# Patient Record
Sex: Female | Born: 1935 | Race: White | Hispanic: No | Smoking: Never smoker
Health system: Southern US, Community
[De-identification: ages and names within clinical notes are randomized; demographics above are authoritative.]

## PROBLEM LIST (undated history)

## (undated) DIAGNOSIS — C439 Malignant melanoma of skin, unspecified: Secondary | ICD-10-CM

## (undated) DIAGNOSIS — Z950 Presence of cardiac pacemaker: Secondary | ICD-10-CM

## (undated) DIAGNOSIS — K130 Diseases of lips: Secondary | ICD-10-CM

## (undated) DIAGNOSIS — M810 Age-related osteoporosis without current pathological fracture: Secondary | ICD-10-CM

## (undated) DIAGNOSIS — I499 Cardiac arrhythmia, unspecified: Secondary | ICD-10-CM

## (undated) DIAGNOSIS — L439 Lichen planus, unspecified: Secondary | ICD-10-CM

## (undated) DIAGNOSIS — F419 Anxiety disorder, unspecified: Secondary | ICD-10-CM

## (undated) DIAGNOSIS — I4892 Unspecified atrial flutter: Secondary | ICD-10-CM

## (undated) DIAGNOSIS — K219 Gastro-esophageal reflux disease without esophagitis: Secondary | ICD-10-CM

## (undated) DIAGNOSIS — R001 Bradycardia, unspecified: Secondary | ICD-10-CM

## (undated) DIAGNOSIS — I10 Essential (primary) hypertension: Secondary | ICD-10-CM

## (undated) HISTORY — DX: Malignant melanoma of skin, unspecified: C43.9

## (undated) HISTORY — DX: Lichen planus, unspecified: L43.9

## (undated) HISTORY — PX: TUBAL LIGATION: SHX77

## (undated) HISTORY — PX: OTHER SURGICAL HISTORY: SHX169

## (undated) HISTORY — DX: Bradycardia, unspecified: R00.1

## (undated) HISTORY — DX: Age-related osteoporosis without current pathological fracture: M81.0

## (undated) HISTORY — PX: HEMORROIDECTOMY: SUR656

## (undated) HISTORY — DX: Diseases of lips: K13.0

## (undated) HISTORY — PX: BACK SURGERY: SHX140

## (undated) HISTORY — PX: INSERT / REPLACE / REMOVE PACEMAKER: SUR710

## (undated) HISTORY — PX: TONSILLECTOMY: SUR1361

## (undated) HISTORY — DX: Essential (primary) hypertension: I10

## (undated) HISTORY — PX: EYE SURGERY: SHX253

## (undated) HISTORY — PX: FRACTURE SURGERY: SHX138

---
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
## 1978-04-07 HISTORY — PX: HERNIA REPAIR: SHX51

## 1996-04-07 HISTORY — PX: OTHER SURGICAL HISTORY: SHX169

## 1999-04-08 DIAGNOSIS — C439 Malignant melanoma of skin, unspecified: Secondary | ICD-10-CM

## 1999-04-08 HISTORY — DX: Malignant melanoma of skin, unspecified: C43.9

## 2004-01-17 ENCOUNTER — Ambulatory Visit: Payer: Self-pay | Admitting: Internal Medicine

## 2004-02-06 ENCOUNTER — Ambulatory Visit: Payer: Self-pay | Admitting: Gastroenterology

## 2004-02-07 ENCOUNTER — Ambulatory Visit: Payer: Self-pay | Admitting: Gastroenterology

## 2004-02-21 ENCOUNTER — Ambulatory Visit: Payer: Self-pay | Admitting: Gastroenterology

## 2004-04-05 ENCOUNTER — Ambulatory Visit: Payer: Self-pay | Admitting: Internal Medicine

## 2004-04-07 ENCOUNTER — Ambulatory Visit: Payer: Self-pay | Admitting: Internal Medicine

## 2004-05-08 ENCOUNTER — Ambulatory Visit: Payer: Self-pay | Admitting: Internal Medicine

## 2005-01-01 ENCOUNTER — Ambulatory Visit: Payer: Self-pay | Admitting: Internal Medicine

## 2005-04-07 DIAGNOSIS — R001 Bradycardia, unspecified: Secondary | ICD-10-CM

## 2005-04-07 HISTORY — PX: OTHER SURGICAL HISTORY: SHX169

## 2005-04-07 HISTORY — DX: Bradycardia, unspecified: R00.1

## 2005-04-07 HISTORY — PX: KYPHOPLASTY: SHX5884

## 2005-04-07 HISTORY — PX: PACEMAKER PLACEMENT: SHX43

## 2005-06-09 ENCOUNTER — Ambulatory Visit: Payer: Self-pay | Admitting: Unknown Physician Specialty

## 2005-06-10 ENCOUNTER — Other Ambulatory Visit: Payer: Self-pay

## 2005-06-11 ENCOUNTER — Inpatient Hospital Stay: Payer: Self-pay | Admitting: Unknown Physician Specialty

## 2005-10-03 ENCOUNTER — Ambulatory Visit: Payer: Self-pay | Admitting: Internal Medicine

## 2006-01-20 ENCOUNTER — Ambulatory Visit: Payer: Self-pay | Admitting: Internal Medicine

## 2006-01-21 ENCOUNTER — Ambulatory Visit: Payer: Self-pay | Admitting: Internal Medicine

## 2006-02-11 ENCOUNTER — Other Ambulatory Visit: Payer: Self-pay

## 2006-02-11 ENCOUNTER — Inpatient Hospital Stay: Payer: Self-pay | Admitting: Cardiology

## 2006-02-12 ENCOUNTER — Other Ambulatory Visit: Payer: Self-pay

## 2006-03-19 ENCOUNTER — Ambulatory Visit: Payer: Self-pay | Admitting: Internal Medicine

## 2006-05-05 ENCOUNTER — Ambulatory Visit: Payer: Self-pay | Admitting: Internal Medicine

## 2006-08-06 ENCOUNTER — Ambulatory Visit: Payer: Self-pay | Admitting: Oncology

## 2006-08-28 ENCOUNTER — Ambulatory Visit: Payer: Self-pay | Admitting: Oncology

## 2006-09-04 ENCOUNTER — Ambulatory Visit: Payer: Self-pay | Admitting: Oncology

## 2006-09-06 ENCOUNTER — Ambulatory Visit: Payer: Self-pay | Admitting: Oncology

## 2006-09-07 ENCOUNTER — Ambulatory Visit: Payer: Self-pay | Admitting: Oncology

## 2006-10-06 ENCOUNTER — Ambulatory Visit: Payer: Self-pay | Admitting: Oncology

## 2006-11-06 ENCOUNTER — Ambulatory Visit: Payer: Self-pay | Admitting: Oncology

## 2006-12-07 ENCOUNTER — Ambulatory Visit: Payer: Self-pay | Admitting: Oncology

## 2007-01-06 ENCOUNTER — Ambulatory Visit: Payer: Self-pay | Admitting: Oncology

## 2007-01-25 ENCOUNTER — Ambulatory Visit: Payer: Self-pay | Admitting: Internal Medicine

## 2007-02-01 ENCOUNTER — Ambulatory Visit: Payer: Self-pay | Admitting: Oncology

## 2007-02-06 ENCOUNTER — Ambulatory Visit: Payer: Self-pay | Admitting: Oncology

## 2007-02-18 ENCOUNTER — Ambulatory Visit: Payer: Self-pay | Admitting: Oncology

## 2007-03-08 ENCOUNTER — Ambulatory Visit: Payer: Self-pay | Admitting: Oncology

## 2007-03-18 ENCOUNTER — Ambulatory Visit: Payer: Self-pay | Admitting: Radiation Oncology

## 2007-04-08 ENCOUNTER — Ambulatory Visit: Payer: Self-pay | Admitting: Oncology

## 2007-04-08 HISTORY — PX: CHOLECYSTECTOMY: SHX55

## 2007-04-12 ENCOUNTER — Ambulatory Visit: Payer: Self-pay | Admitting: Oncology

## 2007-05-09 ENCOUNTER — Ambulatory Visit: Payer: Self-pay | Admitting: Oncology

## 2007-05-19 ENCOUNTER — Ambulatory Visit: Payer: Self-pay | Admitting: Oncology

## 2007-06-06 ENCOUNTER — Ambulatory Visit: Payer: Self-pay | Admitting: Oncology

## 2007-07-30 ENCOUNTER — Other Ambulatory Visit: Payer: Self-pay

## 2007-07-30 ENCOUNTER — Emergency Department: Payer: Self-pay | Admitting: Emergency Medicine

## 2007-08-06 ENCOUNTER — Ambulatory Visit: Payer: Self-pay | Admitting: Oncology

## 2007-09-01 ENCOUNTER — Ambulatory Visit: Payer: Self-pay | Admitting: Oncology

## 2007-09-06 ENCOUNTER — Ambulatory Visit: Payer: Self-pay | Admitting: Oncology

## 2007-10-06 ENCOUNTER — Ambulatory Visit: Payer: Self-pay | Admitting: Oncology

## 2007-10-14 ENCOUNTER — Inpatient Hospital Stay: Payer: Self-pay | Admitting: Specialist

## 2007-10-14 ENCOUNTER — Other Ambulatory Visit: Payer: Self-pay

## 2007-11-06 ENCOUNTER — Ambulatory Visit: Payer: Self-pay | Admitting: Oncology

## 2007-12-02 ENCOUNTER — Ambulatory Visit: Payer: Self-pay | Admitting: Oncology

## 2007-12-07 ENCOUNTER — Ambulatory Visit: Payer: Self-pay | Admitting: Oncology

## 2008-01-26 ENCOUNTER — Ambulatory Visit: Payer: Self-pay | Admitting: Internal Medicine

## 2008-06-01 ENCOUNTER — Ambulatory Visit: Payer: Self-pay | Admitting: Oncology

## 2008-06-05 ENCOUNTER — Ambulatory Visit: Payer: Self-pay | Admitting: Oncology

## 2008-07-06 ENCOUNTER — Ambulatory Visit: Payer: Self-pay | Admitting: Oncology

## 2008-07-12 ENCOUNTER — Ambulatory Visit: Payer: Self-pay | Admitting: Oncology

## 2008-08-05 ENCOUNTER — Ambulatory Visit: Payer: Self-pay | Admitting: Oncology

## 2008-09-05 ENCOUNTER — Ambulatory Visit: Payer: Self-pay | Admitting: Radiation Oncology

## 2008-10-02 ENCOUNTER — Ambulatory Visit: Payer: Self-pay | Admitting: Oncology

## 2008-10-05 ENCOUNTER — Ambulatory Visit: Payer: Self-pay | Admitting: Radiation Oncology

## 2008-11-05 ENCOUNTER — Ambulatory Visit: Payer: Self-pay | Admitting: Oncology

## 2008-12-01 ENCOUNTER — Ambulatory Visit: Payer: Self-pay | Admitting: Oncology

## 2008-12-06 ENCOUNTER — Ambulatory Visit: Payer: Self-pay | Admitting: Oncology

## 2009-01-29 ENCOUNTER — Ambulatory Visit: Payer: Self-pay | Admitting: Internal Medicine

## 2009-03-08 ENCOUNTER — Encounter: Payer: Self-pay | Admitting: Internal Medicine

## 2009-04-07 ENCOUNTER — Ambulatory Visit: Payer: Self-pay | Admitting: Oncology

## 2009-04-11 ENCOUNTER — Encounter: Payer: Self-pay | Admitting: Internal Medicine

## 2009-04-26 ENCOUNTER — Ambulatory Visit: Payer: Self-pay | Admitting: Oncology

## 2009-05-08 ENCOUNTER — Ambulatory Visit: Payer: Self-pay | Admitting: Oncology

## 2009-05-08 ENCOUNTER — Encounter: Payer: Self-pay | Admitting: Internal Medicine

## 2009-10-05 ENCOUNTER — Ambulatory Visit: Payer: Self-pay | Admitting: Oncology

## 2009-10-18 ENCOUNTER — Ambulatory Visit: Payer: Self-pay | Admitting: Oncology

## 2009-10-24 ENCOUNTER — Ambulatory Visit: Payer: Self-pay | Admitting: Oncology

## 2009-11-05 ENCOUNTER — Ambulatory Visit: Payer: Self-pay | Admitting: Oncology

## 2009-12-06 ENCOUNTER — Ambulatory Visit: Payer: Self-pay | Admitting: Oncology

## 2010-02-06 ENCOUNTER — Ambulatory Visit: Payer: Self-pay | Admitting: Internal Medicine

## 2010-04-30 ENCOUNTER — Ambulatory Visit: Payer: Self-pay | Admitting: Oncology

## 2010-05-08 ENCOUNTER — Ambulatory Visit: Payer: Self-pay | Admitting: Oncology

## 2010-05-29 ENCOUNTER — Emergency Department: Payer: Self-pay | Admitting: Emergency Medicine

## 2010-06-07 ENCOUNTER — Ambulatory Visit: Payer: Self-pay | Admitting: Unknown Physician Specialty

## 2010-10-28 ENCOUNTER — Ambulatory Visit: Payer: Self-pay | Admitting: Oncology

## 2010-10-31 ENCOUNTER — Ambulatory Visit: Payer: Self-pay | Admitting: Oncology

## 2010-11-06 ENCOUNTER — Ambulatory Visit: Payer: Self-pay | Admitting: Oncology

## 2011-05-14 ENCOUNTER — Ambulatory Visit: Payer: Self-pay | Admitting: Oncology

## 2011-05-14 LAB — COMPREHENSIVE METABOLIC PANEL
Alkaline Phosphatase: 105 U/L (ref 50–136)
Anion Gap: 4 — ABNORMAL LOW (ref 7–16)
BUN: 14 mg/dL (ref 7–18)
Bilirubin,Total: 0.3 mg/dL (ref 0.2–1.0)
Calcium, Total: 8.8 mg/dL (ref 8.5–10.1)
Chloride: 99 mmol/L (ref 98–107)
Co2: 34 mmol/L — ABNORMAL HIGH (ref 21–32)
Creatinine: 0.93 mg/dL (ref 0.60–1.30)
EGFR (African American): >60
EGFR (Non-African Amer.): >60
Glucose: 98 mg/dL (ref 65–99)
Osmolality: 274 (ref 275–301)
SGOT(AST): 25 U/L (ref 15–37)
SGPT (ALT): 25 U/L
Sodium: 137 mmol/L (ref 136–145)

## 2011-05-14 LAB — CBC CANCER CENTER
Basophil #: 0 x10 3/mm (ref 0.0–0.1)
Eosinophil %: 1.4 %
HGB: 13.6 g/dL (ref 12.0–16.0)
Lymphocyte %: 29.2 %
MCHC: 33.7 g/dL (ref 32.0–36.0)
Neutrophil #: 3.8 x10 3/mm (ref 1.4–6.5)
Neutrophil %: 60.6 %
Platelet: 236 x10 3/mm (ref 150–440)
RBC: 4.39 10*6/uL (ref 3.80–5.20)

## 2011-05-15 ENCOUNTER — Ambulatory Visit: Payer: Self-pay | Admitting: Internal Medicine

## 2011-06-06 ENCOUNTER — Ambulatory Visit: Payer: Self-pay | Admitting: Oncology

## 2011-11-18 ENCOUNTER — Ambulatory Visit: Payer: Self-pay | Admitting: Oncology

## 2011-11-18 LAB — CBC CANCER CENTER
Basophil #: 0 x10 3/mm (ref 0.0–0.1)
Basophil %: 0.7 %
HCT: 39.7 % (ref 35.0–47.0)
HGB: 12.9 g/dL (ref 12.0–16.0)
Lymphocyte %: 22.3 %
MCH: 29.8 pg (ref 26.0–34.0)
MCV: 92 fL (ref 80–100)
Monocyte %: 8.3 %
Neutrophil #: 4.6 x10 3/mm (ref 1.4–6.5)
Neutrophil %: 66.1 %
RBC: 4.33 10*6/uL (ref 3.80–5.20)
WBC: 7 x10 3/mm (ref 3.6–11.0)

## 2011-11-18 LAB — COMPREHENSIVE METABOLIC PANEL
Albumin: 3.3 g/dL — ABNORMAL LOW (ref 3.4–5.0)
Alkaline Phosphatase: 105 U/L (ref 50–136)
Anion Gap: 3 — ABNORMAL LOW (ref 7–16)
BUN: 15 mg/dL (ref 7–18)
Calcium, Total: 8.9 mg/dL (ref 8.5–10.1)
Co2: 32 mmol/L (ref 21–32)
Creatinine: 0.9 mg/dL (ref 0.60–1.30)
Glucose: 92 mg/dL (ref 65–99)
Sodium: 134 mmol/L — ABNORMAL LOW (ref 136–145)

## 2011-12-07 ENCOUNTER — Ambulatory Visit: Payer: Self-pay | Admitting: Oncology

## 2011-12-09 ENCOUNTER — Emergency Department: Payer: Self-pay | Admitting: Emergency Medicine

## 2011-12-09 LAB — CBC
MCH: 31 pg (ref 26.0–34.0)
MCHC: 34.4 g/dL (ref 32.0–36.0)
RDW: 14.2 % (ref 11.5–14.5)
WBC: 14.5 10*3/uL — ABNORMAL HIGH (ref 3.6–11.0)

## 2011-12-09 LAB — COMPREHENSIVE METABOLIC PANEL
Alkaline Phosphatase: 87 U/L (ref 50–136)
Anion Gap: 8 (ref 7–16)
BUN: 19 mg/dL — ABNORMAL HIGH (ref 7–18)
Bilirubin,Total: 0.5 mg/dL (ref 0.2–1.0)
Calcium, Total: 8.8 mg/dL (ref 8.5–10.1)
Chloride: 100 mmol/L (ref 98–107)
Co2: 27 mmol/L (ref 21–32)
Creatinine: 0.88 mg/dL (ref 0.60–1.30)
EGFR (African American): >60
EGFR (Non-African Amer.): 58 — ABNORMAL LOW
Osmolality: 276 (ref 275–301)
Potassium: 3.7 mmol/L (ref 3.5–5.1)
Sodium: 135 mmol/L — ABNORMAL LOW (ref 136–145)
Total Protein: 7.1 g/dL (ref 6.4–8.2)

## 2011-12-09 LAB — URINALYSIS, COMPLETE
Nitrite: NEGATIVE
Ph: 6 (ref 4.5–8.0)
Protein: 30

## 2011-12-09 LAB — LIPASE, BLOOD: Lipase: 169 U/L (ref 73–393)

## 2011-12-30 ENCOUNTER — Ambulatory Visit: Payer: Self-pay | Admitting: Oncology

## 2012-01-01 ENCOUNTER — Ambulatory Visit: Payer: Self-pay | Admitting: Oncology

## 2012-01-06 ENCOUNTER — Ambulatory Visit: Payer: Self-pay | Admitting: Oncology

## 2012-01-22 ENCOUNTER — Emergency Department: Payer: Self-pay | Admitting: Emergency Medicine

## 2012-01-22 LAB — COMPREHENSIVE METABOLIC PANEL
BUN: 6 mg/dL — ABNORMAL LOW (ref 7–18)
Chloride: 98 mmol/L (ref 98–107)
Co2: 25 mmol/L (ref 21–32)
Creatinine: 0.62 mg/dL (ref 0.60–1.30)
EGFR (African American): >60
EGFR (Non-African Amer.): >60
Osmolality: 269 (ref 275–301)
SGOT(AST): 33 U/L (ref 15–37)
SGPT (ALT): 28 U/L (ref 12–78)
Total Protein: 7.9 g/dL (ref 6.4–8.2)

## 2012-01-22 LAB — CBC
HGB: 13.5 g/dL (ref 12.0–16.0)
MCH: 30.4 pg (ref 26.0–34.0)
MCHC: 33.7 g/dL (ref 32.0–36.0)
MCV: 90 fL (ref 80–100)
RDW: 13.5 % (ref 11.5–14.5)

## 2012-01-23 LAB — URINALYSIS, COMPLETE
Bacteria: NONE SEEN
Bilirubin,UR: NEGATIVE
Blood: NEGATIVE
Glucose,UR: NEGATIVE mg/dL (ref 0–75)
Specific Gravity: 1.014 (ref 1.003–1.030)
Squamous Epithelial: <1

## 2012-01-23 LAB — LIPASE, BLOOD: Lipase: 163 U/L

## 2012-01-26 ENCOUNTER — Telehealth: Payer: Self-pay | Admitting: Internal Medicine

## 2012-01-26 NOTE — Telephone Encounter (Signed)
Pt spouse came in to see if we could see Lori Henson before 11/19 She has been er Thursday for stomach pain The had ct scan/labs/ Pt has had pancreitatis in the past

## 2012-01-27 NOTE — Telephone Encounter (Signed)
If increased pain and loose bowel movement now, I would rec eval today to make sure not starting to get an obstruction.  rec to acc now for eval then i can follow up with her.  Thanks.

## 2012-01-27 NOTE — Telephone Encounter (Signed)
Pt states that she is know longer having pain and that she is now having bowel movements. Pt states that now she no longer has an appetite and wants to see you as soon as you can fit her in.

## 2012-01-27 NOTE — Telephone Encounter (Signed)
Need to know what is going on with her now to see how soon visit needs to be.  Is she having problems, pain, etc now.

## 2012-01-27 NOTE — Telephone Encounter (Signed)
Patient seen in ED on 01/23/2012 had a fecal impaction, was given and enemia.  Enemia given this Saturday, no appetite, no bowel movement today, a lot of gas, loose watery bowel movement on 01/26/2012, pain is in upper middle abdominal area.

## 2012-01-28 NOTE — Telephone Encounter (Signed)
I can see her 02/06/12 at 11:30 - let me know if she does not feel comfortable waiting until then.  Thanks.

## 2012-02-02 NOTE — Telephone Encounter (Signed)
Pt called back she will be here 11/1

## 2012-02-02 NOTE — Telephone Encounter (Signed)
Pt has conflict that day and cannot come

## 2012-02-06 ENCOUNTER — Ambulatory Visit (INDEPENDENT_AMBULATORY_CARE_PROVIDER_SITE_OTHER): Payer: Medicare Other | Admitting: Internal Medicine

## 2012-02-06 ENCOUNTER — Encounter: Payer: Self-pay | Admitting: Internal Medicine

## 2012-02-06 VITALS — BP 122/74 | Temp 97.8°F | Ht 67.0 in | Wt 120.0 lb

## 2012-02-06 DIAGNOSIS — K5900 Constipation, unspecified: Secondary | ICD-10-CM | POA: Insufficient documentation

## 2012-02-06 DIAGNOSIS — C439 Malignant melanoma of skin, unspecified: Secondary | ICD-10-CM

## 2012-02-06 DIAGNOSIS — K5909 Other constipation: Secondary | ICD-10-CM

## 2012-02-06 DIAGNOSIS — Z23 Encounter for immunization: Secondary | ICD-10-CM

## 2012-02-06 DIAGNOSIS — I10 Essential (primary) hypertension: Secondary | ICD-10-CM

## 2012-02-06 NOTE — Patient Instructions (Addendum)
It was nice seeing you today.  I am sorry you have been having problems with your bowels.  We will send in the refill for the Lactulose.  Let me know if you have any problems.

## 2012-02-07 ENCOUNTER — Encounter: Payer: Self-pay | Admitting: Internal Medicine

## 2012-02-07 DIAGNOSIS — C439 Malignant melanoma of skin, unspecified: Secondary | ICD-10-CM | POA: Insufficient documentation

## 2012-02-07 DIAGNOSIS — I10 Essential (primary) hypertension: Secondary | ICD-10-CM | POA: Insufficient documentation

## 2012-02-07 NOTE — Assessment & Plan Note (Signed)
Currently doing well.  Sees Dr Choksi.  Had PET scan 12/30/11 - negative.     

## 2012-02-07 NOTE — Assessment & Plan Note (Signed)
Persistent issue.  See above.  Seeing GI.  Using Lactulose now.  Continue.  Discussed the need for intermittent enemas.

## 2012-02-07 NOTE — Assessment & Plan Note (Signed)
Blood pressure under good control.  Same meds.  Follow.  

## 2012-02-07 NOTE — Progress Notes (Signed)
  Subjective:    Patient ID: Lori Henson, female    DOB: 01-22-1920, 78 y.o.   MRN: [id number]  HPI 78 year old female with past history of chronic constipation, malignant melanoma and hypertension who comes in today for an ER follow up.  Was originally seen in the ER 12/09/11 for increased abdominal pain. Had abdominal CT scan - normal.  Diagnosed with UTI.  Took Cipro.  Saw [facility] 12/11/11.  Abdominal pain was better, but she was weak and with increased fatigue.  Instructed to eat prunes, applesauce and power pudding.   Also recommended a colonoscopy.  Saw Dr Randa Lynn on 12/16/11.  Advised against the colonoscopy and instructed her to take Lactulose.  The lactulose appears to help some  Has had some weight loss.  Appetite decreased.  Had follow up with [facility].  No changes made.  Increased abdominal pain again and had to go back to the ER 01/23/12.  CT then revealed an impaction.  Instructed to use Mag citrate and enemas.  Now is using intermittent enemas.  Having some bowel movements now.  Appetite has improved.  Eating better.  No abdominal pain currently.  Had PET scan 12/30/11 - clear.    Past Medical History  Diagnosis Date  . Malignant melanoma 2001    right leg, s/p radiation  . Hypertension   . 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  No chest pain, tightness or palpitations.  No increased shortness of breath, cough or congestion.  No nausea or vomiting currently.   No abdominal pain or cramping currently.  No BRBPR or melana. Bowels are moving some better.  Has had problems with chronic constipation.  No urine change.        Objective:   Physical Exam Filed Vitals:   02/06/12 1152  BP: 122/74  Temp: 97.8 F (50.72 C)   79 year old female in no acute distress.   HEENT:  Nares - clear.  OP- without lesions or erythema.  NECK:  Supple, nontender.  No audible bruit.   HEART:  Appears to be regular. LUNGS:   Without crackles or wheezing audible.  Respirations even and unlabored.   RADIAL PULSE:  Equal bilaterally.  ABDOMEN:  Soft, nontender.  No audible abdominal bruit.  Bowel sounds present and normal.   EXTREMITIES:  No increased edema to be present.                     Assessment & Plan:  CARDIOVASCULAR.  Is s/p pacemaker placement.  Doing well.  Follow.   HEALTH MAINTENANCE.  Will obtain records for review.  Flu shot given today.

## 2012-02-18 ENCOUNTER — Telehealth: Payer: Self-pay | Admitting: Internal Medicine

## 2012-02-18 NOTE — Telephone Encounter (Signed)
Pt had refills on Lactulose already at the pharmacy.  I called in amlodipine 2.5mg  q day to pharmacy.  ([facility]).  (called in #[phone] with 3 refills).

## 2012-02-18 NOTE — Telephone Encounter (Signed)
Pt is needing Amlodipine Besylate 2.5 mg tablets. And Lactulose 10 mg. She uses [facility] on [address]

## 2012-02-19 ENCOUNTER — Telehealth: Payer: Self-pay | Admitting: *Deleted

## 2012-02-19 NOTE — Telephone Encounter (Signed)
Prescriptions previously called in

## 2012-02-24 ENCOUNTER — Encounter: Payer: Self-pay | Admitting: Internal Medicine

## 2012-02-24 ENCOUNTER — Ambulatory Visit (INDEPENDENT_AMBULATORY_CARE_PROVIDER_SITE_OTHER): Payer: Medicare Other | Admitting: Internal Medicine

## 2012-02-24 VITALS — BP 144/83 | HR 89 | Temp 97.9°F | Ht 66.0 in | Wt 121.5 lb

## 2012-02-24 DIAGNOSIS — C439 Malignant melanoma of skin, unspecified: Secondary | ICD-10-CM

## 2012-02-24 DIAGNOSIS — K5900 Constipation, unspecified: Secondary | ICD-10-CM

## 2012-02-24 DIAGNOSIS — I10 Essential (primary) hypertension: Secondary | ICD-10-CM

## 2012-02-24 DIAGNOSIS — K5909 Other constipation: Secondary | ICD-10-CM

## 2012-02-24 NOTE — Patient Instructions (Addendum)
It was nice seeing you again.  I am glad you are doing better and eating better.  Let me know if you need anything.

## 2012-03-07 ENCOUNTER — Encounter: Payer: Self-pay | Admitting: Internal Medicine

## 2012-03-07 NOTE — Assessment & Plan Note (Signed)
Blood pressure has been under good control.  Same meds.  Check metabolic panel with next labs.    

## 2012-03-07 NOTE — Assessment & Plan Note (Addendum)
See previous note.  Doing better on the Lactulose.  Follow.  Colonoscopy 02/06/04 normal.  I spent more than 25 minutes with the patient and more than 50% of the visit was spent in consultation regarding the above.

## 2012-03-07 NOTE — Progress Notes (Signed)
  Subjective:    Patient ID: Lori Henson, female    DOB: 1920/02/24, 78 y.o.   MRN: [id number]  HPI 78 year old female with past history of lichen planus, hypertension, osteoporosis, malignant melanoma (followed at the cancer center) and bradycardia (s/p pacemaker placement).  She comes in today for a scheduled follow up.  She states she is doing better.  Appetite is better.  Bowels (lately) doing better.  Has chronic constipation.  Using lactulose regularly now and this is helping.  No chest pain or tightness.  No nausea or vomiting.    Past Medical History  Diagnosis Date  . Malignant melanoma 2001    right leg, s/p radiation, recurrence 2008  . Hypertension   . 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 Lichen planus   . Angular cheilitis   . Osteoporosis     fosamax, compression fx s/p kyphoplasty    Outpatient Encounter Prescriptions as of 02/24/2012  Medication Sig Dispense Refill  . amLODipine (NORVASC) 2.5 MG tablet Take 2.5 mg by mouth daily.      . calcium carbonate (OS-CAL) 600 MG TABS Take 600 mg by mouth 2 (two) times daily with a meal.      . Casanthranol-Docusate Sodium 30-100 MG CAPS Take by mouth daily.       . clonazePAM (KLONOPIN) 0.5 MG tablet 0.5 mg. Takes 1 1/2 tablet qhs      . mirtazapine (REMERON) 15 MG tablet Take 15 mg by mouth at bedtime.      . Multiple Vitamins-Minerals (ICAPS) CAPS Take by mouth 2 (two) times daily.         Review of Systems Patient denies any headache, lightheadedness or dizziness.  No significant sinus or allergy symptoms.  No chest pain, tightness or palpitations.  No increased shortness of breath, cough or congestion.  No nausea or vomiting.  No abdominal pain or cramping currently.    Has chronic constipation - better on Lactulose.  No BRBPR or melana.  No urine change.        Objective:   Physical Exam Filed Vitals:   02/24/12 1138  BP: 144/83  Pulse: 89  Temp: 97.9 F (36.6 C)   Blood pressure recheck:  27/48  78 year old  female in no acute distress.   HEENT:  Nares - clear.  OP- without lesions or erythema.  NECK:  Supple, nontender.  No audible bruit.   HEART:  Appears to be regular. LUNGS:  Without crackles or wheezing audible.  Respirations even and unlabored.   RADIAL PULSE:  Equal bilaterally.  ABDOMEN:  Soft, nontender.  No audible abdominal bruit.   EXTREMITIES:  No increased edema to be present.                    Assessment & Plan:  MSK.  Stable.    PULMONARY.  Breathing stable.  Follow.   HEALTH MAINTENANCE.  Will schedule her for a physical next visit.  Obtain outside records for review.  Colonoscopy 02/06/04 normal.

## 2012-03-07 NOTE — Assessment & Plan Note (Signed)
[facility]  Stable.     

## 2012-03-15 ENCOUNTER — Telehealth: Payer: Self-pay | Admitting: Internal Medicine

## 2012-03-15 NOTE — Telephone Encounter (Signed)
Dr. Claudie Fisherman wants the patient to have blood work done at this office . CBC, Vit B-12 level, folic acid level, TSH ,  DX Code 780.79 . Please call the patient with an appointment this week to get the results back this week.

## 2012-03-17 NOTE — Telephone Encounter (Signed)
Called patient to let her know. Made appointment for patient.

## 2012-03-18 ENCOUNTER — Other Ambulatory Visit (INDEPENDENT_AMBULATORY_CARE_PROVIDER_SITE_OTHER): Payer: Medicare Other

## 2012-03-18 ENCOUNTER — Telehealth: Payer: Self-pay | Admitting: *Deleted

## 2012-03-18 DIAGNOSIS — E538 Deficiency of other specified B group vitamins: Secondary | ICD-10-CM

## 2012-03-18 DIAGNOSIS — R5383 Other fatigue: Secondary | ICD-10-CM

## 2012-03-18 DIAGNOSIS — M79606 Pain in leg, unspecified: Secondary | ICD-10-CM

## 2012-03-18 DIAGNOSIS — R5381 Other malaise: Secondary | ICD-10-CM

## 2012-03-18 DIAGNOSIS — G589 Mononeuropathy, unspecified: Secondary | ICD-10-CM

## 2012-03-18 DIAGNOSIS — Z139 Encounter for screening, unspecified: Secondary | ICD-10-CM

## 2012-03-18 DIAGNOSIS — M79609 Pain in unspecified limb: Secondary | ICD-10-CM

## 2012-03-18 DIAGNOSIS — E889 Metabolic disorder, unspecified: Secondary | ICD-10-CM

## 2012-03-18 DIAGNOSIS — C439 Malignant melanoma of skin, unspecified: Secondary | ICD-10-CM

## 2012-03-18 LAB — CBC WITH DIFFERENTIAL/PLATELET
Basophils Absolute: 0.1 10*3/uL (ref 0.0–0.1)
Eosinophils Relative: 1.2 % (ref 0.0–5.0)
HCT: 37.8 % (ref 36.0–46.0)
Lymphs Abs: 1.2 10*3/uL (ref 0.7–4.0)
Monocytes Relative: 9.7 % (ref 3.0–12.0)
Neutrophils Relative %: 62 % (ref 43.0–77.0)
Platelets: 197 10*3/uL (ref 150.0–400.0)
RDW: 13.8 % (ref 11.5–14.6)
WBC: 4.8 10*3/uL (ref 4.5–10.5)

## 2012-03-18 LAB — FOLATE: Folate: >24.8 ng/mL (ref >5.9)

## 2012-03-18 LAB — TSH: TSH: 1.55 u[IU]/mL (ref 0.35–5.50)

## 2012-03-18 LAB — VITAMIN B12: Vitamin B-12: 695 pg/mL (ref 211–911)

## 2012-03-18 NOTE — Telephone Encounter (Signed)
What labs and diagnostic code would you like for this pt? The pt also wants to know if we can get her lab results faxed to [phone] ?

## 2012-03-18 NOTE — Telephone Encounter (Signed)
Per 03/15/12 phone message - Dr Imogene Burn wanted the labs.  He wanted cbc, B12, folic acid and tsh - dx 780.79.

## 2012-03-19 ENCOUNTER — Telehealth: Payer: Self-pay | Admitting: Internal Medicine

## 2012-03-19 NOTE — Telephone Encounter (Signed)
Labs faxed over

## 2012-03-19 NOTE — Telephone Encounter (Signed)
I notified pt of her lab results via My Chart messaging.  Her labs need to be faxed to Dr Imogene Burn this week/today.  The fax number she left for [place] is [phone].

## 2012-05-07 ENCOUNTER — Encounter: Payer: Self-pay | Admitting: Internal Medicine

## 2012-05-10 ENCOUNTER — Telehealth: Payer: Self-pay | Admitting: Internal Medicine

## 2012-05-10 NOTE — Telephone Encounter (Signed)
Patient Information:  Caller Name: Greysen  Phone: [phone]  Patient: Lori Henson, Lori Henson  Gender: Female  DOB: 06/14/1919  Age: 79 Years  PCP: Dale Woodlawn  Office Follow Up:  Does the office need to follow up with this patient?: No  Instructions For The Office: N/A   Symptoms  Reason For Call & Symptoms: Reports increase in blood pressure and swollen ankles. Blood pressure: 136/78 Pulse: 64. Reports ankles are becoming uncomfortable due to swelling. Patient feels like her medication needs to be adjusted.  Drinda Butts is new medication for depression.  Reviewed Health History In EMR: Yes  Reviewed Medications In EMR: Yes  Reviewed Allergies In EMR: Yes  Reviewed Surgeries / Procedures: Yes  Date of Onset of Symptoms: 05/07/2012  Guideline(s) Used:  High Blood Pressure  Disposition Per Guideline:   See Today in Office  Reason For Disposition Reached:   Patient wants to be seen  Advice Given:  N/A  Appointment Scheduled:  05/11/2012 09:45:00 Appointment Scheduled Provider:  Dale [place]

## 2012-05-11 ENCOUNTER — Ambulatory Visit: Payer: Self-pay | Admitting: Internal Medicine

## 2012-05-11 ENCOUNTER — Ambulatory Visit (INDEPENDENT_AMBULATORY_CARE_PROVIDER_SITE_OTHER): Payer: Medicare Other | Admitting: Adult Health

## 2012-05-11 ENCOUNTER — Encounter: Payer: Self-pay | Admitting: Adult Health

## 2012-05-11 VITALS — BP 140/78 | HR 87 | Temp 97.8°F | Resp 14 | Ht 66.0 in | Wt 126.5 lb

## 2012-05-11 DIAGNOSIS — R609 Edema, unspecified: Secondary | ICD-10-CM | POA: Insufficient documentation

## 2012-05-11 DIAGNOSIS — Z79899 Other long term (current) drug therapy: Secondary | ICD-10-CM

## 2012-05-11 DIAGNOSIS — Z5181 Encounter for therapeutic drug level monitoring: Secondary | ICD-10-CM

## 2012-05-11 DIAGNOSIS — I10 Essential (primary) hypertension: Secondary | ICD-10-CM

## 2012-05-11 DIAGNOSIS — K5900 Constipation, unspecified: Secondary | ICD-10-CM

## 2012-05-11 DIAGNOSIS — R600 Localized edema: Secondary | ICD-10-CM | POA: Insufficient documentation

## 2012-05-11 DIAGNOSIS — K5909 Other constipation: Secondary | ICD-10-CM

## 2012-05-11 LAB — BASIC METABOLIC PANEL
BUN: 9 mg/dL (ref 6–23)
Creatinine, Ser: 0.7 mg/dL (ref 0.4–1.2)
GFR: 83.49 mL/min (ref >60.00)
Potassium: 5.2 mEq/L — ABNORMAL HIGH (ref 3.5–5.1)

## 2012-05-11 MED ORDER — LACTULOSE 20 GM/30ML PO SOLN: 30.0000 mL | Freq: Every day | ORAL | Status: DC

## 2012-05-11 MED ORDER — CHLORTHALIDONE 25 MG PO TABS: ORAL_TABLET | ORAL | Status: DC

## 2012-05-11 NOTE — Assessment & Plan Note (Signed)
New onset of LE edema. Patient takes Amlodipine for B/P. Suspect this medication to be cause. Will change therapy. D/C amlodipine.

## 2012-05-11 NOTE — Assessment & Plan Note (Signed)
D/C amlodipine 2/2 LE edema. Start chlorthalidone 12.5 mg daily and adjust as needed. Patient will continue to monitor B/P and report back in one week. We will make adjustments to the medication slowly if needed.

## 2012-05-11 NOTE — Patient Instructions (Addendum)
  Please stop the amlodipine. This medication can cause swelling in the lower extremities.   Please have your labs drawn prior to leaving the office. I am checking your electrolytes and kidney function prior to starting this new medication.  You will start chlorthalidone 12.5 mg daily. I have prescribed 25 mg tablets. Please cut them in half.  Monitor your B/P as you have been doing. Take the first measurement approximately 1 hour after you take your medication then take it again at midday and in the evening.  This medication has a diuretic effect. Please monitor for symptoms such as lightheadedness, dizziness. Change positions slowly.  We may need to adjust your medication to obtain optimum B/P.  Please call the office in one week to let us know what your B/P readings are.

## 2012-05-11 NOTE — Progress Notes (Signed)
  Subjective:    Patient ID: Lori Henson, female    DOB: 1919/10/26, 79 y.o.   MRN: [id number]  HPI  Patient is a pleasant 79 y/o female who presents to clinic today with c/o elevated B/P readings and swelling of ankles. Patient denies any shortness of breath or chest pain. She is currently on amlodipine for her B/P.  Patient is also requesting a refill on lactulose. She is taking this daily for constipation.   Current Outpatient Prescriptions on File Prior to Visit  Medication Sig Dispense Refill  . calcium carbonate (OS-CAL) 600 MG TABS Take 600 mg by mouth 2 (two) times daily with a meal.      . Casanthranol-Docusate Sodium 30-100 MG CAPS Take by mouth daily.       . clonazePAM (KLONOPIN) 0.5 MG tablet 0.5 mg. Takes 1 1/2 tablet qhs      . mirtazapine (REMERON) 15 MG tablet Take 15 mg by mouth at bedtime.      . Multiple Vitamins-Minerals (ICAPS) CAPS Take by mouth 2 (two) times daily.       . Vilazodone HCl (VIIBRYD) 20 MG TABS Take by mouth daily.      . chlorthalidone (HYGROTON) 25 MG tablet Please take 1/2 tablet in the morning.  30 tablet  3     Review of Systems  Constitutional: Negative.   Respiratory: Negative.   Cardiovascular: Positive for leg swelling.       B/P readings with 4 showing above goal. Systolic above 150 and diastolic above 90. All other readings are within goal. She has started to notice LE swelling mainly around ankles.  Neurological: Negative.   Psychiatric/Behavioral: Negative.     BP 140/78  Pulse 87  Temp 97.8 F (36.6 C) (Oral)  Resp 14  Ht 5\' 6"  (1.676 m)  Wt 126 lb 8 oz (57.38 kg)  BMI 20.42 kg/m2  SpO2 95%     Objective:   Physical Exam  Constitutional: She is oriented to person, place, and time.  Cardiovascular: Normal rate, regular rhythm and normal heart sounds.  Exam reveals no gallop.   No murmur heard.      Bilateral ankle edema. L > R.  B/P stable.  Pulmonary/Chest: Effort normal and breath sounds normal.  Abdominal: Soft.  Bowel sounds are normal.  Neurological: She is alert and oriented to person, place, and time.  Skin: Skin is warm and dry. No rash noted.  Psychiatric: She has a normal mood and affect. Her behavior is normal. Thought content normal.    BP 140/78  Pulse 87  Temp 97.8 F (36.6 C) (Oral)  Resp 14  Ht 5\' 6"  (1.676 m)  Wt 126 lb 8 oz (57.38 kg)  BMI 20.42 kg/m2  SpO2 95%     Assessment & Plan:

## 2012-05-11 NOTE — Assessment & Plan Note (Signed)
Stable with lactulose. Reordered medication.

## 2012-05-12 ENCOUNTER — Other Ambulatory Visit: Payer: Self-pay | Admitting: Adult Health

## 2012-05-12 DIAGNOSIS — E878 Other disorders of electrolyte and fluid balance, not elsewhere classified: Secondary | ICD-10-CM

## 2012-05-12 NOTE — Progress Notes (Signed)
  bmet showing slightly low sodium level and slightly elevated potassium. She was started on chlorthalidone for B/P which has tendency to decrease potassium. Recheck levels in ~ 2 weeks.

## 2012-05-19 ENCOUNTER — Telehealth: Payer: Self-pay | Admitting: General Practice

## 2012-05-19 NOTE — Telephone Encounter (Signed)
Pt called stating BP is coming down 124/75 at 10:30am. After sitting BP was 113/65.

## 2012-05-19 NOTE — Telephone Encounter (Signed)
B/P looking good. Any changes in position will produce a change in blood pressure. Make sure you change positions slowly. As long as she is not getting dizzy or lightheaded then we can leave the medication as is. Medication seems to be working well.

## 2012-05-19 NOTE — Telephone Encounter (Signed)
Pt.notified

## 2012-05-23 ENCOUNTER — Other Ambulatory Visit: Payer: Self-pay

## 2012-06-03 ENCOUNTER — Ambulatory Visit (INDEPENDENT_AMBULATORY_CARE_PROVIDER_SITE_OTHER): Payer: Medicare Other | Admitting: Internal Medicine

## 2012-06-03 ENCOUNTER — Encounter: Payer: Self-pay | Admitting: Internal Medicine

## 2012-06-03 VITALS — BP 130/70 | HR 85 | Temp 97.6°F | Ht 66.0 in | Wt 127.8 lb

## 2012-06-03 DIAGNOSIS — C439 Malignant melanoma of skin, unspecified: Secondary | ICD-10-CM

## 2012-06-03 DIAGNOSIS — R609 Edema, unspecified: Secondary | ICD-10-CM

## 2012-06-03 DIAGNOSIS — K5909 Other constipation: Secondary | ICD-10-CM

## 2012-06-03 DIAGNOSIS — K5900 Constipation, unspecified: Secondary | ICD-10-CM

## 2012-06-03 DIAGNOSIS — I10 Essential (primary) hypertension: Secondary | ICD-10-CM

## 2012-06-03 DIAGNOSIS — R600 Localized edema: Secondary | ICD-10-CM

## 2012-06-03 DIAGNOSIS — E871 Hypo-osmolality and hyponatremia: Secondary | ICD-10-CM

## 2012-06-03 MED ORDER — LACTULOSE 20 GM/30ML PO SOLN: 30.0000 mL | Freq: Every day | ORAL | Status: DC

## 2012-06-04 LAB — COMPREHENSIVE METABOLIC PANEL
ALT: 17 U/L (ref 0–35)
Albumin: 3.6 g/dL (ref 3.5–5.2)
Alkaline Phosphatase: 67 U/L (ref 39–117)
CO2: 31 mEq/L (ref 19–32)
Potassium: 3.8 mEq/L (ref 3.5–5.1)
Sodium: 129 mEq/L — ABNORMAL LOW (ref 135–145)
Total Bilirubin: 0.5 mg/dL (ref 0.3–1.2)
Total Protein: 6.8 g/dL (ref 6.0–8.3)

## 2012-06-05 ENCOUNTER — Telehealth: Payer: Self-pay | Admitting: Internal Medicine

## 2012-06-05 NOTE — Telephone Encounter (Signed)
This patient needs a follow up appt with me this week - the week of 06/07/12.  The 4:15 patient on 06/11/12 - I just saw for the problem she has listed.  If this pt does not need to come back in then can put Ms Vanover in that spot.  Thanks.  Let me know if problems.

## 2012-06-07 ENCOUNTER — Encounter: Payer: Self-pay | Admitting: Internal Medicine

## 2012-06-07 DIAGNOSIS — E871 Hypo-osmolality and hyponatremia: Secondary | ICD-10-CM | POA: Insufficient documentation

## 2012-06-07 NOTE — Assessment & Plan Note (Signed)
Has been a chronic issue for her since her melanoma treatment.  Stable.  No increased swelling currently.  Support hose.  Follow.

## 2012-06-07 NOTE — Telephone Encounter (Signed)
Appointment 06/11/12 @ 4:15 pt will arrive @ 4. Pt aware of appointment

## 2012-06-07 NOTE — Progress Notes (Signed)
Subjective:    Patient ID: Lori Henson, female    DOB: May 04, 1919, 79 y.o.   MRN: [id number]  HPI 79 year old female with past history of lichen planus, hypertension, osteoporosis, malignant melanoma (followed at the cancer center) and bradycardia (s/p pacemaker placement).  She comes in today to follow up on these issues as well as for a complete physical exam.  She states she is doing relatively well.   Appetite is good.   Bowels (lately) doing better.  Has chronic constipation.  Using lactulose regularly now and this is helping.  Eating prunes. No chest pain or tightness.  No nausea or vomiting.  Breathing stable.  Taking chlorthalidone.  Was having some issues with ankle swelling.  Saw Raquel. norvasc was stopped.  Was started on chlorthalidone.  Will need to monitor sodium closely.     Past Medical History  Diagnosis Date  . Malignant melanoma 2001    right leg, s/p radiation, recurrence 2008  . Hypertension   . 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 Lichen planus   . Angular cheilitis   . Osteoporosis     fosamax, compression fx s/p kyphoplasty    Outpatient Encounter Prescriptions as of 06/03/2012  Medication Sig Dispense Refill  . calcium carbonate (OS-CAL) 600 MG TABS Take 600 mg by mouth daily.       Jennette Banker Sodium 30-100 MG CAPS Take by mouth daily.       . chlorthalidone (HYGROTON) 25 MG tablet Please take 1/2 tablet in the morning.  30 tablet  3  . Cholecalciferol ([employer] VITAMIN D3) 1000 UNITS capsule Take 1,000 Units by mouth daily.      . clonazePAM (KLONOPIN) 0.5 MG tablet 0.5 mg. Takes 1 1/2 tablet qhs      . Lactulose 20 GM/30ML SOLN Take 30 mLs (20 g total) by mouth daily.  240 mL  5  . mirtazapine (REMERON) 15 MG tablet Take 30 mg by mouth.       . Multiple Vitamins-Minerals (ICAPS) CAPS Take by mouth 2 (two) times daily.       . Vilazodone HCl (VIIBRYD) 20 MG TABS Take by mouth daily.      . [DISCONTINUED] Lactulose 20 GM/30ML SOLN Take 30 mLs (20 g  total) by mouth daily.  240 mL  3   No facility-administered encounter medications on file as of 06/03/2012.    Review of Systems Patient denies any headache, lightheadedness or dizziness.  No significant sinus or allergy symptoms.  No chest pain, tightness or palpitations.  No increased shortness of breath, cough or congestion.  No nausea or vomiting.  No acid reflux.   No abdominal pain or cramping currently.    Has chronic constipation - better on Lactulose and prunes.  No BRBPR or melana.  No urine change.        Objective:   Physical Exam  Filed Vitals:   06/03/12 1414  BP: 130/70  Pulse: 85  Temp: 97.6 F (8.70 C)   79 year old female in no acute distress.   HEENT:  Nares- clear.  Oropharynx - without lesions. NECK:  Supple.  Nontender.  No audible bruit.  HEART:  Appears to be regular. LUNGS:  No crackles or wheezing audible.  Respirations even and unlabored.  RADIAL PULSE:  Equal bilaterally.    BREASTS:  No nipple discharge or nipple retraction present.  Could not appreciate any distinct nodules or axillary adenopathy.  ABDOMEN:  Soft, nontender.  Bowel sounds  present and normal.  No audible abdominal bruit.  GU:  Normal external genitalia.  Vaginal vault without lesions.  Cervix identified.  No pap performed.  No lesions noted.  Could not appreciate any adnexal masses or tenderness.   RECTAL:  Heme negative.   EXTREMITIES:  No increased edema present.  Stable.  DP pulses palpable and equal bilaterally.             Assessment & Plan:  MSK.  Stable.    PULMONARY.  Breathing stable.  Follow.   HEALTH MAINTENANCE.  Physical today.  Colonoscopy 02/06/04 normal.  Last mammogram 05/15/11.

## 2012-06-07 NOTE — Assessment & Plan Note (Signed)
Currently doing well.  Sees Dr Doylene Canning.  Had PET scan 12/30/11 - negative.

## 2012-06-07 NOTE — Assessment & Plan Note (Signed)
Has had issues with low sodium in the past.  Will recheck today - especially given she is on chlorthalidone.

## 2012-06-07 NOTE — Assessment & Plan Note (Signed)
Blood pressure under good control.  Same meds for now.  Check metabolic panel.  Has had some issues in the past with low sodium.  Need to recheck on the chlorthalidone.

## 2012-06-07 NOTE — Assessment & Plan Note (Signed)
See previous note.  Doing better on the Lactulose and with eating prunes.  Follow.  Colonoscopy 02/06/04 normal.

## 2012-06-11 ENCOUNTER — Ambulatory Visit: Payer: Medicare Other | Admitting: Internal Medicine

## 2012-06-14 ENCOUNTER — Ambulatory Visit (INDEPENDENT_AMBULATORY_CARE_PROVIDER_SITE_OTHER): Payer: Medicare Other | Admitting: Internal Medicine

## 2012-06-14 ENCOUNTER — Encounter: Payer: Self-pay | Admitting: Internal Medicine

## 2012-06-14 VITALS — BP 130/70 | HR 87 | Temp 98.0°F | Ht 66.0 in | Wt 128.5 lb

## 2012-06-14 DIAGNOSIS — R600 Localized edema: Secondary | ICD-10-CM

## 2012-06-14 DIAGNOSIS — R609 Edema, unspecified: Secondary | ICD-10-CM

## 2012-06-14 DIAGNOSIS — I10 Essential (primary) hypertension: Secondary | ICD-10-CM

## 2012-06-14 DIAGNOSIS — K5909 Other constipation: Secondary | ICD-10-CM

## 2012-06-14 DIAGNOSIS — E878 Other disorders of electrolyte and fluid balance, not elsewhere classified: Secondary | ICD-10-CM

## 2012-06-14 DIAGNOSIS — K5900 Constipation, unspecified: Secondary | ICD-10-CM

## 2012-06-14 DIAGNOSIS — E871 Hypo-osmolality and hyponatremia: Secondary | ICD-10-CM

## 2012-06-14 DIAGNOSIS — C439 Malignant melanoma of skin, unspecified: Secondary | ICD-10-CM

## 2012-06-14 NOTE — Assessment & Plan Note (Signed)
Followed at the [facility].  Stable.

## 2012-06-14 NOTE — Progress Notes (Signed)
Subjective:    Patient ID: Lori Henson, female    DOB: July 10, 1919, 79 y.o.   MRN: [id number]  HPI 79 year old female with past history of lichen planus, hypertension, osteoporosis, malignant melanoma (followed at the cancer center) and bradycardia (s/p pacemaker placement).  She comes in today for a scheduled follow up.  She states she is doing relatively well.   Appetite is good.   Bowels (lately) doing better.  Has chronic constipation.  Using lactulose regularly now and this is helping.  Eating prunes. Feels this is helping.   No chest pain or tightness.  No nausea or vomiting.  Breathing stable.  Sodium was low last check.  Chlorthalidone was stopped.  She has been off blood pressure medicine for the last week.  Blood pressures have mostly been averaging 120-140/70-80.  Occasionally will have a higher reading, but overall controlled.     Past Medical History  Diagnosis Date  . Malignant melanoma 2001    right leg, s/p radiation, recurrence 2008  . Hypertension   . 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 Lichen planus   . Angular cheilitis   . Osteoporosis     fosamax, compression fx s/p kyphoplasty    Outpatient Encounter Prescriptions as of 06/14/2012  Medication Sig Dispense Refill  . Casanthranol-Docusate Sodium 30-100 MG CAPS Take by mouth daily.       . Cholecalciferol ([employer]S VITAMIN D3) 1000 UNITS capsule Take 1,000 Units by mouth daily.      . clonazePAM (KLONOPIN) 0.5 MG tablet 0.5 mg. Takes 1 1/2 tablet qhs      . Lactulose 20 GM/30ML SOLN Take 30 mLs (20 g total) by mouth daily.  240 mL  5  . mirtazapine (REMERON) 15 MG tablet Take 30 mg by mouth.       . Multiple Vitamins-Minerals (ICAPS) CAPS Take by mouth 2 (two) times daily.       . Vilazodone HCl (VIIBRYD) 20 MG TABS Take by mouth daily.      . calcium carbonate (OS-CAL) 600 MG TABS Take 600 mg by mouth daily.       . [DISCONTINUED] chlorthalidone (HYGROTON) 25 MG tablet Please take 1/2 tablet in the morning.  30 tablet  3    No facility-administered encounter medications on file as of 06/14/2012.    Review of Systems Patient denies any headache, lightheadedness or dizziness.  No significant sinus or allergy symptoms.  No chest pain, tightness or palpitations.  No increased shortness of breath, cough or congestion.  No nausea or vomiting.  No acid reflux.   No abdominal pain or cramping currently.    Has chronic constipation - better on Lactulose and prunes.  No BRBPR or melana.  No urine change.        Objective:   Physical Exam  Filed Vitals:   06/14/12 1358  BP: 130/70  Pulse: 87  Temp: 98 F (49.14 C)   79 year old female in no acute distress.   HEENT:  Nares- clear.  Oropharynx - without lesions. NECK:  Supple.  Nontender.  No audible bruit.  HEART:  Appears to be regular. LUNGS:  No crackles or wheezing audible.  Respirations even and unlabored.  RADIAL PULSE:  Equal bilaterally.  ABDOMEN:  Soft, nontender.  Bowel sounds present and normal.  No audible abdominal bruit.    EXTREMITIES:  No increased edema present.  Stable ankle edema.  Assessment & Plan:  MSK.  Stable.    PULMONARY.  Breathing stable.  Follow.   HEALTH MAINTENANCE.  Physical last visit.  Colonoscopy 02/06/04 normal.  Last mammogram 05/15/11.  Scheduled for a follow up mammogram.

## 2012-06-14 NOTE — Assessment & Plan Note (Signed)
Sodium low on last check.  Chlorthalidone stopped.   Will recheck today - now that she is off chlorthalidone.

## 2012-06-14 NOTE — Assessment & Plan Note (Signed)
Stable with lactulose.  Now eating prunes.  Helping.  Follow.

## 2012-06-14 NOTE — Assessment & Plan Note (Signed)
Has been a chronic issue for her since her melanoma treatment.  Stable.  No increased swelling currently.  Support hose.  Follow.      

## 2012-06-14 NOTE — Assessment & Plan Note (Signed)
Blood pressure appears to be ok on no medication.  Will remain off for now.  Have her spot check her pressure.  Check metabolic panel.  Confirm sodium improving.

## 2012-06-15 LAB — BASIC METABOLIC PANEL
BUN: 10 mg/dL (ref 6–23)
CO2: 28 mEq/L (ref 19–32)
Chloride: 96 mEq/L (ref 96–112)
Glucose, Bld: 82 mg/dL (ref 70–99)
Potassium: 4.7 mEq/L (ref 3.5–5.1)
Sodium: 132 mEq/L — ABNORMAL LOW (ref 135–145)

## 2012-06-16 ENCOUNTER — Telehealth: Payer: Self-pay | Admitting: *Deleted

## 2012-06-16 NOTE — Telephone Encounter (Signed)
Called patient to let her know that she is to stay off amlodipine for now.

## 2012-06-17 ENCOUNTER — Telehealth: Payer: Self-pay | Admitting: Internal Medicine

## 2012-06-17 DIAGNOSIS — E871 Hypo-osmolality and hyponatremia: Secondary | ICD-10-CM

## 2012-06-17 NOTE — Telephone Encounter (Signed)
Pt notified of labs via my chart.  She needs a follow up lab appt in 2-3 weeks.  Please schedule her for a non fasting lab appointment and call her with the time.  Thanks.

## 2012-06-17 NOTE — Telephone Encounter (Signed)
Appointment 3/27 pt aware

## 2012-06-21 ENCOUNTER — Ambulatory Visit: Payer: Medicare Other | Admitting: Internal Medicine

## 2012-06-23 ENCOUNTER — Ambulatory Visit: Payer: Medicare Other | Admitting: Internal Medicine

## 2012-06-24 ENCOUNTER — Telehealth: Payer: Self-pay | Admitting: Internal Medicine

## 2012-06-24 NOTE — Telephone Encounter (Signed)
Patient is having blood work done at Dr. Alcide Clever office on 3.27.14. She is wanting to know if she can have her Sodium blood work drawn at that time for Dr. Lorin Picket and have them to fax the results back to this office.

## 2012-06-24 NOTE — Telephone Encounter (Signed)
Noted.  Tell her to have them send me results.

## 2012-06-24 NOTE — Telephone Encounter (Signed)
The patient called back to inform the physician that Dr. Alcide Clever office will draw her lab to check her sodium.

## 2012-06-30 ENCOUNTER — Ambulatory Visit: Payer: Self-pay | Admitting: Oncology

## 2012-07-01 ENCOUNTER — Other Ambulatory Visit: Payer: Medicare Other

## 2012-07-01 LAB — COMPREHENSIVE METABOLIC PANEL
Albumin: 3.3 g/dL — ABNORMAL LOW (ref 3.4–5.0)
Anion Gap: 6 — ABNORMAL LOW (ref 7–16)
BUN: 13 mg/dL (ref 7–18)
Chloride: 99 mmol/L (ref 98–107)
Co2: 30 mmol/L (ref 21–32)
Creatinine: 0.9 mg/dL (ref 0.60–1.30)
EGFR (African American): >60
EGFR (Non-African Amer.): 56 — ABNORMAL LOW
Glucose: 98 mg/dL (ref 65–99)
Osmolality: 270 (ref 275–301)
Potassium: 4.8 mmol/L (ref 3.5–5.1)
SGOT(AST): 21 U/L (ref 15–37)
SGPT (ALT): 23 U/L (ref 12–78)
Sodium: 135 mmol/L — ABNORMAL LOW (ref 136–145)
Total Protein: 7.1 g/dL (ref 6.4–8.2)

## 2012-07-01 LAB — CBC CANCER CENTER
Eosinophil %: 2.1 %
HCT: 38.3 % (ref 35.0–47.0)
HGB: 12.9 g/dL (ref 12.0–16.0)
Lymphocyte #: 1.5 x10 3/mm (ref 1.0–3.6)
Lymphocyte %: 22.7 %
MCH: 30.7 pg (ref 26.0–34.0)
Monocyte %: 8.9 %
Neutrophil #: 4.2 x10 3/mm (ref 1.4–6.5)
Neutrophil %: 66 %
RBC: 4.22 10*6/uL (ref 3.80–5.20)
WBC: 6.4 x10 3/mm (ref 3.6–11.0)

## 2012-07-06 ENCOUNTER — Ambulatory Visit: Payer: Self-pay | Admitting: Oncology

## 2012-07-20 ENCOUNTER — Ambulatory Visit (INDEPENDENT_AMBULATORY_CARE_PROVIDER_SITE_OTHER): Payer: Medicare Other | Admitting: Internal Medicine

## 2012-07-20 ENCOUNTER — Encounter: Payer: Self-pay | Admitting: Internal Medicine

## 2012-07-20 VITALS — BP 110/70 | HR 90 | Temp 98.0°F | Ht 66.0 in | Wt 128.2 lb

## 2012-07-20 DIAGNOSIS — R600 Localized edema: Secondary | ICD-10-CM

## 2012-07-20 DIAGNOSIS — K5909 Other constipation: Secondary | ICD-10-CM

## 2012-07-20 DIAGNOSIS — I10 Essential (primary) hypertension: Secondary | ICD-10-CM

## 2012-07-20 DIAGNOSIS — C439 Malignant melanoma of skin, unspecified: Secondary | ICD-10-CM

## 2012-07-20 DIAGNOSIS — R609 Edema, unspecified: Secondary | ICD-10-CM

## 2012-07-20 DIAGNOSIS — E871 Hypo-osmolality and hyponatremia: Secondary | ICD-10-CM

## 2012-07-20 DIAGNOSIS — K5900 Constipation, unspecified: Secondary | ICD-10-CM

## 2012-07-20 DIAGNOSIS — Z1239 Encounter for other screening for malignant neoplasm of breast: Secondary | ICD-10-CM

## 2012-07-25 ENCOUNTER — Encounter: Payer: Self-pay | Admitting: Internal Medicine

## 2012-07-25 NOTE — Assessment & Plan Note (Signed)
Sodium low on last check.  Chlorthalidone stopped.   Will recheck today - now that she has been off chlorthalidone.

## 2012-07-25 NOTE — Assessment & Plan Note (Signed)
Stable with lactulose.  Now eating prunes.  Helping.  Follow.

## 2012-07-25 NOTE — Assessment & Plan Note (Signed)
Has been a chronic issue for her since her melanoma treatment.  Stable.  No increased swelling currently.  Support hose.  Follow.

## 2012-07-25 NOTE — Assessment & Plan Note (Signed)
Blood pressure appears to be ok on no medication.  Will remain off for now.  Have her spot check her pressure.  Check metabolic panel.  Confirm sodium improving.

## 2012-07-25 NOTE — Progress Notes (Signed)
Subjective:    Patient ID: Lori Henson, female    DOB: July 10, 1919, 79 y.o.   MRN: [id number]  HPI 79 year old female with past history of lichen planus, hypertension, osteoporosis, malignant melanoma (followed at the cancer center) and bradycardia (s/p pacemaker placement).  She comes in today for a scheduled follow up.  She states she is doing relatively well.   Appetite is good.   Bowels (lately) doing better.  Has chronic constipation.  Using lactulose regularly now and this is helping.   Still good and bad days.  Eating prunes. Feels this is helping.   No chest pain or tightness.  No nausea or vomiting.  Breathing stable.  Sodium was low last check.  Chlorthalidone was stopped.  She has been off blood pressure medicine since before her last visit.  Blood pressures have mostly been averaging 120-140s/70-80.  Occasionally will have a higher reading, but overall controlled.     Past Medical History  Diagnosis Date  . Malignant melanoma 2001    right leg, s/p radiation, recurrence 2008  . Hypertension   . 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 Lichen planus   . Angular cheilitis   . Osteoporosis     fosamax, compression fx s/p kyphoplasty    Outpatient Encounter Prescriptions as of 07/20/2012  Medication Sig Dispense Refill  . Casanthranol-Docusate Sodium 30-100 MG CAPS Take by mouth daily.       . Cholecalciferol ([employer]S VITAMIN D3) 1000 UNITS capsule Take 1,000 Units by mouth daily.      . clonazePAM (KLONOPIN) 0.5 MG tablet 0.5 mg. Takes 1 1/2 tablet qhs      . Lactulose 20 GM/30ML SOLN Take 30 mLs (20 g total) by mouth daily.  240 mL  5  . mirtazapine (REMERON) 15 MG tablet Take 30 mg by mouth.       . Multiple Vitamins-Minerals (ICAPS) CAPS Take by mouth 2 (two) times daily.       . [DISCONTINUED] Vilazodone HCl (VIIBRYD) 20 MG TABS Take by mouth daily.      . calcium carbonate (OS-CAL) 600 MG TABS Take 600 mg by mouth daily.        No facility-administered encounter medications on file  as of 07/20/2012.    Review of Systems Patient denies any headache, lightheadedness or dizziness.  No significant sinus or allergy symptoms.  No chest pain, tightness or palpitations.  No increased shortness of breath, cough or congestion.  No nausea or vomiting.  No acid reflux.   No abdominal pain or cramping currently.    Has chronic constipation - better on Lactulose and prunes.  No BRBPR or melana.  No urine change.  Chronic lower extremity/pedal swelling.  Support hose help.        Objective:   Physical Exam  Filed Vitals:   07/20/12 1500  BP: 110/70  Pulse: 90  Temp: 98 F (51.73 C)   79 year old female in no acute distress.   HEENT:  Nares- clear.  Oropharynx - without lesions. NECK:  Supple.  Nontender.  No audible bruit.  HEART:  Appears to be regular. LUNGS:  No crackles or wheezing audible.  Respirations even and unlabored.  RADIAL PULSE:  Equal bilaterally.  ABDOMEN:  Soft, nontender.  Bowel sounds present and normal.  No audible abdominal bruit.    EXTREMITIES:  No increased edema present.  Stable ankle edema.               Assessment &  Plan:  MSK.  Stable.    PULMONARY.  Breathing stable.  Follow.   HEALTH MAINTENANCE.  Physical 06/03/12.  Colonoscopy 02/06/04 normal.  Last mammogram 05/15/11.  Schedule a follow up mammogram.

## 2012-07-25 NOTE — Assessment & Plan Note (Signed)
Currently doing well.  Sees Dr Doylene Canning.  Had PET scan 12/30/11 - negative.

## 2012-08-17 ENCOUNTER — Encounter: Payer: Self-pay | Admitting: Internal Medicine

## 2012-08-23 ENCOUNTER — Ambulatory Visit: Payer: Self-pay | Admitting: Internal Medicine

## 2012-08-28 ENCOUNTER — Encounter: Payer: Self-pay | Admitting: Internal Medicine

## 2012-09-05 ENCOUNTER — Other Ambulatory Visit: Payer: Self-pay | Admitting: Internal Medicine

## 2012-09-05 MED ORDER — LACTULOSE 10 GM/15ML PO SOLN: ORAL | Status: DC

## 2012-09-05 NOTE — Progress Notes (Signed)
Refilled lactulose

## 2012-09-23 ENCOUNTER — Ambulatory Visit: Payer: Medicare Other | Admitting: Internal Medicine

## 2012-10-20 ENCOUNTER — Encounter: Payer: Self-pay | Admitting: Internal Medicine

## 2012-10-25 ENCOUNTER — Ambulatory Visit: Payer: Medicare Other | Admitting: Internal Medicine

## 2012-11-01 ENCOUNTER — Ambulatory Visit (INDEPENDENT_AMBULATORY_CARE_PROVIDER_SITE_OTHER): Payer: Medicare Other | Admitting: Internal Medicine

## 2012-11-01 ENCOUNTER — Encounter: Payer: Self-pay | Admitting: Internal Medicine

## 2012-11-01 VITALS — BP 130/80 | HR 84 | Temp 98.0°F | Ht 66.0 in | Wt 121.5 lb

## 2012-11-01 DIAGNOSIS — R600 Localized edema: Secondary | ICD-10-CM

## 2012-11-01 DIAGNOSIS — R609 Edema, unspecified: Secondary | ICD-10-CM

## 2012-11-01 DIAGNOSIS — K5909 Other constipation: Secondary | ICD-10-CM

## 2012-11-01 DIAGNOSIS — C439 Malignant melanoma of skin, unspecified: Secondary | ICD-10-CM

## 2012-11-01 DIAGNOSIS — I10 Essential (primary) hypertension: Secondary | ICD-10-CM

## 2012-11-01 DIAGNOSIS — E871 Hypo-osmolality and hyponatremia: Secondary | ICD-10-CM

## 2012-11-01 DIAGNOSIS — K5900 Constipation, unspecified: Secondary | ICD-10-CM

## 2012-11-01 NOTE — Assessment & Plan Note (Addendum)
Takes lactulose.  Now eating prunes.  Good and bad days.  Had a "bad" episode this weekend.  Had a good bowel movement yesterday.  Discussed using and enema if she goes a few days without a bowel movement.  disucssed referral back to GI for further treatment recommendations.  She declines.  Will follow.

## 2012-11-01 NOTE — Progress Notes (Signed)
Subjective:    Patient ID: Lori Henson, female    DOB: 09-15-1919, 79 y.o.   MRN: [id number]  HPI 79 year old female with past history of lichen planus, hypertension, osteoporosis, malignant melanoma (followed at the cancer center) and bradycardia (s/p pacemaker placement).  She comes in today for a scheduled follow up.  She states she is doing relatively well.   Appetite decreased recently secondary to some increased constipation.   Has chronic constipation.  Using lactulose regularly and is eating prunes.  Still good and bad days.  Had a bad episode this weekend with increased abdominal discomfort.  Had a good bowel movement yesterday.   No chest pain or tightness.  No nausea or vomiting.  Breathing stable.  Sodium had been low.   Chlorthalidone was stopped.  She has been off blood pressure medicine now for a while.   Blood pressures have mostly been averaging 120-140s/70-80s.  Occasionally will have a higher reading, but overall controlled.     Past Medical History  Diagnosis Date  . Malignant melanoma 2001    right leg, s/p radiation, recurrence 2008  . Hypertension   . 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 Lichen planus   . Angular cheilitis   . Osteoporosis     fosamax, compression fx s/p kyphoplasty    Outpatient Encounter Prescriptions as of 11/01/2012  Medication Sig Dispense Refill  . calcium carbonate (TUMS EX) 750 MG chewable tablet Chew 1 tablet by mouth 2 (two) times daily.      Jennette Banker Sodium 30-100 MG CAPS Take by mouth daily.       . Cholecalciferol (CVS VITAMIN D3) 1000 UNITS capsule Take 1,000 Units by mouth daily.      . clonazePAM (KLONOPIN) 0.5 MG tablet 0.5 mg. Takes 1 1/2 tablet qhs      . lactulose (CHRONULAC) 10 GM/15ML solution Take 30 milliliters by mouth once a day prn  500 mL  5  . mirtazapine (REMERON) 15 MG tablet Take 30 mg by mouth.       . Multiple Vitamins-Minerals (ICAPS) CAPS Take by mouth 2 (two) times daily.       . [DISCONTINUED]  calcium carbonate (OS-CAL) 600 MG TABS Take 600 mg by mouth daily.        No facility-administered encounter medications on file as of 11/01/2012.    Review of Systems Patient denies any headache, lightheadedness or dizziness.  No significant sinus or allergy symptoms.  No chest pain, tightness or palpitations.  No increased shortness of breath, cough or congestion.  No nausea or vomiting.  No acid reflux.  Had chronic constipation - better on Lactulose and prunes.  Good and bad days.  No BRBPR or melana.  No urine change.  Chronic lower extremity/pedal swelling.  Support hose help.        Objective:   Physical Exam  Filed Vitals:   11/01/12 1527  BP: 130/80  Pulse: 84  Temp: 98 F (23.66 C)   79 year old female in no acute distress.   HEENT:  Nares- clear.  Oropharynx - without lesions. NECK:  Supple.  Nontender.  No audible bruit.  HEART:  Appears to be regular. LUNGS:  No crackles or wheezing audible.  Respirations even and unlabored.  RADIAL PULSE:  Equal bilaterally.  ABDOMEN:  Soft, nontender.  Bowel sounds present and normal.  No audible abdominal bruit.    EXTREMITIES:  No increased edema.  Stable ankle edema.  No increased erythema.  Assessment & Plan:  MSK.  Stable.    PULMONARY.  Breathing stable.  Follow.   HEALTH MAINTENANCE.  Physical 06/03/12.  Colonoscopy 02/06/04 normal.  Mammogram 08/23/12 - Birads II.

## 2012-11-01 NOTE — Assessment & Plan Note (Addendum)
Off chlorthalidone.  Will have labs drawn at Dr Aleda Grana office.

## 2012-11-01 NOTE — Assessment & Plan Note (Addendum)
Blood pressure appears to be ok on no medication.  Will remain off for now.  Have her continue to spot check her pressure.  Follow metabolic panel.  Confirm sodium improving.  Due to get labs through Dr Aleda Grana office.

## 2012-11-02 ENCOUNTER — Encounter: Payer: Self-pay | Admitting: Internal Medicine

## 2012-11-02 NOTE — Assessment & Plan Note (Signed)
Support hose.  Stable.     

## 2012-11-02 NOTE — Assessment & Plan Note (Signed)
Currently doing well.  Sees Dr Choksi.  Had PET scan 12/30/11 - negative.     

## 2012-11-05 ENCOUNTER — Telehealth: Payer: Self-pay | Admitting: *Deleted

## 2012-11-05 ENCOUNTER — Emergency Department: Payer: Self-pay | Admitting: Emergency Medicine

## 2012-11-05 LAB — URINALYSIS, COMPLETE
Bacteria: NONE SEEN
Bilirubin,UR: NEGATIVE
Nitrite: NEGATIVE
Ph: 7 (ref 4.5–8.0)
RBC,UR: 16 /HPF (ref 0–5)
Specific Gravity: 1.005 (ref 1.003–1.030)
WBC UR: 21 /HPF (ref 0–5)

## 2012-11-05 LAB — COMPREHENSIVE METABOLIC PANEL
Alkaline Phosphatase: 96 U/L (ref 50–136)
Anion Gap: 7 (ref 7–16)
BUN: 8 mg/dL (ref 7–18)
Chloride: 96 mmol/L — ABNORMAL LOW (ref 98–107)
Co2: 28 mmol/L (ref 21–32)
EGFR (Non-African Amer.): >60
Glucose: 85 mg/dL (ref 65–99)
Osmolality: 260 (ref 275–301)
Potassium: 3.7 mmol/L (ref 3.5–5.1)
SGOT(AST): 25 U/L (ref 15–37)
SGPT (ALT): 17 U/L (ref 12–78)
Sodium: 131 mmol/L — ABNORMAL LOW (ref 136–145)
Total Protein: 7.1 g/dL (ref 6.4–8.2)

## 2012-11-05 LAB — CBC
HGB: 13.6 g/dL (ref 12.0–16.0)
MCV: 88 fL (ref 80–100)
Platelet: 190 10*3/uL (ref 150–440)
RDW: 13.7 % (ref 11.5–14.5)

## 2012-11-05 NOTE — Telephone Encounter (Signed)
Yes Ok

## 2012-11-05 NOTE — Telephone Encounter (Signed)
Pt was seen in ER today by Dr. Wynell Balloon for a ankle sprain. They needed a verbal okay to has pt setup with PT & Aide. I informed the nurse that you were out of the office today, but you would have no problem giving the okay to proceed with Pt & aide.

## 2012-11-07 ENCOUNTER — Other Ambulatory Visit: Payer: Self-pay | Admitting: Internal Medicine

## 2012-11-09 NOTE — Progress Notes (Signed)
Opened in error

## 2012-11-10 ENCOUNTER — Other Ambulatory Visit: Payer: Self-pay

## 2012-11-12 DIAGNOSIS — F411 Generalized anxiety disorder: Secondary | ICD-10-CM

## 2012-11-12 DIAGNOSIS — E871 Hypo-osmolality and hyponatremia: Secondary | ICD-10-CM

## 2012-11-12 DIAGNOSIS — S93409A Sprain of unspecified ligament of unspecified ankle, initial encounter: Secondary | ICD-10-CM

## 2012-11-12 DIAGNOSIS — M159 Polyosteoarthritis, unspecified: Secondary | ICD-10-CM

## 2012-11-15 ENCOUNTER — Other Ambulatory Visit: Payer: Self-pay | Admitting: Internal Medicine

## 2012-11-15 DIAGNOSIS — E871 Hypo-osmolality and hyponatremia: Secondary | ICD-10-CM

## 2012-11-15 NOTE — Progress Notes (Signed)
Order placed for follow up sodium.

## 2012-11-18 ENCOUNTER — Encounter: Payer: Self-pay | Admitting: *Deleted

## 2012-11-19 ENCOUNTER — Telehealth: Payer: Self-pay | Admitting: Internal Medicine

## 2012-11-19 ENCOUNTER — Encounter: Payer: Self-pay | Admitting: Psychiatry

## 2012-11-19 NOTE — Telephone Encounter (Signed)
Spoke with nurse & gave a verbal order to have Sodium rechecked on Monday or Tuesday.

## 2012-11-19 NOTE — Telephone Encounter (Signed)
Needs order for pt to have repeat potassium on Monday.

## 2012-11-19 NOTE — Telephone Encounter (Signed)
Order written and in your basket.

## 2012-11-22 ENCOUNTER — Telehealth: Payer: Self-pay | Admitting: Internal Medicine

## 2012-11-22 NOTE — Telephone Encounter (Signed)
Patient called stating the ER f/u for 8/26 is too early for her to make with her sleeping issues. She states she is too weak to try to get here that early. She is wondering if you can offer her any afternoon apts. There are none available for a 30 minute slot. Please advise.

## 2012-11-23 NOTE — Telephone Encounter (Signed)
Please see if the 1:30 on 11/30/12 could change appts with her.  Have the 1:30 come at 10:30 and Ms Fregeau come at 1:30.  Thanks.

## 2012-11-23 NOTE — Telephone Encounter (Signed)
Appointment switched both pt aware of appointment time changes

## 2012-11-30 ENCOUNTER — Encounter: Payer: Self-pay | Admitting: Internal Medicine

## 2012-11-30 ENCOUNTER — Ambulatory Visit (INDEPENDENT_AMBULATORY_CARE_PROVIDER_SITE_OTHER): Payer: Medicare Other | Admitting: Internal Medicine

## 2012-11-30 VITALS — BP 130/80 | HR 75 | Temp 97.8°F | Ht 66.0 in | Wt 127.5 lb

## 2012-11-30 DIAGNOSIS — E871 Hypo-osmolality and hyponatremia: Secondary | ICD-10-CM

## 2012-11-30 DIAGNOSIS — C439 Malignant melanoma of skin, unspecified: Secondary | ICD-10-CM

## 2012-11-30 DIAGNOSIS — F3289 Other specified depressive episodes: Secondary | ICD-10-CM

## 2012-11-30 DIAGNOSIS — R600 Localized edema: Secondary | ICD-10-CM

## 2012-11-30 DIAGNOSIS — F329 Major depressive disorder, single episode, unspecified: Secondary | ICD-10-CM

## 2012-11-30 DIAGNOSIS — I10 Essential (primary) hypertension: Secondary | ICD-10-CM

## 2012-11-30 DIAGNOSIS — K5900 Constipation, unspecified: Secondary | ICD-10-CM

## 2012-11-30 DIAGNOSIS — K5909 Other constipation: Secondary | ICD-10-CM

## 2012-11-30 DIAGNOSIS — R609 Edema, unspecified: Secondary | ICD-10-CM

## 2012-11-30 MED ORDER — LACTULOSE 10 GM/15ML PO SOLN: ORAL | Status: DC

## 2012-12-02 ENCOUNTER — Telehealth: Payer: Self-pay | Admitting: Internal Medicine

## 2012-12-02 NOTE — Telephone Encounter (Signed)
Lori Henson called to report that during her visit with [name], she c/o weakness & her labia is burning. Lori Henson reports that her temp was normal, she denies urinary frequency & urgency. She also states that her most recent lab results showed that her WBC was decreased and wanted to know if she should have it rechecked.

## 2012-12-02 NOTE — Telephone Encounter (Signed)
If there is concern regarding vaginal burning and yeast infection- she can try monistat -3 one applicator q hs for three nights.  Regarding the white blood cell count - if they can send me a copy of the labs and I will review and see if something more needs to be done.

## 2012-12-02 NOTE — Telephone Encounter (Signed)
The patient is wanting to know if you have received the blood results from Dr. Claudie Fisherman.

## 2012-12-03 NOTE — Telephone Encounter (Signed)
Pt notified to try the Monistat & lab results ordered by Dr. Imogene Burn were requested & received (In your folder)

## 2012-12-03 NOTE — Telephone Encounter (Signed)
Pt.notified

## 2012-12-03 NOTE — Telephone Encounter (Signed)
I reviewed her labs from Dr Imogene Burn.  Her white blood cell count is normal.  Her sodium was low, but we have rechecked that since and better.   She was treated for her urinary tract infection.

## 2012-12-05 ENCOUNTER — Encounter: Payer: Self-pay | Admitting: Internal Medicine

## 2012-12-05 DIAGNOSIS — F329 Major depressive disorder, single episode, unspecified: Secondary | ICD-10-CM | POA: Insufficient documentation

## 2012-12-05 NOTE — Assessment & Plan Note (Signed)
Off chlorthalidone.  Recent sodium slightly decreased.  Recheck improved.  Follow. Home health nurse to draw.

## 2012-12-05 NOTE — Assessment & Plan Note (Signed)
Seeing Dr Imogene Burn.  He is adjusting her medications.  Continue to follow up with Dr Imogene Burn.

## 2012-12-05 NOTE — Assessment & Plan Note (Signed)
Support hose.  Stable.

## 2012-12-05 NOTE — Assessment & Plan Note (Signed)
Currently doing well.  Sees Dr Doylene Canning.  Had PET scan 12/30/11 - negative.

## 2012-12-05 NOTE — Assessment & Plan Note (Addendum)
Takes lactulose.  Now eating prunes.  Good and bad days.  Discussed using and enema if she goes a few days without a bowel movement.  Have disucssed referral back to GI for further treatment recommendations.  She declines.  Will follow.

## 2012-12-05 NOTE — Assessment & Plan Note (Signed)
Blood pressure appears to be ok on no medication.  Will remain off for now.  Have her continue to spot check her pressure.  Follow metabolic panel.  Follow pressures.

## 2012-12-05 NOTE — Progress Notes (Addendum)
Subjective:    Patient ID: Lori Henson, female    DOB: [birth year], 79 y.o.   MRN: [id number]  HPI 79 year old female with past history of lichen planus, hypertension, osteoporosis, malignant melanoma (followed at the cancer center) and bradycardia (s/p pacemaker placement).  She comes in today for a scheduled follow up.  On 11/05/12, she walked to her bathroom and fell.  Sprained her left ankle.  She did hit her head.  Went to ER.  xrays and scans - ok.  No fracture or acute abnormality.  Home health arranged.  Currently receiving in home physical therapy.  Pain has improved. Foot better. She is seeing Dr Lori Henson. He is changing some of her medications.  She is taking trazodone to help her sleep.  Last evaluated 11/23/12.  Due to follow up with Dr Lori Henson 12/28/12 and Dr Lori Henson 01/03/13.  Still limited mobility around the house.      Past Medical History  Diagnosis Date  . Malignant melanoma 2001    right leg, s/p radiation, recurrence 2008  . Hypertension   . 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 Lichen planus   . Angular cheilitis   . Osteoporosis     fosamax, compression fx s/p kyphoplasty    Outpatient Encounter Prescriptions as of 11/30/2012  Medication Sig Dispense Refill  . calcium carbonate (TUMS EX) 750 MG chewable tablet Chew 1 tablet by mouth 2 (two) times daily.      Lori Henson 30-100 MG CAPS Take by mouth daily.       . Cholecalciferol (CVS VITAMIN D3) 1000 UNITS capsule Take 1,000 Units by mouth daily.      . clonazePAM (KLONOPIN) 0.5 MG tablet 0.5 mg. Takes 1 1/2 tablet qhs      . lactulose (CHRONULAC) 10 GM/15ML solution Take 30 milliliters by mouth once a day prn  500 mL  5  . Multiple Vitamins-Minerals (ICAPS) CAPS Take by mouth 2 (two) times daily.       . [DISCONTINUED] lactulose (CHRONULAC) 10 GM/15ML solution Take 30 milliliters by mouth once a day prn  500 mL  5  . mirtazapine (REMERON) 15 MG tablet Take 30 mg by mouth.        No  facility-administered encounter medications on file as of 11/30/2012.    Review of Systems Patient denies any headache, lightheadedness or dizziness.  No significant sinus or allergy symptoms.  No chest pain, tightness or palpitations.  No increased shortness of breath, cough or congestion.  No nausea or vomiting.  No acid reflux.  Has chronic constipation - better on Lactulose and prunes.  Good and bad days.  No BRBPR or melana.  No urine change.  Still with increased ankle pain.  Has improved some.  Still with limited mobility.  Therapy helping.  Unsteady.        Objective:   Physical Exam  Filed Vitals:   11/30/12 1334  BP: 130/80  Pulse: 75  Temp: 97.8 F (31.17 C)   79 year old female in no acute distress.   HEENT:  Nares- clear.  Oropharynx - without lesions. NECK:  Supple.  Nontender.  No audible bruit.  HEART:  Appears to be regular. LUNGS:  No crackles or wheezing audible.  Respirations even and unlabored.  RADIAL PULSE:  Equal bilaterally.  ABDOMEN:  Soft, nontender.  Bowel sounds present and normal.  No audible abdominal bruit.    EXTREMITIES:  No increased edema.  Stable ankle edema.  No  increased erythema.               Assessment & Plan:  MSK.  Left ankle sprain as outlined.  Continue home physical therapy.  Limited mobility.  Needs help with ADLs.    PULMONARY.  Breathing stable.  Follow.   HEALTH MAINTENANCE.  Physical 06/03/12.  Colonoscopy 02/06/04 normal.  Mammogram 08/23/12 - Birads II.    Addendum:  I received and reviewed Ms Lori Henson's My Chart message regarding her follow up visit questionnaire (12/08/12).  Also, received message 12/08/12 - Pt was going to ER for further evaluation of the increased pain.   Dr Lorin Picket

## 2012-12-07 ENCOUNTER — Encounter: Payer: Self-pay | Admitting: Internal Medicine

## 2012-12-08 ENCOUNTER — Telehealth: Payer: Self-pay | Admitting: Internal Medicine

## 2012-12-08 ENCOUNTER — Emergency Department: Payer: Self-pay | Admitting: Emergency Medicine

## 2012-12-08 NOTE — Telephone Encounter (Signed)
Just an FYI, I called & spoke with her husband. They were call EMS & [facility].

## 2012-12-08 NOTE — Telephone Encounter (Signed)
Patient Information:  Caller Name: Renae Fickle  Phone: [phone]  Patient: Lori Henson, Lori Henson  Gender: Female  DOB: 1920-03-12  Age: 79 Years  PCP: [facility]  Office Follow Up:  Does the office need to follow up with this patient?: No  Instructions For The Office: N/A  RN Note:  No thermometer; suspects fever per tactile. Larey Seat in July 2014; sprained ankle. Last BM 12/07/12. Subumbilical abdominal pain present; rated 3/10.  Plans to call EMS because he is unable to get her up to car due to severe pain.    Symptoms  Reason For Call & Symptoms: Severe lower back pain.  Pain rated 10/10.  Can get out of bed with a walker to use bedside commode or to bathroom but pain is excruciating even when lying still.  Reviewed Health History In EMR: Yes  Reviewed Medications In EMR: Yes  Reviewed Allergies In EMR: Yes  Reviewed Surgeries / Procedures: Yes  Date of Onset of Symptoms: 12/07/2012  Any Fever: Yes  Fever Taken: Tactile  Fever Time Of Reading: 12:30:00  Fever Last Reading: N/A  Guideline(s) Used:  Back Pain  Disposition Per Guideline:   Go to ED Now  Reason For Disposition Reached:   Abdominal pain and age > 49  Advice Given:  N/A  Patient Will Follow Care Advice:  YES

## 2012-12-08 NOTE — Telephone Encounter (Signed)
Noted  

## 2012-12-10 ENCOUNTER — Ambulatory Visit: Payer: Medicare Other | Admitting: Internal Medicine

## 2012-12-11 ENCOUNTER — Emergency Department: Payer: Self-pay | Admitting: Emergency Medicine

## 2012-12-11 LAB — URINALYSIS, COMPLETE
Bilirubin,UR: NEGATIVE
Blood: NEGATIVE
Glucose,UR: NEGATIVE mg/dL (ref 0–75)
Ketone: NEGATIVE
Ph: 7 (ref 4.5–8.0)
RBC,UR: NONE SEEN /HPF (ref 0–5)
Specific Gravity: 1.005 (ref 1.003–1.030)
Squamous Epithelial: <1
WBC UR: 2 /HPF (ref 0–5)

## 2012-12-11 LAB — BASIC METABOLIC PANEL
Chloride: 96 mmol/L — ABNORMAL LOW (ref 98–107)
Creatinine: 0.74 mg/dL (ref 0.60–1.30)
Glucose: 101 mg/dL — ABNORMAL HIGH (ref 65–99)
Osmolality: 264 (ref 275–301)

## 2012-12-11 LAB — CBC
HCT: 37.8 % (ref 35.0–47.0)
HGB: 12.8 g/dL (ref 12.0–16.0)
MCH: 30.9 pg (ref 26.0–34.0)
MCV: 91 fL (ref 80–100)
Platelet: 206 10*3/uL (ref 150–440)
RBC: 4.15 10*6/uL (ref 3.80–5.20)
RDW: 14.1 % (ref 11.5–14.5)
WBC: 6.4 10*3/uL (ref 3.6–11.0)

## 2012-12-13 ENCOUNTER — Telehealth: Payer: Self-pay | Admitting: Internal Medicine

## 2012-12-13 NOTE — Telephone Encounter (Signed)
The patients husband is calling in stating they were in the ER with his wife, Lori Henson. She is having issues with severe lower back pain. The ER stated for them to f/u with [place] for possible PT referral. Could we make this referral w/o them coming in or should they make an apt.

## 2012-12-13 NOTE — Telephone Encounter (Signed)
Do you have the fax we sent for her home health to know what agency was involved with her.  Pt could not remember.

## 2012-12-13 NOTE — Telephone Encounter (Signed)
I am fine to make referral without her having to come in - can we do this.  She was recently seen.  Let me know and I will do whatever I need to do.

## 2012-12-13 NOTE — Telephone Encounter (Signed)
Lori Henson, Patient now wanting to stay at home with home health. Do you have any information on who the hospital set home health with?

## 2012-12-13 NOTE — Telephone Encounter (Signed)
[facility] script placed in your folder for a signature. Patient is aware.

## 2012-12-13 NOTE — Telephone Encounter (Signed)
Records requested

## 2012-12-14 NOTE — Telephone Encounter (Signed)
Script for [facility] for PT has been faxed to [phone]

## 2012-12-14 NOTE — Telephone Encounter (Signed)
[facility] (office# [phone], fax# [phone])

## 2012-12-14 NOTE — Telephone Encounter (Signed)
fyi

## 2012-12-15 ENCOUNTER — Telehealth: Payer: Self-pay | Admitting: Internal Medicine

## 2012-12-15 NOTE — Telephone Encounter (Signed)
Pt's husband notified of appointment with Dr. Yves Dill tomorrow, verbalized understanding.

## 2012-12-15 NOTE — Telephone Encounter (Signed)
Spoke to Ms [place].  Needs pain clinic referral.  Called Dr Yves Dill office.  They are going to work her in tomorrow at 3:00 (pt needs to be there at 2:45).  Please call pt or her husband and notify them of appt date and time.  Please forward Quenten Raven message and have her send my last note and ER records (before visit tomorrow).    Thanks.

## 2012-12-15 NOTE — Telephone Encounter (Signed)
Physical therapist Mrs. Lori Henson is calling concerning pt. She thinks that the patient is needing referral to see a back specialist or pain clinic. Pt is having severe back pain.  Please call Mrs. Collins the physical therapist at [phone] or office [phone]

## 2012-12-17 ENCOUNTER — Telehealth: Payer: Self-pay | Admitting: Internal Medicine

## 2012-12-17 NOTE — Telephone Encounter (Signed)
I faxed a order today that you signed (not sure if that was for the same thing)

## 2012-12-17 NOTE — Telephone Encounter (Signed)
ok 

## 2012-12-17 NOTE — Telephone Encounter (Signed)
Spoke with [employer] & verbal order given. She also states that she needs a verbal order for social work-please advise

## 2012-12-17 NOTE — Telephone Encounter (Signed)
Left verbal order on Christina's voicemail

## 2012-12-17 NOTE — Telephone Encounter (Signed)
I don't think so, since they are calling today.  Usually they have to get verbal ok before sending form to sign.  I am not sure.

## 2012-12-17 NOTE — Telephone Encounter (Signed)
Wanting to extend nursing visits to once a week for two weeks.

## 2013-01-03 ENCOUNTER — Ambulatory Visit: Payer: Self-pay | Admitting: Oncology

## 2013-01-03 LAB — COMPREHENSIVE METABOLIC PANEL
Albumin: 3.2 g/dL — ABNORMAL LOW (ref 3.4–5.0)
Alkaline Phosphatase: 148 U/L — ABNORMAL HIGH (ref 50–136)
Anion Gap: 9 (ref 7–16)
Bilirubin,Total: 0.4 mg/dL (ref 0.2–1.0)
Calcium, Total: 8.5 mg/dL (ref 8.5–10.1)
Co2: 29 mmol/L (ref 21–32)
EGFR (Non-African Amer.): >60
Glucose: 150 mg/dL — ABNORMAL HIGH (ref 65–99)
Osmolality: 274 (ref 275–301)
Sodium: 136 mmol/L (ref 136–145)
Total Protein: 7 g/dL (ref 6.4–8.2)

## 2013-01-03 LAB — CBC CANCER CENTER
Basophil #: 0.1 x10 3/mm (ref 0.0–0.1)
Basophil %: 0.7 %
Eosinophil #: 0.1 x10 3/mm (ref 0.0–0.7)
Eosinophil %: 1 %
HGB: 13.5 g/dL (ref 12.0–16.0)
Lymphocyte #: 1 x10 3/mm (ref 1.0–3.6)
MCHC: 33.5 g/dL (ref 32.0–36.0)
Monocyte #: 0.5 x10 3/mm (ref 0.2–0.9)
Monocyte %: 7.4 %
Neutrophil %: 77.7 %

## 2013-01-05 ENCOUNTER — Telehealth: Payer: Self-pay | Admitting: Internal Medicine

## 2013-01-05 ENCOUNTER — Ambulatory Visit: Payer: Self-pay | Admitting: Oncology

## 2013-01-05 ENCOUNTER — Encounter: Payer: Self-pay | Admitting: *Deleted

## 2013-01-05 NOTE — Telephone Encounter (Signed)
Yesterday Dr. Koleen Nimrod order a bone scan for the patient. She will go on 10.3.14

## 2013-01-05 NOTE — Telephone Encounter (Signed)
FYI

## 2013-01-05 NOTE — Telephone Encounter (Signed)
Noted. Await orders

## 2013-01-05 NOTE — Telephone Encounter (Signed)
Calling to inform that they are recertifying pt for physical therapy.  Will send orders over for Dr. Lorin Picket to sign.  Please call Ms. Collins with any questions.

## 2013-01-06 NOTE — Telephone Encounter (Signed)
another [employer]

## 2013-01-21 ENCOUNTER — Telehealth: Payer: Self-pay | Admitting: *Deleted

## 2013-01-21 ENCOUNTER — Telehealth: Payer: Self-pay | Admitting: Internal Medicine

## 2013-01-21 NOTE — Telephone Encounter (Signed)
Pt states that she is very very weak & doesn't feel that she can go that long (feels the worse she has ever been). She thinks she may need to head to the ER.  Pt states she will go to the ER & call on Monday with an update & will schedule an appt. to see Dr. Lorin Picket next week.

## 2013-01-21 NOTE — Telephone Encounter (Signed)
FYI

## 2013-01-21 NOTE — Telephone Encounter (Signed)
error 

## 2013-01-21 NOTE — Telephone Encounter (Signed)
Noted  

## 2013-01-21 NOTE — Telephone Encounter (Signed)
Please call and confirm no acute symptoms this pm.  Sounds like she may need to come in for appt to see me to discuss her weakness.

## 2013-01-21 NOTE — Telephone Encounter (Signed)
Mr Lori Henson called to let you know that Lori Henson saw Dr. Yves Dill for lower back pain. He says that the conitnued weakness is not related to the Fentanyl patches that he prescribed. Was told to contact you for advice &/or assessment. (Notes from Dr. Yves Dill in your folder)

## 2013-01-28 ENCOUNTER — Encounter: Payer: Self-pay | Admitting: Internal Medicine

## 2013-01-28 ENCOUNTER — Ambulatory Visit (INDEPENDENT_AMBULATORY_CARE_PROVIDER_SITE_OTHER): Payer: Medicare Other | Admitting: Internal Medicine

## 2013-01-28 VITALS — BP 130/80 | HR 79 | Temp 97.7°F | Ht 66.0 in | Wt 114.5 lb

## 2013-01-28 DIAGNOSIS — C439 Malignant melanoma of skin, unspecified: Secondary | ICD-10-CM

## 2013-01-28 DIAGNOSIS — IMO0002 Reserved for concepts with insufficient information to code with codable children: Secondary | ICD-10-CM

## 2013-01-28 DIAGNOSIS — K5900 Constipation, unspecified: Secondary | ICD-10-CM

## 2013-01-28 DIAGNOSIS — K5909 Other constipation: Secondary | ICD-10-CM

## 2013-01-28 DIAGNOSIS — F329 Major depressive disorder, single episode, unspecified: Secondary | ICD-10-CM

## 2013-01-28 DIAGNOSIS — R600 Localized edema: Secondary | ICD-10-CM

## 2013-01-28 DIAGNOSIS — E871 Hypo-osmolality and hyponatremia: Secondary | ICD-10-CM

## 2013-01-28 DIAGNOSIS — R609 Edema, unspecified: Secondary | ICD-10-CM

## 2013-01-28 DIAGNOSIS — I10 Essential (primary) hypertension: Secondary | ICD-10-CM

## 2013-01-31 ENCOUNTER — Encounter: Payer: Self-pay | Admitting: Internal Medicine

## 2013-01-31 DIAGNOSIS — IMO0002 Reserved for concepts with insufficient information to code with codable children: Secondary | ICD-10-CM | POA: Insufficient documentation

## 2013-01-31 DIAGNOSIS — D489 Neoplasm of uncertain behavior, unspecified: Secondary | ICD-10-CM | POA: Insufficient documentation

## 2013-01-31 NOTE — Progress Notes (Signed)
Subjective:    Patient ID: Lori Henson, female    DOB: Jan 19, 1920, 79 y.o.   MRN: [id number]  HPI 78 year old female with past history of lichen planus, hypertension, osteoporosis, malignant melanoma (followed at the cancer center) and bradycardia (s/p pacemaker placement).  She comes in today for a scheduled follow up.  On 11/05/12, she walked to her bathroom and fell.  Sprained her left ankle.  She did hit her head.  Went to ER.  xrays and scans - ok.  No fracture or acute abnormality.  Home health arranged.  She subsequently had increased pain in her back.   has been seeing Dr Yves Dill.  This has really set her back.  She is limited in her mobility around the house.  Currently receiving in home physical therapy.  They are going to release her.  Pain present despite various pain meds.  She is on Fentanyl patch now ( ).  Was titrated up in hopes of controlling her pain.  Does not appear to be making a difference.  Still seeing Dr Yves Dill.  Some nausea and decreased appetite.  See her list of concerns.  Weight loss.  She is seeing Dr Imogene Burn.  He is changing some of her medications.  She has some increased anxiety.  Takes clonazepam prn.  On mirtazipine at night.  Sleeps ok on this regimen.  Still limited mobility around the house. Is weak.  Reports she is tired.  She is accompanied by her husband.  History obtained from both of them.    Past Medical History  Diagnosis Date  . Malignant melanoma 2001    right leg, s/p radiation, recurrence 2008  . Hypertension   . 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 Lichen planus   . Angular cheilitis   . Osteoporosis     fosamax, compression fx s/p kyphoplasty    Outpatient Encounter Prescriptions as of 01/28/2013  Medication Sig Dispense Refill  . calcium carbonate (TUMS EX) 750 MG chewable tablet Chew 1 tablet by mouth 2 (two) times daily.      Jennette Banker Sodium 30-100 MG CAPS Take by mouth daily.       . Cholecalciferol ([employer]S VITAMIN D3) 1000  UNITS capsule Take 1,000 Units by mouth daily.      . clonazePAM (KLONOPIN) 0.5 MG tablet 0.5 mg. Takes 1 1/2 tablet qhs      . fentaNYL (DURAGESIC - DOSED MCG/HR) 50 MCG/HR Place 1 patch onto the skin every 3 (three) days.      Marland Kitchen L-Methylfolate (DEPLIN) 15 MG TABS Take by mouth.      . lactulose (CHRONULAC) 10 GM/15ML solution Take 30 milliliters by mouth once a day prn  500 mL  5  . mirtazapine (REMERON) 15 MG tablet Take 30 mg by mouth.       . Multiple Vitamins-Minerals (ICAPS) CAPS Take by mouth 2 (two) times daily.        No facility-administered encounter medications on file as of 01/28/2013.    Review of Systems Patient denies any headache, lightheadedness or dizziness.  No significant sinus or allergy symptoms.  No chest pain, tightness or palpitations.  No increased shortness of breath, cough or congestion.  Does report nausea and decreased appetite.   No acid reflux.  Has chronic constipation - better on Lactulose and prunes.  Good and bad days.  No BRBPR or melana.  No urine change.  Still with increased ankle pain.  Has improved some.  Still with limited  mobility.  Weak.  Losing weight.  No appetite.        Objective:   Physical Exam  Filed Vitals:   01/28/13 1433  BP: 130/80  Pulse: 79  Temp: 97.7 F (50.75 C)   79 year old female in no acute distress.   HEENT:  Nares- clear.  Oropharynx - without lesions. NECK:  Supple.  Nontender.  No audible bruit.  HEART:  Appears to be regular. LUNGS:  No crackles or wheezing audible.  Respirations even and unlabored.  RADIAL PULSE:  Equal bilaterally.  ABDOMEN:  Soft, nontender.  Bowel sounds present and normal.  No audible abdominal bruit.    EXTREMITIES:  No increased edema.  Stable ankle edema.  No increased erythema.   SKIN:  Skin lesion - right ankle.  No pain.             Assessment & Plan:  MSK.  Increased back pain despite multiple pain meds.  Now on Fentanyl.  Discussed dosing and possible side effects.  If she feels  no improvement on this dose, would recommend decreasing to .  May lessen side effects.   Limited mobility.  Needs help with ADLs.    PULMONARY.  Breathing stable.  Follow.   HEALTH MAINTENANCE.  Physical 06/03/12.  Colonoscopy 02/06/04 normal.  Mammogram 08/23/12 - Birads II.   DISPOSITION.  Increased weakness and limited mobility. Decreased appetite and weight loss. Discussed at length with her and her husband.  Her health has significantly declined over the last few months.  Discussed options, including Hospice.  They will discuss and notify me if they would like for me to pursue getting this arranged.     I spent 40 minutes with the patient and her husband and more than 50% of the time was spent in consultation regarding the above.

## 2013-01-31 NOTE — Assessment & Plan Note (Signed)
Seeing Dr Imogene Burn.  He is adjusting her medications.  Continue to follow up with Dr Imogene Burn.

## 2013-01-31 NOTE — Assessment & Plan Note (Signed)
Sees Dr Doylene Canning.  Had PET scan 12/30/11 - negative.  Follow.

## 2013-01-31 NOTE — Assessment & Plan Note (Signed)
Takes lactulose.  Now eating prunes.  Good and bad days.  Discussed using and enema if she goes a few days without a bowel movement.  Have disucssed referral back to GI for further treatment recommendations.  She declines.  Will follow.

## 2013-01-31 NOTE — Assessment & Plan Note (Signed)
Blood pressure appears to be ok on no medication.  Will remain off for now.  Have her continue to spot check her pressure.  Follow metabolic panel.  Follow pressures.

## 2013-01-31 NOTE — Assessment & Plan Note (Signed)
Support hose.  Stable.  Keep legs elevated when sitting.   

## 2013-01-31 NOTE — Assessment & Plan Note (Signed)
Off chlorthalidone.  Recent sodium slightly decreased.  Recheck improved.  Follow.

## 2013-01-31 NOTE — Assessment & Plan Note (Signed)
Decreased appetite.  Nausea.  Weight loss.  Continue to encourage increased po intake.  Ensure.

## 2013-02-01 ENCOUNTER — Telehealth: Payer: Self-pay | Admitting: Internal Medicine

## 2013-02-01 NOTE — Telephone Encounter (Signed)
Lori Henson spouse called  Stating they wanted to do hospice homehealth care Please call pt to discuss They have dr appointment today and will not be home between 3 - 4:30

## 2013-02-02 NOTE — Telephone Encounter (Signed)
Noted  

## 2013-02-02 NOTE — Telephone Encounter (Signed)
Left message. Unable to reach

## 2013-02-02 NOTE — Telephone Encounter (Signed)
Spoke to Ms Mirkin.  She is agreeable with hospice.  Spoke to [place] with Hospice.  Information given.  Please forward my last office note, demographics and rx (I will give you) - to Hospice (fax  [phone]).  Thanks.

## 2013-02-02 NOTE — Telephone Encounter (Signed)
Referral faxed

## 2013-02-05 ENCOUNTER — Ambulatory Visit: Payer: Self-pay | Admitting: Oncology

## 2013-02-07 ENCOUNTER — Ambulatory Visit: Payer: Medicare Other | Admitting: Internal Medicine

## 2013-02-08 NOTE — Telephone Encounter (Signed)
Hospice admitted patient on 02/03/13 under their services.

## 2013-02-10 ENCOUNTER — Other Ambulatory Visit: Payer: Self-pay

## 2013-02-17 ENCOUNTER — Encounter: Payer: Self-pay | Admitting: Internal Medicine

## 2013-02-17 ENCOUNTER — Ambulatory Visit (INDEPENDENT_AMBULATORY_CARE_PROVIDER_SITE_OTHER): Payer: Medicare Other | Admitting: Internal Medicine

## 2013-02-17 VITALS — BP 120/80 | HR 73 | Temp 97.7°F | Ht 66.0 in | Wt 117.0 lb

## 2013-02-17 DIAGNOSIS — C439 Malignant melanoma of skin, unspecified: Secondary | ICD-10-CM

## 2013-02-17 DIAGNOSIS — F3289 Other specified depressive episodes: Secondary | ICD-10-CM

## 2013-02-17 DIAGNOSIS — Z23 Encounter for immunization: Secondary | ICD-10-CM

## 2013-02-17 DIAGNOSIS — I10 Essential (primary) hypertension: Secondary | ICD-10-CM

## 2013-02-17 DIAGNOSIS — IMO0002 Reserved for concepts with insufficient information to code with codable children: Secondary | ICD-10-CM

## 2013-02-17 DIAGNOSIS — G2581 Restless legs syndrome: Secondary | ICD-10-CM

## 2013-02-17 DIAGNOSIS — K5909 Other constipation: Secondary | ICD-10-CM

## 2013-02-17 DIAGNOSIS — F329 Major depressive disorder, single episode, unspecified: Secondary | ICD-10-CM

## 2013-02-17 DIAGNOSIS — K5900 Constipation, unspecified: Secondary | ICD-10-CM

## 2013-02-17 NOTE — Progress Notes (Signed)
Pre-visit discussion using our clinic review tool. No additional management support is needed unless otherwise documented below in the visit note.  

## 2013-02-18 ENCOUNTER — Encounter: Payer: Self-pay | Admitting: Internal Medicine

## 2013-02-18 DIAGNOSIS — G2581 Restless legs syndrome: Secondary | ICD-10-CM | POA: Insufficient documentation

## 2013-02-18 NOTE — Assessment & Plan Note (Signed)
Previous decreased appetite.  Nausea.  Weight loss.  Hospice following.  She is eating better.  Appetite has improved some.  Follow.

## 2013-02-18 NOTE — Assessment & Plan Note (Signed)
Had a couple of nights recently with restless legs.  Took magnesium last night.  No problems last night.  Will continue the magnesium.  If has persistent problems, will check labs and she is to notify me.

## 2013-02-18 NOTE — Assessment & Plan Note (Signed)
Blood pressure appears to be ok on no medication.  Will remain off for now.  Have her continue to spot check her pressure.  Follow metabolic panel.  Follow pressures.   

## 2013-02-18 NOTE — Assessment & Plan Note (Signed)
Sees Dr Choksi.  Had PET scan 12/30/11 - negative.  Follow.    

## 2013-02-18 NOTE — Assessment & Plan Note (Signed)
Has been seeing Dr Imogene Burn.  He has been adjusting her medications.  She desires to change psychiatrist.  Request to see Dr Lucianne Muss.  Having some issues with increased stress and anxiety.

## 2013-02-18 NOTE — Assessment & Plan Note (Signed)
Takes lactulose.  Now eating prunes.  Good and bad days.  Discussed using and enema if she goes a few days without a bowel movement.  Have disucssed referral back to GI for further treatment recommendations.  She declines.  Will follow.  Magnesium may help this as well.

## 2013-02-18 NOTE — Progress Notes (Signed)
Subjective:    Patient ID: Lori Henson, female    DOB: [birth year], 79 y.o.   MRN: [id number]  HPI 79 year old female with past history of lichen planus, hypertension, osteoporosis, malignant melanoma (followed at the cancer center) and bradycardia (s/p pacemaker placement).  She comes in today as a work in with concerns regarding restless legs.  She also wanted to discuss seeing a new psychiatrist.  On 11/05/12, she walked to her bathroom and fell.  Sprained her left ankle.  She did hit her head.  Went to ER.  xrays and scans - ok.  No fracture or acute abnormality.  Home health arranged.  She subsequently had increased pain in her back.   Has been seeing Dr Yves Dill.  This has really set her back.  She is limited in her mobility around the house.  Currently receiving hospice at home.  She had been having some nausea and decreased appetite.  Weight loss.  Was set up with hospice secondary to malnutrition.  see my last note for details.  She has been seeing Dr Imogene Burn.  He has been changing some of her medications.  She has some increased anxiety.  Takes clonazepam prn.  On mirtazipine at night.  Sleeps ok on this regimen until the last couple of nights.  She experienced some restless legs.  Took magnesium last night and slept better.  She is eating some better now.  Appetite is better.  She is accompanied by her husband.  History obtained from both of them.  She expresses the desire to see a different psychiatrist.  She provides me with a name of psychiatrist she wishes to see.  Increased stress and anxiety.  Worries.     Past Medical History  Diagnosis Date  . Malignant melanoma 2001    right leg, s/p radiation, recurrence 2008  . Hypertension   . 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 Lichen planus   . Angular cheilitis   . Osteoporosis     fosamax, compression fx s/p kyphoplasty    Outpatient Encounter Prescriptions as of 02/17/2013  Medication Sig  . calcium carbonate (TUMS EX) 750 MG chewable  tablet Chew 1 tablet by mouth 2 (two) times daily.  Jennette Banker Sodium 30-100 MG CAPS Take by mouth daily.   . Cholecalciferol ([employer]S VITAMIN D3) 1000 UNITS capsule Take 1,000 Units by mouth daily.  . clonazePAM (KLONOPIN) 0.5 MG tablet 0.5 mg. Takes 1 1/2 tablet qhs  . fentaNYL (DURAGESIC - DOSED MCG/HR) 50 MCG/HR Place 37.5 mcg onto the skin every 3 (three) days.   Marland Kitchen L-Methylfolate (DEPLIN) 15 MG TABS Take by mouth.  . lactulose (CHRONULAC) 10 GM/15ML solution Take 30 milliliters by mouth once a day prn  . mirtazapine (REMERON) 15 MG tablet Take 30 mg by mouth.   . Multiple Vitamins-Minerals (ICAPS) CAPS Take by mouth 2 (two) times daily.     Review of Systems Patient denies any headache, lightheadedness or dizziness.  No significant sinus or allergy symptoms.  No chest pain, tightness or palpitations.  No increased shortness of breath, cough or congestion.  Appetite has improved some.  No nausea reported.   No acid reflux.  Has chronic constipation - better on Lactulose and prunes.  Good and bad days.  No BRBPR or melana.  No urine change.  Increased stress and anxiety.  Wishes to see another psychiatrist.  Restless legs as outlined.         Objective:   Physical Exam  Filed Vitals:   02/17/13 1542  BP: 120/80  Pulse: 73  Temp: 97.7 F (23.32 C)   79 year old female in no acute distress.   HEENT:  Oropharynx - without lesions. NECK:  Supple.  Nontender.  No audible bruit.  HEART:  Appears to be regular. LUNGS:  No crackles or wheezing audible.  Respirations even and unlabored.  RADIAL PULSE:  Equal bilaterally.  ABDOMEN:  Soft, nontender.  Bowel sounds present and normal.  No audible abdominal bruit.    EXTREMITIES:  No increased edema.  Stable ankle edema.  No increased erythema.             Assessment & Plan:  MSK.  Increased back pain despite multiple pain meds.  Now on Fentanyl.  Seeing Dr Yves Dill.   PULMONARY.  Breathing stable.  Follow.   HEALTH MAINTENANCE.   Physical 06/03/12.  Colonoscopy 02/06/04 normal.  Mammogram 08/23/12 - Birads II.   DISPOSITION.  Hospice in place.  Appetite has improved some.  Eating better.  Follow.      I spent 25 minutes with the patient and her husband and more than 50% of the time was spent in consultation regarding the above.

## 2013-03-17 ENCOUNTER — Telehealth: Payer: Self-pay | Admitting: Internal Medicine

## 2013-03-17 NOTE — Telephone Encounter (Signed)
Asking if Dr. Lorin Picket would consider appetite stimulant for pt.  Not prednisone.  Takes mirtazepine.  Pt is not eating.  Has one Ensure per day.  Has no energy.  Suggested Marinol.

## 2013-03-18 NOTE — Telephone Encounter (Signed)
Called and spoke to [place] about Ms [place].  Expressed my concern regarding placing her on marinol with her history of falls, age and being on remeron.  Will treat her nausea and depression and see if appetite comes back with these measures first.

## 2013-03-18 NOTE — Telephone Encounter (Signed)
Please advise 

## 2013-03-21 ENCOUNTER — Telehealth: Payer: Self-pay | Admitting: *Deleted

## 2013-03-21 MED ORDER — LACTULOSE 10 GM/15ML PO SOLN: ORAL | Status: DC

## 2013-03-21 NOTE — Telephone Encounter (Signed)
Left message for Arline Asp, notifying of Rx and change to directions. Rx sent to pharmacy by escript

## 2013-03-21 NOTE — Telephone Encounter (Signed)
Ok to refill lactulose x 3.  Ok to change directions to 30ml bid (one month with 3 refills).

## 2013-03-21 NOTE — Telephone Encounter (Signed)
Needs a refill of Lactulose. Also wants to increase dose to 30-39ml or 30ml BID-Please advise

## 2013-04-06 ENCOUNTER — Telehealth: Payer: Self-pay | Admitting: *Deleted

## 2013-04-06 NOTE — Telephone Encounter (Signed)
I spoke to [name], Lori and Lori Henson at length.  Pt desires (at this time) no further testing or office visits.  We discussed prednisone.  She wants to try this.  Will try 10mg  q day.  She is aware of risk of prednisone therapy.  Lori Henson also discussed with me regarding placement in the Lori Henson for pain control and then d/c Henson.  Wants to try prednisone first.  Lori and Lori Un comfortable with this plan

## 2013-04-06 NOTE — Telephone Encounter (Signed)
Arline Asp is Ms [facility] hospice nurse.  I would prefer not to use prednisone given her history of fracture.  If she is continuing to have the back pain, stomach issues, etc - I would like for GI to reevaluate her and see if there is something more we can do to help her symptoms (and to be more comfortable).

## 2013-04-06 NOTE — Telephone Encounter (Signed)
Duplicate.  See attached note.   

## 2013-04-06 NOTE — Telephone Encounter (Signed)
Correction-pt unable to get out anywhere to even come to an office appointment

## 2013-04-06 NOTE — Telephone Encounter (Signed)
Pt is unable to go anywhere. Go not come to doctors office-Please advise what to do next

## 2013-04-06 NOTE — Telephone Encounter (Signed)
Started her on Compazine about 3-4 wks ago due to nausea. Also c/o back pain from a fall in [place]. Would like to get a order for Prednisone to help increase appetite & reduce inflammation in back. Pt is not eating much for a few days now. Needs Rx sent to [facility] [address] . Please call Arline Asp also to let her know what was sent in so it can be profiled.

## 2013-04-21 ENCOUNTER — Telehealth: Payer: Self-pay | Admitting: *Deleted

## 2013-04-21 NOTE — Telephone Encounter (Signed)
Left detailed message on Lori Henson's voicemail

## 2013-04-21 NOTE — Telephone Encounter (Signed)
Lori Henson called to inform you that Lori Henson has been on Prednisone 10mg  daily for about 2 weeks now, & she has noticed that she is now experiencing excessive perspiration. Please advise rather they should continue or d/c the medication.

## 2013-04-21 NOTE — Telephone Encounter (Signed)
Can d/c the medication.  Can take 1/2 tablet q day for one week and then 1/2 tablet qod for one week and then stop.

## 2013-04-29 ENCOUNTER — Telehealth: Payer: Self-pay | Admitting: Internal Medicine

## 2013-04-29 NOTE — Telephone Encounter (Signed)
She called to voice her concern since the patient is no longer able to come into the doctors office setting. She would like to discuss getting patient set up with Dr.Letvak at [facility] since he does house calls.

## 2013-04-29 NOTE — Telephone Encounter (Signed)
I am ok with this if pt is ok.

## 2013-04-29 NOTE — Telephone Encounter (Signed)
Left Cindy a voicemail with response

## 2013-05-02 NOTE — Telephone Encounter (Signed)
[facility] calling for refill on fentanyl patch.  Asking if we can fax over script.

## 2013-05-02 NOTE — Telephone Encounter (Signed)
Dr Sharlet Salina has been prescribing this medication.  I am not sure what dose she is on now.  Is he still writing the prescriptions?  Just let me know what I need to do.

## 2013-05-03 NOTE — Telephone Encounter (Signed)
Spoke with [facility] with Hospice & she is not sure who called & requested that refill for the patch but Dr. Sharlet Salina is still prescribing that medication & she will contact his office.

## 2013-05-27 ENCOUNTER — Telehealth: Payer: Self-pay | Admitting: Internal Medicine

## 2013-05-27 DIAGNOSIS — R42 Dizziness and giddiness: Secondary | ICD-10-CM

## 2013-05-27 DIAGNOSIS — H938X9 Other specified disorders of ear, unspecified ear: Secondary | ICD-10-CM

## 2013-05-27 MED ORDER — CIPROFLOXACIN HCL 250 MG PO TABS: 250.0000 mg | ORAL_TABLET | Freq: Two times a day (BID) | ORAL | Status: DC

## 2013-05-27 NOTE — Telephone Encounter (Signed)
Noted  

## 2013-05-27 NOTE — Telephone Encounter (Signed)
Urinalysis results received: Appearance:Turbid, WBC Estrace: 3+ abnormal, Occult Blood: 1+ abnormal, WBC: leucoytes present >30 abnormal, RBC: Epithelial cell 11-30 abnormal  Urine Culture: Will follow  Per Dr. Grandville Silos Cipro 250mg  BID x 5 days to [facility]. [place] (may need to change later depending on culture results) & Cindy with Hospice notified

## 2013-05-27 NOTE — Telephone Encounter (Signed)
Verbal order given  

## 2013-05-27 NOTE — Telephone Encounter (Signed)
Cindy with [facility] left vm.  States pt is suspected to have UTI.  Asking for verbal order to run urinalysis, culture and sensitivity.

## 2013-05-30 ENCOUNTER — Telehealth: Payer: Self-pay | Admitting: *Deleted

## 2013-05-30 ENCOUNTER — Other Ambulatory Visit: Payer: Self-pay | Admitting: *Deleted

## 2013-05-30 MED ORDER — SULFAMETHOXAZOLE-TMP DS 800-160 MG PO TABS: 1.0000 | ORAL_TABLET | Freq: Two times a day (BID) | ORAL | Status: DC

## 2013-05-30 NOTE — Telephone Encounter (Signed)
Confirm no allergy to sulfa.  If no, then stop cipro and start bactrim DS bid x 5 days.  Also, need to confirm with hospice.  They had sent me a message stating she was switching care to Dr Silvio Pate because he makes [employer].  Is this still the case, because if he is following then these results need to go to him.  I do not mind continuing to see her, but I don't think two MDs need to be doing the same thing.

## 2013-05-30 NOTE — Telephone Encounter (Signed)
Spoke with Lori Henson) she is aware of the abx change. She also states that pt is not ambulatory again & wants to keep you as her provider. She has not seen Dr. Silvio Pate. She also reports that pt c/o dizziness when in bed or rolling over & ears feel full. Wants to know if she needs to be seen here, prescribed something, or can you do a ENT referral for her? Please advise

## 2013-05-30 NOTE — Telephone Encounter (Signed)
Urine Culture: Coagulase negative staphylococcus species, not staphylococcus saprophyticus.  Ciprofloxacin-R Gentamicin-S Levofloxacin-I Nitrofurantoin-S Oxavillin-R Penicillin-R Rifampin-S Tetracycline-S Trimethoprim/Sulfa-S Vancomycin-S  Pt currently on Cipro 250mg  BID x 5 days-please advise

## 2013-05-31 ENCOUNTER — Telehealth: Payer: Self-pay | Admitting: Internal Medicine

## 2013-05-31 NOTE — Telephone Encounter (Signed)
This was already addressed yesterday (see phone note)

## 2013-05-31 NOTE — Telephone Encounter (Signed)
Cindy/Hospice left vm.  States pt was rx cipro for UTI on Friday by Dr. Nicki Reaper.  C&S came back resistant to Cipro.  Pt also complaining of increased dizziness.  ?referral to ENT.  Please contact Cindy at [facility]

## 2013-05-31 NOTE — Telephone Encounter (Signed)
I placed order for ENT referral.  Lori Henson should be contacting her with an appt.  This may save two visits.

## 2013-05-31 NOTE — Telephone Encounter (Signed)
duplicate (see other message)

## 2013-07-04 ENCOUNTER — Ambulatory Visit: Payer: Self-pay | Admitting: Oncology

## 2013-07-04 LAB — CBC CANCER CENTER
BASOS ABS: 0.1 x10 3/mm (ref 0.0–0.1)
Basophil %: 1.4 %
EOS PCT: 2.5 %
Eosinophil #: 0.2 x10 3/mm (ref 0.0–0.7)
HCT: 38.3 % (ref 35.0–47.0)
HGB: 12.2 g/dL (ref 12.0–16.0)
Lymphocyte #: 1.5 x10 3/mm (ref 1.0–3.6)
Lymphocyte %: 25.1 %
MCH: 30.4 pg (ref 26.0–34.0)
MCHC: 32 g/dL (ref 32.0–36.0)
MCV: 95 fL (ref 80–100)
MONOS PCT: 9.8 %
Monocyte #: 0.6 x10 3/mm (ref 0.2–0.9)
Neutrophil #: 3.7 x10 3/mm (ref 1.4–6.5)
Neutrophil %: 61.2 %
Platelet: 206 x10 3/mm (ref 150–440)
RBC: 4.03 10*6/uL (ref 3.80–5.20)
RDW: 14.3 % (ref 11.5–14.5)
WBC: 6 x10 3/mm (ref 3.6–11.0)

## 2013-07-04 LAB — COMPREHENSIVE METABOLIC PANEL
ALK PHOS: 91 U/L
Albumin: 3.2 g/dL — ABNORMAL LOW (ref 3.4–5.0)
Anion Gap: 5 — ABNORMAL LOW (ref 7–16)
BUN: 22 mg/dL — AB (ref 7–18)
Bilirubin,Total: 0.3 mg/dL (ref 0.2–1.0)
CHLORIDE: 100 mmol/L (ref 98–107)
CO2: 34 mmol/L — AB (ref 21–32)
CREATININE: 0.97 mg/dL (ref 0.60–1.30)
Calcium, Total: 9.1 mg/dL (ref 8.5–10.1)
EGFR (African American): 59 — ABNORMAL LOW
EGFR (Non-African Amer.): 51 — ABNORMAL LOW
GLUCOSE: 121 mg/dL — AB (ref 65–99)
Osmolality: 282 (ref 275–301)
POTASSIUM: 4.5 mmol/L (ref 3.5–5.1)
SGOT(AST): 23 U/L (ref 15–37)
SGPT (ALT): 17 U/L (ref 12–78)
Sodium: 139 mmol/L (ref 136–145)
Total Protein: 7.2 g/dL (ref 6.4–8.2)

## 2013-07-06 ENCOUNTER — Ambulatory Visit: Payer: Self-pay | Admitting: Oncology

## 2013-07-20 ENCOUNTER — Encounter: Payer: Self-pay | Admitting: Internal Medicine

## 2013-07-27 ENCOUNTER — Telehealth: Payer: Self-pay | Admitting: *Deleted

## 2013-07-27 NOTE — Telephone Encounter (Signed)
Cindy with Hospice called to report that Lori Henson will be discharged on Monday because she is no longer appropriate. Her weight is now stable at 122lbs.

## 2013-07-27 NOTE — Telephone Encounter (Signed)
Also received a fax with same information (placed in your folder)

## 2013-07-27 NOTE — Telephone Encounter (Signed)
Noted  

## 2013-08-23 ENCOUNTER — Ambulatory Visit (INDEPENDENT_AMBULATORY_CARE_PROVIDER_SITE_OTHER): Payer: Medicare Other | Admitting: Adult Health

## 2013-08-23 ENCOUNTER — Encounter: Payer: Self-pay | Admitting: Adult Health

## 2013-08-23 VITALS — BP 124/70 | HR 73 | Temp 98.0°F | Resp 14 | Wt 113.0 lb

## 2013-08-23 DIAGNOSIS — D492 Neoplasm of unspecified behavior of bone, soft tissue, and skin: Secondary | ICD-10-CM

## 2013-08-23 DIAGNOSIS — C4492 Squamous cell carcinoma of skin, unspecified: Secondary | ICD-10-CM | POA: Insufficient documentation

## 2013-08-23 NOTE — Progress Notes (Signed)
   Subjective:    Patient ID: Lori Henson, female    DOB: [birth year], 80 y.o.   MRN: [id number]  HPI Pt is a pleasant 80 y/o female with hx of melanoma on the right leg s/p resection and radiation. Recurrence in 2008. Pt presents with area on her right leg that has recently developed. First noticed it ~ 1 month ago. Her dermatologist is Dr. Kellie Moor. She called to schedule an appointment but could not get in until July. She is concerned given her melanoma hx.  Past Medical History  Diagnosis Date  . Malignant melanoma 2001    right leg, s/p radiation, recurrence 2008  . Hypertension   . 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 Lichen planus   . Angular cheilitis   . Osteoporosis     fosamax, compression fx s/p kyphoplasty    Review of Systems  Skin:       Growth on right lower leg. Previous melanoma site  All other systems reviewed and are negative.  BP 124/70  Pulse 73  Temp(Src) 98 F (36.7 C) (Oral)  Resp 14  Wt 113 lb (51.256 kg)  SpO2 97%     Objective:   Physical Exam  Constitutional: She is oriented to person, place, and time. No distress.  HENT:  Head: Normocephalic and atraumatic.  Eyes: Conjunctivae and EOM are normal.  Neck: Normal range of motion. Neck supple.  Cardiovascular: Normal rate and regular rhythm.   Pulmonary/Chest: Effort normal. No respiratory distress.  Musculoskeletal: Normal range of motion.  Neurological: She is alert and oriented to person, place, and time.  Skin:  Raised, dark area on right inner aspect of ankle. Hx of melanoma w/ recurrence in 2008.  Psychiatric: She has a normal mood and affect. Her behavior is normal. Judgment and thought content normal.      Assessment & Plan:   1. Abnormal skin growth Growth first noticed approximately 1 month ago. At previous site of melanoma. [facility]. Pt able to get appointment for Thursday at 2:15 pm.

## 2013-08-23 NOTE — Patient Instructions (Signed)
  Appointment with Dr. Kellie Moor on Thursday at 2:15 pm.   Please arrive at 2:00 pm

## 2013-08-23 NOTE — Progress Notes (Signed)
Pre visit review using our clinic review tool, if applicable. No additional management support is needed unless otherwise documented below in the visit note. 

## 2013-09-05 DIAGNOSIS — Z950 Presence of cardiac pacemaker: Secondary | ICD-10-CM | POA: Insufficient documentation

## 2013-09-05 DIAGNOSIS — G8929 Other chronic pain: Secondary | ICD-10-CM | POA: Insufficient documentation

## 2013-09-05 DIAGNOSIS — N368 Other specified disorders of urethra: Secondary | ICD-10-CM | POA: Insufficient documentation

## 2013-09-05 DIAGNOSIS — I4892 Unspecified atrial flutter: Secondary | ICD-10-CM | POA: Insufficient documentation

## 2013-09-05 DIAGNOSIS — R600 Localized edema: Secondary | ICD-10-CM | POA: Insufficient documentation

## 2013-11-10 ENCOUNTER — Other Ambulatory Visit: Payer: Self-pay | Admitting: Internal Medicine

## 2013-11-10 ENCOUNTER — Encounter: Payer: Self-pay | Admitting: Internal Medicine

## 2013-11-10 ENCOUNTER — Ambulatory Visit (INDEPENDENT_AMBULATORY_CARE_PROVIDER_SITE_OTHER): Payer: Medicare Other | Admitting: Internal Medicine

## 2013-11-10 VITALS — BP 110/80 | HR 65 | Temp 98.0°F | Wt 121.0 lb

## 2013-11-10 DIAGNOSIS — F329 Major depressive disorder, single episode, unspecified: Secondary | ICD-10-CM

## 2013-11-10 DIAGNOSIS — K5909 Other constipation: Secondary | ICD-10-CM

## 2013-11-10 DIAGNOSIS — C439 Malignant melanoma of skin, unspecified: Secondary | ICD-10-CM

## 2013-11-10 DIAGNOSIS — E871 Hypo-osmolality and hyponatremia: Secondary | ICD-10-CM

## 2013-11-10 DIAGNOSIS — K5900 Constipation, unspecified: Secondary | ICD-10-CM

## 2013-11-10 DIAGNOSIS — R2681 Unsteadiness on feet: Secondary | ICD-10-CM

## 2013-11-10 DIAGNOSIS — R600 Localized edema: Secondary | ICD-10-CM

## 2013-11-10 DIAGNOSIS — F3289 Other specified depressive episodes: Secondary | ICD-10-CM

## 2013-11-10 DIAGNOSIS — L989 Disorder of the skin and subcutaneous tissue, unspecified: Secondary | ICD-10-CM

## 2013-11-10 DIAGNOSIS — R609 Edema, unspecified: Secondary | ICD-10-CM

## 2013-11-10 DIAGNOSIS — C4492 Squamous cell carcinoma of skin, unspecified: Secondary | ICD-10-CM

## 2013-11-10 DIAGNOSIS — I10 Essential (primary) hypertension: Secondary | ICD-10-CM

## 2013-11-10 DIAGNOSIS — L659 Nonscarring hair loss, unspecified: Secondary | ICD-10-CM

## 2013-11-10 DIAGNOSIS — F32A Depression, unspecified: Secondary | ICD-10-CM

## 2013-11-10 DIAGNOSIS — R269 Unspecified abnormalities of gait and mobility: Secondary | ICD-10-CM

## 2013-11-10 DIAGNOSIS — M549 Dorsalgia, unspecified: Secondary | ICD-10-CM

## 2013-11-10 LAB — COMPREHENSIVE METABOLIC PANEL
ALBUMIN: 3.3 g/dL — AB (ref 3.5–5.2)
ALK PHOS: 89 U/L (ref 39–117)
ALT: 15 U/L (ref 0–35)
AST: 24 U/L (ref 0–37)
BILIRUBIN TOTAL: 0.5 mg/dL (ref 0.2–1.2)
BUN: 13 mg/dL (ref 6–23)
CO2: 32 mEq/L (ref 19–32)
CREATININE: 0.7 mg/dL (ref 0.4–1.2)
Calcium: 9.1 mg/dL (ref 8.4–10.5)
Chloride: 89 mEq/L — ABNORMAL LOW (ref 96–112)
GFR: 86.04 mL/min (ref >60.00)
GLUCOSE: 70 mg/dL (ref 70–99)
Potassium: 4.9 mEq/L (ref 3.5–5.1)
Sodium: 129 mEq/L — ABNORMAL LOW (ref 135–145)
Total Protein: 6.4 g/dL (ref 6.0–8.3)

## 2013-11-10 LAB — TSH: TSH: 1.52 u[IU]/mL (ref 0.35–4.50)

## 2013-11-10 LAB — CBC WITH DIFFERENTIAL/PLATELET
BASOS ABS: 0 10*3/uL (ref 0.0–0.1)
Basophils Relative: 0.5 % (ref 0.0–3.0)
EOS ABS: 0.1 10*3/uL (ref 0.0–0.7)
Eosinophils Relative: 2 % (ref 0.0–5.0)
HCT: 35.4 % — ABNORMAL LOW (ref 36.0–46.0)
HEMOGLOBIN: 11.7 g/dL — AB (ref 12.0–15.0)
LYMPHS ABS: 0.9 10*3/uL (ref 0.7–4.0)
LYMPHS PCT: 18.3 % (ref 12.0–46.0)
MCHC: 33.1 g/dL (ref 30.0–36.0)
MCV: 92.3 fl (ref 78.0–100.0)
Monocytes Absolute: 0.6 10*3/uL (ref 0.1–1.0)
Monocytes Relative: 12.5 % — ABNORMAL HIGH (ref 3.0–12.0)
Neutro Abs: 3.3 10*3/uL (ref 1.4–7.7)
Neutrophils Relative %: 66.7 % (ref 43.0–77.0)
Platelets: 209 10*3/uL (ref 150.0–400.0)
RBC: 3.84 Mil/uL — ABNORMAL LOW (ref 3.87–5.11)
RDW: 14.7 % (ref 11.5–15.5)
WBC: 4.9 10*3/uL (ref 4.0–10.5)

## 2013-11-10 NOTE — Progress Notes (Signed)
Pre visit review using our clinic review tool, if applicable. No additional management support is needed unless otherwise documented below in the visit note. 

## 2013-11-10 NOTE — Progress Notes (Signed)
Order placed for f/u sodium.  ?

## 2013-11-11 ENCOUNTER — Encounter: Payer: Self-pay | Admitting: Internal Medicine

## 2013-11-11 ENCOUNTER — Telehealth: Payer: Self-pay | Admitting: Internal Medicine

## 2013-11-11 ENCOUNTER — Encounter: Payer: Self-pay | Admitting: *Deleted

## 2013-11-11 DIAGNOSIS — L659 Nonscarring hair loss, unspecified: Secondary | ICD-10-CM | POA: Insufficient documentation

## 2013-11-11 DIAGNOSIS — M549 Dorsalgia, unspecified: Secondary | ICD-10-CM | POA: Insufficient documentation

## 2013-11-11 DIAGNOSIS — R269 Unspecified abnormalities of gait and mobility: Secondary | ICD-10-CM | POA: Insufficient documentation

## 2013-11-11 DIAGNOSIS — R2681 Unsteadiness on feet: Secondary | ICD-10-CM | POA: Insufficient documentation

## 2013-11-11 NOTE — Assessment & Plan Note (Signed)
States had squamous cell skin cancer removed from right ankle 10 weeks ago.  Persistent non healing area.  Will refer back to Dr Kellie Moor.

## 2013-11-11 NOTE — Telephone Encounter (Signed)
Letter typed, printed and signed.  Placed in your box.

## 2013-11-11 NOTE — Assessment & Plan Note (Signed)
Sees Dr Oliva Bustard.  Had PET scan 12/30/11 - negative.  Follow.  Seeing Dr Kellie Moor.

## 2013-11-11 NOTE — Assessment & Plan Note (Signed)
Check cbc and tsh.

## 2013-11-11 NOTE — Assessment & Plan Note (Signed)
Pain appears to be better.  Does have some issues with unsteady gait.  Residing at [facility] now.  They apparently have physical therapists.  Will refer to physical therapy for gait training, etc.

## 2013-11-11 NOTE — Assessment & Plan Note (Signed)
Blood pressure appears to be under control.  Has been started on metoprolol and hctz by cardiology.  Check metabolic panel.

## 2013-11-11 NOTE — Assessment & Plan Note (Signed)
Improved.  Has been seeing psychiatry.

## 2013-11-11 NOTE — Telephone Encounter (Signed)
Pt is now residing at [facility].  States they have physical therapists there.  Please call them and see what we need to do to have them evaluate pt for gait training for gait instability.  Can I just write a prescription and fax or do they need a specific form, office note, etc.  Just let me know what I need to do.  Thanks.

## 2013-11-11 NOTE — Telephone Encounter (Signed)
I spoke with someone at the [facility] & Lori Henson. They said that if should be okay to send a order via letter.

## 2013-11-11 NOTE — Assessment & Plan Note (Signed)
Appetite better.  Eating better.  Weight has improved.  Discharged from Hospice.

## 2013-11-11 NOTE — Assessment & Plan Note (Signed)
Persistent back pain.  Is better.  Off hydrocodone.  Seeing Dr Sharlet Salina.  Using Fentanyl patch.  Follow.

## 2013-11-11 NOTE — Assessment & Plan Note (Signed)
Need to recheck sodium.  (now back on hctz).

## 2013-11-11 NOTE — Assessment & Plan Note (Signed)
Takes lactulose.  Overall appear to be stable.

## 2013-11-11 NOTE — Assessment & Plan Note (Signed)
Support hose.  Stable.  Keep legs elevated when sitting.

## 2013-11-11 NOTE — Progress Notes (Signed)
Subjective:    Patient ID: Lori Henson, female    DOB: 01/12/1920, 80 y.o.   MRN: [id number]  HPI 80 year old female with past history of lichen planus, hypertension, osteoporosis, malignant melanoma (followed at the cancer center) and bradycardia (s/p pacemaker placement).  She comes in today for a scheduled follow up.  It has been a while since I have seen her.  Previously, we had set her up with hospice for failure to thrive, etc.  She is overall doing better.  Eating better.  Hospice has discharged her.  She is now living at the [facility] [address]  Gets meals there.  Pain is better.  She is off hydrocodone.  Still wearing Fentanyl patch.  If she stands, feels some better.  Pain worsens later in the day.  Still with some back pain.  Saw Dr Oliva Bustard four months ago.  Kyphoplasty was mentioned.  Overall now, appears to be better.  She does report some problems with her balance.  A little more unsteady gait.  Using a walker.  We discussed physicaly therapy.  She was in agreement.  She has been started on HCTZ and metoprolol by cardiology.  Denies any chest pain or tightness.  No sob.  Has noticed her hair thinning.   Overall she states she feels better.  Mood better.  She does report that she had a squamous cell skin lesion removed from her right ankle.  Lesion not healing.  Saw Dr Kellie Moor.  Has been 10 weeks now.     Past Medical History  Diagnosis Date  . Malignant melanoma 2001    right leg, s/p radiation, recurrence 2008  . Hypertension   . 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 Lichen planus   . Angular cheilitis   . Osteoporosis     fosamax, compression fx s/p kyphoplasty    Outpatient Encounter Prescriptions as of 11/10/2013  Medication Sig  . calcium carbonate (TUMS EX) 750 MG chewable tablet Chew 1 tablet by mouth 2 (two) times daily.  Sarajane Marek Sodium 30-100 MG CAPS Take by mouth daily.   . Cholecalciferol ([employer] VITAMIN D3) 1000 UNITS capsule Take 1,000 Units by mouth daily.  .  clonazePAM (KLONOPIN) 0.5 MG tablet Take 0.5 mg by mouth 2 (two) times daily as needed. Takes 1 1/2 tablet qhs  . fentaNYL (DURAGESIC - DOSED MCG/HR) 50 MCG/HR Place 37.5 mcg onto the skin every 3 (three) days.   . hydrochlorothiazide (HYDRODIURIL) 25 MG tablet Take 25 mg by mouth daily.  Marland Kitchen L-Methylfolate (DEPLIN) 15 MG TABS Take by mouth.  . metoprolol (LOPRESSOR) 50 MG tablet Take 50 mg by mouth daily.  . mirtazapine (REMERON) 15 MG tablet Take 30 mg by mouth.   . Multiple Vitamins-Minerals (ICAPS) CAPS Take by mouth 2 (two) times daily.   . Sorbitol SOLN by Does not apply route as needed.  Marland Kitchen HYDROcodone-acetaminophen (NORCO) 7.5-325 MG per tablet Take 1 tablet by mouth every 6 (six) hours as needed for moderate pain.  . [DISCONTINUED] lactulose (CHRONULAC) 10 GM/15ML solution Take 30 milliliters by mouth twice daily    Review of Systems Patient denies any headache, lightheadedness or dizziness.  No significant sinus or allergy symptoms.  No chest pain, tightness or palpitations.  No increased shortness of breath, cough or congestion.  Appetite has improved.  Weight is up. No nausea reported.   No acid reflux.  Has chronic constipation - better on Lactulose and prunes.  Had a bowel movement today.  No  BRBPR or melana.  No urine change.  Hair thinning as outlined.  Persistent ankle lesion.          Objective:   Physical Exam  Filed Vitals:   11/10/13 0938  BP: 110/80  Pulse: 65  Temp: 98 F (20.34 C)   80 year old female in no acute distress.   HEENT:  Oropharynx - without lesions.  Nares clear.  NECK:  Supple.  Nontender.  No audible bruit.  HEART:  Appears to be regular. LUNGS:  No crackles or wheezing audible.  Respirations even and unlabored.  RADIAL PULSE:  Equal bilaterally.  ABDOMEN:  Soft, nontender.  Bowel sounds present and normal.  No audible abdominal bruit.    EXTREMITIES:  No increased edema.  Stable ankle edema.  No increased erythema.    Small circular open wound  (approximately one cm) right medial ankle.           Assessment & Plan:  MSK.  On Fentanyl.  Seeing Dr Sharlet Salina.   PULMONARY.  Breathing stable.  Follow.   HEALTH MAINTENANCE.  Physical 06/03/12.  Colonoscopy 02/06/04 normal.  Mammogram 08/23/12 - Birads II.  Overdue.  Will need to arrange.    DISPOSITION.  Living at the [facility] now.       I spent 25 minutes with the patient and her husband and more than 50% of the time was spent in consultation regarding the above.

## 2013-11-14 NOTE — Telephone Encounter (Signed)
Order faxed to [facility].  

## 2013-11-15 ENCOUNTER — Other Ambulatory Visit (INDEPENDENT_AMBULATORY_CARE_PROVIDER_SITE_OTHER): Payer: Medicare Other

## 2013-11-15 ENCOUNTER — Encounter: Payer: Self-pay | Admitting: Internal Medicine

## 2013-11-15 DIAGNOSIS — E871 Hypo-osmolality and hyponatremia: Secondary | ICD-10-CM

## 2013-11-15 LAB — SODIUM: Sodium: 131 mEq/L — ABNORMAL LOW (ref 135–145)

## 2014-01-04 ENCOUNTER — Ambulatory Visit: Payer: Self-pay | Admitting: Oncology

## 2014-01-04 LAB — COMPREHENSIVE METABOLIC PANEL
ALBUMIN: 3.3 g/dL — AB (ref 3.4–5.0)
ALT: 24 U/L
AST: 22 U/L (ref 15–37)
Alkaline Phosphatase: 100 U/L
Anion Gap: 4 — ABNORMAL LOW (ref 7–16)
BUN: 17 mg/dL (ref 7–18)
Bilirubin,Total: 0.3 mg/dL (ref 0.2–1.0)
CALCIUM: 8.8 mg/dL (ref 8.5–10.1)
CHLORIDE: 92 mmol/L — AB (ref 98–107)
Co2: 35 mmol/L — ABNORMAL HIGH (ref 21–32)
Creatinine: 0.82 mg/dL (ref 0.60–1.30)
EGFR (Non-African Amer.): >60
GLUCOSE: 90 mg/dL (ref 65–99)
Osmolality: 264 (ref 275–301)
Potassium: 4 mmol/L (ref 3.5–5.1)
Sodium: 131 mmol/L — ABNORMAL LOW (ref 136–145)
TOTAL PROTEIN: 6.8 g/dL (ref 6.4–8.2)

## 2014-01-04 LAB — CBC CANCER CENTER
BASOS PCT: 2.5 %
Basophil #: 0.1 x10 3/mm (ref 0.0–0.1)
Eosinophil #: 0.2 x10 3/mm (ref 0.0–0.7)
Eosinophil %: 3.7 %
HCT: 35 % (ref 35.0–47.0)
HGB: 11.7 g/dL — AB (ref 12.0–16.0)
LYMPHS PCT: 24.6 %
Lymphocyte #: 1.3 x10 3/mm (ref 1.0–3.6)
MCH: 30.6 pg (ref 26.0–34.0)
MCHC: 33.4 g/dL (ref 32.0–36.0)
MCV: 92 fL (ref 80–100)
Monocyte #: 0.6 x10 3/mm (ref 0.2–0.9)
Monocyte %: 10.5 %
NEUTROS ABS: 3.1 x10 3/mm (ref 1.4–6.5)
NEUTROS PCT: 58.7 %
Platelet: 211 x10 3/mm (ref 150–440)
RBC: 3.82 10*6/uL (ref 3.80–5.20)
RDW: 13.8 % (ref 11.5–14.5)
WBC: 5.2 x10 3/mm (ref 3.6–11.0)

## 2014-01-05 ENCOUNTER — Ambulatory Visit: Payer: Self-pay | Admitting: Oncology

## 2014-02-02 ENCOUNTER — Inpatient Hospital Stay: Payer: Self-pay | Admitting: Internal Medicine

## 2014-02-02 LAB — URINALYSIS, COMPLETE
BILIRUBIN, UR: NEGATIVE
Bacteria: NONE SEEN
GLUCOSE, UR: NEGATIVE mg/dL (ref 0–75)
Nitrite: NEGATIVE
Ph: 8 (ref 4.5–8.0)
Protein: NEGATIVE
Specific Gravity: 1.012 (ref 1.003–1.030)
Squamous Epithelial: <1

## 2014-02-02 LAB — LIPASE, BLOOD: LIPASE: 122 U/L (ref 73–393)

## 2014-02-02 LAB — COMPREHENSIVE METABOLIC PANEL
ANION GAP: 12 (ref 7–16)
AST: 31 U/L (ref 15–37)
Albumin: 3.3 g/dL — ABNORMAL LOW (ref 3.4–5.0)
Alkaline Phosphatase: 138 U/L — ABNORMAL HIGH
BUN: 18 mg/dL (ref 7–18)
Bilirubin,Total: 0.7 mg/dL (ref 0.2–1.0)
CALCIUM: 8.5 mg/dL (ref 8.5–10.1)
CHLORIDE: 93 mmol/L — AB (ref 98–107)
CREATININE: 1.12 mg/dL (ref 0.60–1.30)
Co2: 30 mmol/L (ref 21–32)
EGFR (Non-African Amer.): 48 — ABNORMAL LOW
GFR CALC AF AMER: 59 — AB
GLUCOSE: 142 mg/dL — AB (ref 65–99)
OSMOLALITY: 274 (ref 275–301)
Potassium: 3.6 mmol/L (ref 3.5–5.1)
SGPT (ALT): 25 U/L
SODIUM: 135 mmol/L — AB (ref 136–145)
TOTAL PROTEIN: 7.7 g/dL (ref 6.4–8.2)

## 2014-02-02 LAB — CBC WITH DIFFERENTIAL/PLATELET
Basophil #: 0.1 10*3/uL (ref 0.0–0.1)
Basophil %: 0.5 %
EOS ABS: 0 10*3/uL (ref 0.0–0.7)
Eosinophil %: 0.4 %
HCT: 39.2 % (ref 35.0–47.0)
HGB: 12.7 g/dL (ref 12.0–16.0)
LYMPHS PCT: 5.6 %
Lymphocyte #: 0.6 10*3/uL — ABNORMAL LOW (ref 1.0–3.6)
MCH: 30 pg (ref 26.0–34.0)
MCHC: 32.5 g/dL (ref 32.0–36.0)
MCV: 92 fL (ref 80–100)
Monocyte #: 0.6 x10 3/mm (ref 0.2–0.9)
Monocyte %: 5.3 %
NEUTROS PCT: 88.2 %
Neutrophil #: 9.8 10*3/uL — ABNORMAL HIGH (ref 1.4–6.5)
Platelet: 207 10*3/uL (ref 150–440)
RBC: 4.25 10*6/uL (ref 3.80–5.20)
RDW: 13.8 % (ref 11.5–14.5)
WBC: 11.1 10*3/uL — ABNORMAL HIGH (ref 3.6–11.0)

## 2014-02-02 LAB — TROPONIN I: TROPONIN-I: 0.02 ng/mL

## 2014-02-03 DIAGNOSIS — R109 Unspecified abdominal pain: Secondary | ICD-10-CM

## 2014-02-03 DIAGNOSIS — R079 Chest pain, unspecified: Secondary | ICD-10-CM

## 2014-02-03 LAB — CBC WITH DIFFERENTIAL/PLATELET
Basophil #: 0 10*3/uL (ref 0.0–0.1)
Basophil %: 0.2 %
EOS ABS: 0 10*3/uL (ref 0.0–0.7)
Eosinophil %: 0 %
HCT: 35 % (ref 35.0–47.0)
HGB: 11.7 g/dL — ABNORMAL LOW (ref 12.0–16.0)
LYMPHS PCT: 4.3 %
Lymphocyte #: 0.4 10*3/uL — ABNORMAL LOW (ref 1.0–3.6)
MCH: 30.9 pg (ref 26.0–34.0)
MCHC: 33.4 g/dL (ref 32.0–36.0)
MCV: 93 fL (ref 80–100)
MONO ABS: 0.6 x10 3/mm (ref 0.2–0.9)
Monocyte %: 5.8 %
NEUTROS ABS: 9.3 10*3/uL — AB (ref 1.4–6.5)
Neutrophil %: 89.7 %
PLATELETS: 163 10*3/uL (ref 150–440)
RBC: 3.78 10*6/uL — ABNORMAL LOW (ref 3.80–5.20)
RDW: 13.7 % (ref 11.5–14.5)
WBC: 10.3 10*3/uL (ref 3.6–11.0)

## 2014-02-03 LAB — COMPREHENSIVE METABOLIC PANEL
ANION GAP: 9 (ref 7–16)
Albumin: 2.2 g/dL — ABNORMAL LOW (ref 3.4–5.0)
Alkaline Phosphatase: 98 U/L
BUN: 23 mg/dL — ABNORMAL HIGH (ref 7–18)
Bilirubin,Total: 0.4 mg/dL (ref 0.2–1.0)
CALCIUM: 7.2 mg/dL — AB (ref 8.5–10.1)
CHLORIDE: 96 mmol/L — AB (ref 98–107)
Co2: 29 mmol/L (ref 21–32)
Creatinine: 1.12 mg/dL (ref 0.60–1.30)
EGFR (Non-African Amer.): 48 — ABNORMAL LOW
GFR CALC AF AMER: 59 — AB
GLUCOSE: 167 mg/dL — AB (ref 65–99)
Osmolality: 276 (ref 275–301)
POTASSIUM: 3.7 mmol/L (ref 3.5–5.1)
SGOT(AST): 37 U/L (ref 15–37)
SGPT (ALT): 30 U/L
Sodium: 134 mmol/L — ABNORMAL LOW (ref 136–145)
Total Protein: 5.8 g/dL — ABNORMAL LOW (ref 6.4–8.2)

## 2014-02-03 LAB — PRO B NATRIURETIC PEPTIDE: B-TYPE NATIURETIC PEPTID: 15058 pg/mL — AB (ref 0–450)

## 2014-02-03 LAB — BASIC METABOLIC PANEL
ANION GAP: 11 (ref 7–16)
BUN: 19 mg/dL — ABNORMAL HIGH (ref 7–18)
CALCIUM: 7.8 mg/dL — AB (ref 8.5–10.1)
CHLORIDE: 94 mmol/L — AB (ref 98–107)
CREATININE: 1.02 mg/dL (ref 0.60–1.30)
Co2: 29 mmol/L (ref 21–32)
EGFR (African American): >60
EGFR (Non-African Amer.): 54 — ABNORMAL LOW
GLUCOSE: 114 mg/dL — AB (ref 65–99)
OSMOLALITY: 271 (ref 275–301)
Potassium: 3 mmol/L — ABNORMAL LOW (ref 3.5–5.1)
Sodium: 134 mmol/L — ABNORMAL LOW (ref 136–145)

## 2014-02-04 LAB — BASIC METABOLIC PANEL
ANION GAP: 7 (ref 7–16)
BUN: 27 mg/dL — ABNORMAL HIGH (ref 7–18)
CALCIUM: 7.5 mg/dL — AB (ref 8.5–10.1)
CO2: 31 mmol/L (ref 21–32)
Chloride: 95 mmol/L — ABNORMAL LOW (ref 98–107)
Creatinine: 1.15 mg/dL (ref 0.60–1.30)
EGFR (African American): 57 — ABNORMAL LOW
GFR CALC NON AF AMER: 47 — AB
GLUCOSE: 89 mg/dL (ref 65–99)
Osmolality: 271 (ref 275–301)
Potassium: 3.5 mmol/L (ref 3.5–5.1)
Sodium: 133 mmol/L — ABNORMAL LOW (ref 136–145)

## 2014-02-05 ENCOUNTER — Ambulatory Visit: Payer: Self-pay | Admitting: Internal Medicine

## 2014-02-05 LAB — URINE CULTURE

## 2014-02-06 LAB — BASIC METABOLIC PANEL
Anion Gap: 9 (ref 7–16)
BUN: 11 mg/dL (ref 7–18)
CHLORIDE: 91 mmol/L — AB (ref 98–107)
CO2: 32 mmol/L (ref 21–32)
Calcium, Total: 7.6 mg/dL — ABNORMAL LOW (ref 8.5–10.1)
Creatinine: 0.75 mg/dL (ref 0.60–1.30)
Glucose: 180 mg/dL — ABNORMAL HIGH (ref 65–99)
OSMOLALITY: 268 (ref 275–301)
POTASSIUM: 2.3 mmol/L — AB (ref 3.5–5.1)
Sodium: 132 mmol/L — ABNORMAL LOW (ref 136–145)

## 2014-02-06 LAB — PHOSPHORUS: Phosphorus: 1.1 mg/dL — ABNORMAL LOW (ref 2.5–4.9)

## 2014-02-06 LAB — MAGNESIUM: Magnesium: 1.2 mg/dL — ABNORMAL LOW

## 2014-02-07 LAB — BASIC METABOLIC PANEL
Anion Gap: 10 (ref 7–16)
BUN: 19 mg/dL — ABNORMAL HIGH (ref 7–18)
CALCIUM: 7.8 mg/dL — AB (ref 8.5–10.1)
Chloride: 90 mmol/L — ABNORMAL LOW (ref 98–107)
Co2: 31 mmol/L (ref 21–32)
Creatinine: 0.78 mg/dL (ref 0.60–1.30)
Glucose: 132 mg/dL — ABNORMAL HIGH (ref 65–99)
OSMOLALITY: 267 (ref 275–301)
Potassium: 3.4 mmol/L — ABNORMAL LOW (ref 3.5–5.1)
Sodium: 131 mmol/L — ABNORMAL LOW (ref 136–145)

## 2014-02-09 LAB — BASIC METABOLIC PANEL
Anion Gap: 9 (ref 7–16)
BUN: 24 mg/dL — AB (ref 7–18)
CHLORIDE: 92 mmol/L — AB (ref 98–107)
CREATININE: 0.89 mg/dL (ref 0.60–1.30)
Calcium, Total: 7.8 mg/dL — ABNORMAL LOW (ref 8.5–10.1)
Co2: 29 mmol/L (ref 21–32)
GLUCOSE: 107 mg/dL — AB (ref 65–99)
OSMOLALITY: 265 (ref 275–301)
Potassium: 3.6 mmol/L (ref 3.5–5.1)
Sodium: 130 mmol/L — ABNORMAL LOW (ref 136–145)

## 2014-02-09 LAB — MAGNESIUM: Magnesium: 1.4 mg/dL — ABNORMAL LOW

## 2014-02-10 ENCOUNTER — Telehealth: Payer: Self-pay | Admitting: Internal Medicine

## 2014-02-10 LAB — HEMOGLOBIN: HGB: 11.5 g/dL — ABNORMAL LOW (ref 12.0–16.0)

## 2014-02-10 LAB — SODIUM: SODIUM: 134 mmol/L — AB (ref 136–145)

## 2014-02-10 NOTE — Telephone Encounter (Signed)
Records placed in green folder

## 2014-02-10 NOTE — Telephone Encounter (Signed)
Joaquim Lai from [facility] called to schedule a hospital follow-up appt for Lori Henson. I didn't see anything available on Dr. Bary Leriche schedule within the next two weeks. Her diagnosis was abdominal pain. She was discharged today. Please call the patient with an appt. Pt ph# [phone] Thank you  P.S. If you need to call Joaquim Lai, you can reach her at [phone]

## 2014-02-10 NOTE — Telephone Encounter (Signed)
Records requested

## 2014-02-11 NOTE — Telephone Encounter (Signed)
I can see her at 12:45 on Wednesday (02/15/14) (33min).

## 2014-02-13 ENCOUNTER — Telehealth: Payer: Self-pay | Admitting: Internal Medicine

## 2014-02-13 NOTE — Telephone Encounter (Signed)
Lori Henson husband came in with her hospital discharge papers in addition to notations and pictures of her Upper GI Endoscopy. I've placed the paperwork in Dr. Bary Leriche box. The pt is currently scheduled on 03/09/14. Please let her know if she needs to come in sooner for her hospital follow-up appt. She was discharged on Nov. 6th. Thank you.

## 2014-02-13 NOTE — Telephone Encounter (Signed)
See other telephone note, pt notified and  verbalized understanding of appt 02/15/14 at 12:45

## 2014-02-13 NOTE — Telephone Encounter (Signed)
Pt notified and verbalized understanding.

## 2014-02-14 ENCOUNTER — Telehealth: Payer: Self-pay | Admitting: *Deleted

## 2014-02-14 NOTE — Telephone Encounter (Signed)
Discharge date: 02/10/14  Transition Care Management Follow-up Telephone Call  How have you been since you were released from the hospital? Pt states "much better"    Do you understand why you were in the hospital? YES   Do you understand the discharge instrcutions? YES  Items Reviewed:  Medications reviewed: YES   Allergies reviewed: N/A  Dietary changes reviewed: YES  Referrals reviewed: N/A   Functional Questionnaire:   Activities of Daily Living (ADLs):   She states they are independent in the following:  States they require assistance with the following:    Any transportation issues/concerns?: NO   Any patient concerns? NO   Confirmed importance and date/time of follow-up visits scheduled: YES, Nov 11 @ 12:45   Confirmed with patient if condition begins to worsen call PCP or go to the ER.  Patient was given the Call-a-Nurse line [phone]: YES

## 2014-02-15 ENCOUNTER — Ambulatory Visit (INDEPENDENT_AMBULATORY_CARE_PROVIDER_SITE_OTHER): Payer: Medicare Other | Admitting: Internal Medicine

## 2014-02-15 ENCOUNTER — Encounter: Payer: Self-pay | Admitting: Internal Medicine

## 2014-02-15 VITALS — BP 163/87 | HR 77 | Temp 97.8°F | Ht 66.0 in | Wt 125.2 lb

## 2014-02-15 DIAGNOSIS — C439 Malignant melanoma of skin, unspecified: Secondary | ICD-10-CM

## 2014-02-15 DIAGNOSIS — I10 Essential (primary) hypertension: Secondary | ICD-10-CM

## 2014-02-15 DIAGNOSIS — E876 Hypokalemia: Secondary | ICD-10-CM

## 2014-02-15 DIAGNOSIS — K5909 Other constipation: Secondary | ICD-10-CM

## 2014-02-15 DIAGNOSIS — R1084 Generalized abdominal pain: Secondary | ICD-10-CM

## 2014-02-15 DIAGNOSIS — E871 Hypo-osmolality and hyponatremia: Secondary | ICD-10-CM

## 2014-02-15 DIAGNOSIS — S91001D Unspecified open wound, right ankle, subsequent encounter: Secondary | ICD-10-CM

## 2014-02-15 LAB — BASIC METABOLIC PANEL
BUN: 20 mg/dL (ref 6–23)
CALCIUM: 8.4 mg/dL (ref 8.4–10.5)
CO2: 30 mEq/L (ref 19–32)
CREATININE: 0.7 mg/dL (ref 0.4–1.2)
Chloride: 98 mEq/L (ref 96–112)
GFR: 78 mL/min (ref >60.00)
Glucose, Bld: 85 mg/dL (ref 70–99)
Potassium: 5 mEq/L (ref 3.5–5.1)
SODIUM: 132 meq/L — AB (ref 135–145)

## 2014-02-15 LAB — MAGNESIUM: MAGNESIUM: 2.2 mg/dL (ref 1.5–2.5)

## 2014-02-15 NOTE — Progress Notes (Signed)
Pre visit review using our clinic review tool, if applicable. No additional management support is needed unless otherwise documented below in the visit note. 

## 2014-02-20 ENCOUNTER — Encounter: Payer: Self-pay | Admitting: Internal Medicine

## 2014-02-20 DIAGNOSIS — R109 Unspecified abdominal pain: Secondary | ICD-10-CM | POA: Insufficient documentation

## 2014-02-20 DIAGNOSIS — S81809A Unspecified open wound, unspecified lower leg, initial encounter: Secondary | ICD-10-CM

## 2014-02-20 DIAGNOSIS — S91009A Unspecified open wound, unspecified ankle, initial encounter: Secondary | ICD-10-CM

## 2014-02-20 DIAGNOSIS — S81009A Unspecified open wound, unspecified knee, initial encounter: Secondary | ICD-10-CM | POA: Insufficient documentation

## 2014-02-20 NOTE — Progress Notes (Signed)
Subjective:    Patient ID: Lori Henson, female    DOB: 1919-04-24, 80 y.o.   MRN: [id number]  HPI 80 year old female with past history of lichen planus, hypertension, osteoporosis, malignant melanoma (followed at the cancer center) and bradycardia (s/p pacemaker placement). She comes in today for hospital follow up.  She was recently admitted with abdominal pain, nausea and vomiting.  CT revealed no evidence of bowel obstruction.  Found to have persistent narrowing of the gastric antrum and patchy opacities in the lung bases.  She was treated for pneumonia.  Had EGD - revealed schatzki ring and gastritis. Is s/p dilatation.  Feels better.  Eating and drinking well.  No nausea or vomiting.  Had bm this am.    She does report that she had a squamous cell skin lesion removed from her right ankle.  Lesion not healing.  Seeing Dr Kellie Moor.  Present now for months.      Past Medical History  Diagnosis Date  . Malignant melanoma 2001    right leg, s/p radiation, recurrence 2008  . Hypertension   . 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 Lichen planus   . Angular cheilitis   . Osteoporosis     fosamax, compression fx s/p kyphoplasty    Outpatient Encounter Prescriptions as of 02/15/2014  Medication Sig  . calcium carbonate (TUMS EX) 750 MG chewable tablet Chew 1 tablet by mouth 2 (two) times daily.  Lori Henson Sodium 30-100 MG CAPS Take by mouth daily.   . Cholecalciferol ([employer]S VITAMIN D3) 1000 UNITS capsule Take 1,000 Units by mouth daily.  . clonazePAM (KLONOPIN) 0.5 MG tablet Take 0.5 mg by mouth 2 (two) times daily as needed. Takes 1 1/2 tablet qhs  . fentaNYL (DURAGESIC - DOSED MCG/HR) 50 MCG/HR Place 37.5 mcg onto the skin every 3 (three) days.   Marland Kitchen HYDROcodone-acetaminophen (NORCO) 7.5-325 MG per tablet Take 1 tablet by mouth every 6 (six) hours as needed for moderate pain.  Marland Kitchen L-Methylfolate (DEPLIN) 15 MG TABS Take by mouth.  Marland Kitchen lisinopril (PRINIVIL,ZESTRIL) 10 MG tablet Take  10 mg by mouth 2 (two) times daily.  . metoprolol (LOPRESSOR) 50 MG tablet Take 50 mg by mouth daily.  . mirtazapine (REMERON) 15 MG tablet Take 30 mg by mouth.   . Multiple Vitamins-Minerals (ICAPS) CAPS Take by mouth 2 (two) times daily.   . pantoprazole (PROTONIX) 40 MG tablet   . PARoxetine (PAXIL) 10 MG tablet   . Sorbitol SOLN by Does not apply route as needed.  . [DISCONTINUED] hydrochlorothiazide (HYDRODIURIL) 25 MG tablet Take 25 mg by mouth daily.    Review of Systems Patient denies any headache, lightheadedness or dizziness.  No significant sinus or allergy symptoms.  No chest pain, tightness or palpitations.  No increased shortness of breath, cough or congestion.  Appetite has improved.  No nausea or vomiting now.   No acid reflux.  Has chronic constipation - better on Lactulose and prunes.  Had a bowel movement today.  No BRBPR or melana.  No urine change.  No abdominal pain now.  Persistent ankle lesion.          Objective:   Physical Exam  Filed Vitals:   02/15/14 1245  BP: 163/87  Pulse: 77  Temp: 97.8 F (90.75 C)   80 year old female in no acute distress.   HEENT:  Oropharynx - without lesions.  Nares clear.  NECK:  Supple.  Nontender.  No audible bruit.  HEART:  Appears to be regular. LUNGS:  No crackles or wheezing audible.  Respirations even and unlabored.  RADIAL PULSE:  Equal bilaterally.  ABDOMEN:  Soft, nontender.  Bowel sounds present and normal.  No audible abdominal bruit.    EXTREMITIES:  No increased edema.  Stable ankle edema.  No increased erythema.    Small circular open wound (approximately one cm) right medial ankle.           Assessment & Plan:  MSK.  On Fentanyl.  Seeing Dr Sharlet Salina.   PULMONARY.  Breathing stable.  Follow.   Hypokalemia Found to have low potassium in the hospital.  - Basic metabolic panel - Magnesium  Essential hypertension Blood pressure has been controlled.  Elevated today.  Spot check pressures.  Follow.  Check  metabolic panel.   Chronic constipation Using lactulose.  Had bm today.  Follow.   Malignant melanoma Seeing dermatology.   Hyponatremia Recheck sodium.   Generalized abdominal pain Abdominal pain resolved.  Had EGD as outlined.  Continue protonix.    Wound of ankle, right, subsequent encounter Persistent.  Seeing dermatology.    HEALTH MAINTENANCE.  Physical 06/03/12.  Colonoscopy 02/06/04 normal.  Mammogram 08/23/12 - Birads II.  Overdue.  Will need to arrange.    DISPOSITION.  Living at the [facility] now.      I spent 25 minutes with the patient and her husband and more than 50% of the time was spent in consultation regarding the above.

## 2014-03-07 ENCOUNTER — Ambulatory Visit: Payer: Self-pay | Admitting: Internal Medicine

## 2014-03-09 ENCOUNTER — Ambulatory Visit (INDEPENDENT_AMBULATORY_CARE_PROVIDER_SITE_OTHER): Payer: Medicare Other | Admitting: Internal Medicine

## 2014-03-09 ENCOUNTER — Encounter: Payer: Self-pay | Admitting: Internal Medicine

## 2014-03-09 VITALS — Ht 66.0 in

## 2014-03-09 DIAGNOSIS — R600 Localized edema: Secondary | ICD-10-CM

## 2014-03-09 DIAGNOSIS — I10 Essential (primary) hypertension: Secondary | ICD-10-CM

## 2014-03-09 DIAGNOSIS — E871 Hypo-osmolality and hyponatremia: Secondary | ICD-10-CM

## 2014-03-09 DIAGNOSIS — C439 Malignant melanoma of skin, unspecified: Secondary | ICD-10-CM

## 2014-03-09 DIAGNOSIS — K5909 Other constipation: Secondary | ICD-10-CM

## 2014-03-09 DIAGNOSIS — F32A Depression, unspecified: Secondary | ICD-10-CM

## 2014-03-09 DIAGNOSIS — K5900 Constipation, unspecified: Secondary | ICD-10-CM

## 2014-03-09 DIAGNOSIS — F329 Major depressive disorder, single episode, unspecified: Secondary | ICD-10-CM

## 2014-03-09 MED ORDER — LISINOPRIL 10 MG PO TABS: 10.0000 mg | ORAL_TABLET | Freq: Two times a day (BID) | ORAL | Status: DC

## 2014-03-09 MED ORDER — PANTOPRAZOLE SODIUM 40 MG PO TBEC: 40.0000 mg | DELAYED_RELEASE_TABLET | Freq: Two times a day (BID) | ORAL | Status: DC

## 2014-03-09 MED ORDER — TRIAMCINOLONE ACETONIDE 0.1 % EX CREA: 1.0000 "application " | TOPICAL_CREAM | Freq: Two times a day (BID) | CUTANEOUS | Status: DC

## 2014-03-09 NOTE — Progress Notes (Signed)
Pre visit review using our clinic review tool, if applicable. No additional management support is needed unless otherwise documented below in the visit note. 

## 2014-03-11 DIAGNOSIS — I10 Essential (primary) hypertension: Secondary | ICD-10-CM

## 2014-03-11 DIAGNOSIS — C439 Malignant melanoma of skin, unspecified: Secondary | ICD-10-CM

## 2014-03-11 DIAGNOSIS — R1084 Generalized abdominal pain: Secondary | ICD-10-CM

## 2014-03-11 DIAGNOSIS — E871 Hypo-osmolality and hyponatremia: Secondary | ICD-10-CM

## 2014-03-11 DIAGNOSIS — K5900 Constipation, unspecified: Secondary | ICD-10-CM

## 2014-03-12 ENCOUNTER — Encounter: Payer: Self-pay | Admitting: Internal Medicine

## 2014-03-12 NOTE — Progress Notes (Signed)
Subjective:    Patient ID: Lori Henson, female    DOB: Sep 23, 1919, 80 y.o.   MRN: [id number]  HPI 80 year old female with past history of lichen planus, hypertension, osteoporosis, malignant melanoma (followed at the cancer center) and bradycardia (s/p pacemaker placement). She comes in today for a scheduled follow up.   She was recently admitted with abdominal pain, nausea and vomiting.  CT revealed no evidence of bowel obstruction.  Found to have persistent narrowing of the gastric antrum and patchy opacities in the lung bases.  She was treated for pneumonia.  Had EGD - revealed schatzki ring and gastritis. Is s/p dilatation.  On protonix.  Feels better.  Eating and drinking well.  No nausea or vomiting.  Had bm this am.   She is now riding a bike.  Overall feels better.  Has a persistent rash - left upper shoulder.  Itches.    Past Medical History  Diagnosis Date  . Malignant melanoma 2001    right leg, s/p radiation, recurrence 2008  . Hypertension   . 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 Lichen planus   . Angular cheilitis   . Osteoporosis     fosamax, compression fx s/p kyphoplasty    Outpatient Encounter Prescriptions as of 03/09/2014  Medication Sig  . calcium carbonate (TUMS EX) 750 MG chewable tablet Chew 1 tablet by mouth 2 (two) times daily.  Sarajane Marek Sodium 30-100 MG CAPS Take by mouth daily.   . Cholecalciferol (CVS VITAMIN D3) 1000 UNITS capsule Take 1,000 Units by mouth daily.  . clonazePAM (KLONOPIN) 0.5 MG tablet Take 0.5 mg by mouth 2 (two) times daily as needed. Takes 1 1/2 tablet qhs  . fentaNYL (DURAGESIC - DOSED MCG/HR) 50 MCG/HR Place 37.5 mcg onto the skin every 3 (three) days.   Marland Kitchen HYDROcodone-acetaminophen (NORCO) 7.5-325 MG per tablet Take 1 tablet by mouth every 6 (six) hours as needed for moderate pain.  Marland Kitchen L-Methylfolate (DEPLIN) 15 MG TABS Take by mouth.  Marland Kitchen lisinopril (PRINIVIL,ZESTRIL) 10 MG tablet Take 1 tablet (10 mg total) by mouth 2 (two)  times daily.  . metoprolol succinate (TOPROL-XL) 50 MG 24 hr tablet Take 50 mg by mouth daily.  . mirtazapine (REMERON) 15 MG tablet Take 30 mg by mouth.   . Multiple Vitamins-Minerals (ICAPS) CAPS Take by mouth 2 (two) times daily.   . pantoprazole (PROTONIX) 40 MG tablet Take 1 tablet (40 mg total) by mouth 2 (two) times daily.  Marland Kitchen PARoxetine (PAXIL) 10 MG tablet   . Sorbitol SOLN by Does not apply route as needed.  . [DISCONTINUED] lisinopril (PRINIVIL,ZESTRIL) 10 MG tablet Take 10 mg by mouth 2 (two) times daily.  . [DISCONTINUED] metoprolol (LOPRESSOR) 50 MG tablet Take 50 mg by mouth daily.  . [DISCONTINUED] pantoprazole (PROTONIX) 40 MG tablet 2 (two) times daily.   Marland Kitchen triamcinolone cream (KENALOG) 0.1 % Apply 1 application topically 2 (two) times daily.    Review of Systems Patient denies any headache, lightheadedness or dizziness.  No significant sinus or allergy symptoms.  No chest pain, tightness or palpitations.  No increased shortness of breath, cough or congestion.  Appetite has improved.  No nausea or vomiting now.   No acid reflux.  Has chronic constipation - better on Lactulose and prunes.  Had a bowel movement today.  No BRBPR or melana.  No urine change.  Feels better.  More energy.  Persistent rash.  Objective:   Physical Exam weight 120.4 pounds HT - 5'6" Temp- 98 Pulse 81 O2 sats - 94 Blood pressure 14/86  80 year old female in no acute distress.   HEENT:  Oropharynx - without lesions.  Nares clear.  NECK:  Supple.  Nontender.  No audible bruit.  HEART:  Appears to be regular. LUNGS:  No crackles or wheezing audible.  Respirations even and unlabored.  RADIAL PULSE:  Equal bilaterally.  BREASTS:  No nipple discharge or nipple retraction present.  Could not appreciate any distinct nodules or axillary adenopathy. ABDOMEN:  Soft, nontender.  Bowel sounds present and normal.  No audible abdominal bruit.    EXTREMITIES:  No increased edema.  Stable ankle  edema SKIN:  Erythematous based patch - rash.  Appears to be c/w contact dermatitis.            Assessment & Plan:  MSK.  On Fentanyl.  Seeing Dr Sharlet Salina. Stable.   PULMONARY.  Breathing stable.  Follow.   Essential hypertension Blood pressure doing well.   Chronic constipation Stable.    Malignant melanoma Is followed by Dr Ma Hillock.  See his last note for details.  Stable.   Hyponatremia Follow sodium.   Edema of lower extremity, unspecified laterality Leg elevation and compression hose.   Depression Doing better.   RASH:  TCC cream as directed.    HEALTH MAINTENANCE.  Physical today.  Colonoscopy 02/06/04 normal.  Mammogram 08/23/12 - Birads II.  Desires no further mammogram.     DISPOSITION.  Living at the [facility] now.      I spent 25 minutes with the patient and her husband and more than 50% of the time was spent in consultation regarding the above.

## 2014-03-21 ENCOUNTER — Encounter: Payer: Self-pay | Admitting: Internal Medicine

## 2014-03-24 DIAGNOSIS — IMO0001 Reserved for inherently not codable concepts without codable children: Secondary | ICD-10-CM | POA: Insufficient documentation

## 2014-04-09 ENCOUNTER — Encounter: Payer: Self-pay | Admitting: Internal Medicine

## 2014-04-09 DIAGNOSIS — R1310 Dysphagia, unspecified: Secondary | ICD-10-CM | POA: Insufficient documentation

## 2014-04-21 ENCOUNTER — Telehealth: Payer: Self-pay | Admitting: Internal Medicine

## 2014-04-21 NOTE — Telephone Encounter (Signed)
Please call and check up on pt and confirm went to urgent care.  Thanks.

## 2014-04-21 NOTE — Telephone Encounter (Signed)
Left message for pt to return my call.

## 2014-04-21 NOTE — Telephone Encounter (Signed)
[facility] Patient Name: Lori Henson DOB: 03-01-1920 Nurse Assessment Nurse: Rock Nephew, RN, Juliann Pulse Date/Time (Eastern Time): 04/21/2014 9:38:02 AM Confirm and document reason for call. If symptomatic, describe symptoms. ---Caller states she didn't sleep last night because she has restless legs and frequent and painful urination. Has the patient traveled out of the country within the last 30 days? ---Not Applicable Does the patient require triage? ---Yes Related visit to physician within the last 2 weeks? ---No Does the PT have any chronic conditions? (i.e. diabetes, asthma, etc.) ---Yes List chronic conditions. ---HTN Guidelines Guideline Title Affirmed Question Affirmed Notes Urination Pain - Female Side (flank) or lower back pain present lower back Final Disposition User See Physician within 4 Hours (or PCP triage) Rock Nephew, RN, Juliann Pulse Comments Since no appointments available here I did offer appointments at [facility] but called declined, she states she would rather just go to a nearby urgent care clinic, she does not feel well enough to go to [place]

## 2014-04-25 NOTE — Telephone Encounter (Signed)
Left message for pt to return my call.

## 2014-04-25 NOTE — Telephone Encounter (Signed)
Noted.  Sounds like improved.  Let me know if she felt needed to be seen.

## 2014-04-25 NOTE — Telephone Encounter (Signed)
Spoke to patient, was seen in [facility] [place] walkin last week for UTI, Rx'd Cipro, Pyridium, and Baclofen. Stopped the Pyridium and Baclofen due to side effects of confusion. Husband states the symptoms have improved since stopping those, pt states she is still having difficulty recalling information. Denies any dysuria, hematuria, frequency. No urinary symptoms currently, denies any other symptoms. Continues with Cipro.

## 2014-05-05 IMAGING — PT NM PET TUM IMG RESTAG (PS) SKULL BASE T - THIGH
1 of 6 series · 1 of 25 positions shown · non-contrast
Comparison: none

REASON FOR EXAM: Weight loss Restaging melanoma
COMMENTS:

PROCEDURE:     PET - PET/CT MELANOMA RESTG WB  - December 30, 2011  [DATE]
RESULT:     Indication: Restaging melanoma
Radiopharmaceutical: 12.93 mCi F18-FDG, intravenously.
TECHNIQUE: Imaging was performed from the skull base to the mid-thigh using
routine PET/CT acquisition protocol.
Injection site: Right antecubital
Time of FDG injection: 0510 hours
Serum glucose: 92 mg/dL
Time of imaging: 8681 hours with delayed images performed at 1055 hours
Comparison studies: NONE

[Series 102: pet legs · axial · 5.0mm · 4.07mm/px · 1 of 290 slices shown]
[im 145/290]
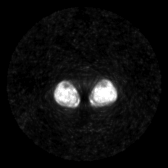

[1 of 25 positions shown; findings below may reference images not displayed]

FINDINGS: HEAD AND NECK:

There is no abnormal hypermetabolic activity in the head and neck. There is
no cervical soft tissue mass or lymphadenopathy.

CHEST:

There is no abnormal hypermetabolic activity in the chest.

The lungs are clear. There is no focal parenchymal opacity, pleural
effusion, or pneumothorax.

The heart size is normal. There is no pericardial effusion. There is a
dual-lead cardiac pacer noted.

There no pathologically enlarged mediastinal, hilar, or axillary lymph
nodes.

The osseous structures demonstrate no focal abnormality.

ABDOMEN/PELVIS:

The liver demonstrates no focal abnormality. The gallbladder is
unremarkable. The spleen demonstrates no focal abnormality. There is a large
right renal cyst. The left kidney, adrenal glands, pancreas are normal. The
bladder is unremarkable.

The unopacified bowel is unremarkable. There is no pneumoperitoneum,
pneumatosis, or portal venous gas. There is no abdominal or pelvic free
fluid. There is no lymphadenopathy.

The abdominal aorta is normal in caliber.

There is lumbar spine spondylosis.

EXTREMITIES:

There is hypermetabolic activity along the medial aspect of the right ankle
with skin thickening with an SUV max of 1.2 and an SUV average of 0.9 .
IMPRESSION: 1. There is hypermetabolic activity along the medial aspect of the right
ankle with skin thickening. This may represent posttreatment changes, but
recurrent malignancy cannot be excluded.

[REDACTED]

## 2014-05-10 DIAGNOSIS — M47817 Spondylosis without myelopathy or radiculopathy, lumbosacral region: Secondary | ICD-10-CM | POA: Insufficient documentation

## 2014-05-10 DIAGNOSIS — M4716 Other spondylosis with myelopathy, lumbar region: Secondary | ICD-10-CM | POA: Insufficient documentation

## 2014-06-08 ENCOUNTER — Encounter: Payer: Self-pay | Admitting: Internal Medicine

## 2014-06-08 ENCOUNTER — Ambulatory Visit (INDEPENDENT_AMBULATORY_CARE_PROVIDER_SITE_OTHER): Payer: Medicare Other | Admitting: Internal Medicine

## 2014-06-08 VITALS — BP 120/86 | HR 86 | Temp 97.5°F | Ht 66.0 in | Wt 119.0 lb

## 2014-06-08 DIAGNOSIS — R600 Localized edema: Secondary | ICD-10-CM

## 2014-06-08 DIAGNOSIS — K5900 Constipation, unspecified: Secondary | ICD-10-CM | POA: Diagnosis not present

## 2014-06-08 DIAGNOSIS — F32A Depression, unspecified: Secondary | ICD-10-CM

## 2014-06-08 DIAGNOSIS — L659 Nonscarring hair loss, unspecified: Secondary | ICD-10-CM | POA: Diagnosis not present

## 2014-06-08 DIAGNOSIS — K2970 Gastritis, unspecified, without bleeding: Secondary | ICD-10-CM

## 2014-06-08 DIAGNOSIS — F329 Major depressive disorder, single episode, unspecified: Secondary | ICD-10-CM

## 2014-06-08 DIAGNOSIS — C439 Malignant melanoma of skin, unspecified: Secondary | ICD-10-CM

## 2014-06-08 DIAGNOSIS — K5909 Other constipation: Secondary | ICD-10-CM

## 2014-06-08 DIAGNOSIS — I10 Essential (primary) hypertension: Secondary | ICD-10-CM | POA: Diagnosis not present

## 2014-06-08 MED ORDER — LISINOPRIL 10 MG PO TABS: 10.0000 mg | ORAL_TABLET | Freq: Two times a day (BID) | ORAL | Status: DC

## 2014-06-08 NOTE — Patient Instructions (Signed)
Stop protonix.  Start zantac (ranitidine) 150mg  - one per day.

## 2014-06-08 NOTE — Progress Notes (Signed)
Pre visit review using our clinic review tool, if applicable. No additional management support is needed unless otherwise documented below in the visit note. 

## 2014-06-09 LAB — COMPREHENSIVE METABOLIC PANEL
ALT: 15 U/L (ref 0–35)
AST: 31 U/L (ref 0–37)
Albumin: 3.8 g/dL (ref 3.5–5.2)
Alkaline Phosphatase: 84 U/L (ref 39–117)
BUN: 22 mg/dL (ref 6–23)
CALCIUM: 9.1 mg/dL (ref 8.4–10.5)
CHLORIDE: 101 meq/L (ref 96–112)
CO2: 29 meq/L (ref 19–32)
CREATININE: 0.99 mg/dL (ref 0.40–1.20)
GFR: 55.71 mL/min — AB (ref >60.00)
Glucose, Bld: 89 mg/dL (ref 70–99)
Potassium: 4.5 mEq/L (ref 3.5–5.1)
Sodium: 136 mEq/L (ref 135–145)
Total Bilirubin: 0.3 mg/dL (ref 0.2–1.2)
Total Protein: 7.2 g/dL (ref 6.0–8.3)

## 2014-06-09 LAB — CBC WITH DIFFERENTIAL/PLATELET
BASOS ABS: 0 10*3/uL (ref 0.0–0.1)
Basophils Relative: 0.3 % (ref 0.0–3.0)
EOS ABS: 0.2 10*3/uL (ref 0.0–0.7)
Eosinophils Relative: 4.3 % (ref 0.0–5.0)
HEMATOCRIT: 37.2 % (ref 36.0–46.0)
Hemoglobin: 12.6 g/dL (ref 12.0–15.0)
Lymphocytes Relative: 17.8 % (ref 12.0–46.0)
Lymphs Abs: 0.9 10*3/uL (ref 0.7–4.0)
MCHC: 33.8 g/dL (ref 30.0–36.0)
MCV: 85.9 fl (ref 78.0–100.0)
Monocytes Absolute: 0.5 10*3/uL (ref 0.1–1.0)
Monocytes Relative: 9.3 % (ref 3.0–12.0)
NEUTROS ABS: 3.3 10*3/uL (ref 1.4–7.7)
Neutrophils Relative %: 68.3 % (ref 43.0–77.0)
PLATELETS: 205 10*3/uL (ref 150.0–400.0)
RBC: 4.33 Mil/uL (ref 3.87–5.11)
RDW: 15.3 % (ref 11.5–15.5)
WBC: 4.9 10*3/uL (ref 4.0–10.5)

## 2014-06-09 LAB — TSH: TSH: 1.67 u[IU]/mL (ref 0.35–4.50)

## 2014-06-11 ENCOUNTER — Encounter: Payer: Self-pay | Admitting: Internal Medicine

## 2014-06-12 ENCOUNTER — Encounter: Payer: Self-pay | Admitting: Internal Medicine

## 2014-06-12 DIAGNOSIS — K2970 Gastritis, unspecified, without bleeding: Secondary | ICD-10-CM | POA: Insufficient documentation

## 2014-06-12 DIAGNOSIS — L659 Nonscarring hair loss, unspecified: Secondary | ICD-10-CM | POA: Insufficient documentation

## 2014-06-12 NOTE — Assessment & Plan Note (Signed)
Taking lactulose.  Discussed diet changes.  Follow.

## 2014-06-12 NOTE — Assessment & Plan Note (Signed)
Check routine labs including cbc and tsh.

## 2014-06-12 NOTE — Assessment & Plan Note (Signed)
Stable.  Follow.  Overall doing better.

## 2014-06-12 NOTE — Assessment & Plan Note (Signed)
Found recently on EGd.  Pt has no symptoms and has concerns regarding continuing on PPI.  Will change to zantac and see if symptoms controlled.  Follow.

## 2014-06-12 NOTE — Assessment & Plan Note (Signed)
Improved.  Continue compression hose.  

## 2014-06-12 NOTE — Progress Notes (Signed)
Patient ID: CASH MEADOW, female   DOB: 09/18/1919, 81 y.o.   MRN: [id number]   Subjective:    Patient ID: Lori Henson, female    DOB: 14-Jul-1919, 81 y.o.   MRN: [id number]  HPI  Patient here for a scheduled follow up.  Overall doing better.  Handling stress better.  Ambulating with her walker.  Able to get around her house.  Eating better.  Still some constipation.  No vomiting or nausea.  Some occasional issues with hemorrhoids. Also concern regarding hair loss.  Discussed w/up.  Will start with labs.  May need dermatology referral.     Past Medical History  Diagnosis Date  . Malignant melanoma 2001    right leg, s/p radiation, recurrence 2008  . Hypertension   . 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 Lichen planus   . Angular cheilitis   . Osteoporosis     fosamax, compression fx s/p kyphoplasty    Current Outpatient Prescriptions on File Prior to Visit  Medication Sig Dispense Refill  . calcium carbonate (TUMS EX) 750 MG chewable tablet Chew 1 tablet by mouth 2 (two) times daily.    Sarajane Marek Sodium 30-100 MG CAPS Take by mouth daily.     . Cholecalciferol ([employer] VITAMIN D3) 1000 UNITS capsule Take 1,000 Units by mouth daily.    . clonazePAM (KLONOPIN) 0.5 MG tablet Take 0.5 mg by mouth 2 (two) times daily as needed. Takes 1 1/2 tablet qhs    . fentaNYL (DURAGESIC - DOSED MCG/HR) 50 MCG/HR Place 25 mcg onto the skin every 3 (three) days.     Marland Kitchen L-Methylfolate (DEPLIN) 15 MG TABS Take by mouth.    . metoprolol succinate (TOPROL-XL) 50 MG 24 hr tablet Take 50 mg by mouth daily.  0  . mirtazapine (REMERON) 15 MG tablet Take 30 mg by mouth.     . Multiple Vitamins-Minerals (ICAPS) CAPS Take by mouth 2 (two) times daily.     . pantoprazole (PROTONIX) 40 MG tablet Take 1 tablet (40 mg total) by mouth 2 (two) times daily. 60 tablet 2  . PARoxetine (PAXIL) 10 MG tablet   0  . Sorbitol SOLN by Does not apply route as needed.     No current facility-administered medications on  file prior to visit.    Review of Systems  Constitutional: Negative for appetite change (eating well. ) and unexpected weight change.  HENT: Negative for congestion and sinus pressure.   Respiratory: Negative for cough, chest tightness and shortness of breath.   Cardiovascular: Positive for leg swelling (inproved with compression hose.  ). Negative for chest pain and palpitations.  Gastrointestinal: Negative for nausea, vomiting, abdominal pain and diarrhea.  Genitourinary: Negative for dysuria and difficulty urinating.  Musculoskeletal: Negative for joint swelling.  Skin: Negative for color change and rash.  Neurological: Negative for dizziness, light-headedness and headaches.  Psychiatric/Behavioral:       Overall doing better regarding the depression.         Objective:    Physical Exam  Constitutional: No distress.  HENT:  Nose: Nose normal.  Mouth/Throat: Oropharynx is clear and moist.  Neck: Neck supple. No thyromegaly present.  Cardiovascular: Normal rate and regular rhythm.   Pulmonary/Chest: Breath sounds normal. No respiratory distress. She has no wheezes.  Abdominal: Soft. Bowel sounds are normal. There is no tenderness.  Musculoskeletal: She exhibits edema (some minimal lower extremity edema.  ). She exhibits no tenderness.  Lymphadenopathy:    She  has no cervical adenopathy.  Skin: No rash noted. No erythema.    BP 120/86 mmHg  Pulse 86  Temp(Src) 97.5 F (36.4 C) (Oral)  Ht 5\' 6"  (1.676 m)  Wt 119 lb (53.978 kg)  BMI 19.22 kg/m2  SpO2 95% Wt Readings from Last 3 Encounters:  06/08/14 119 lb (53.978 kg)  02/15/14 125 lb 4 oz (56.813 kg)  11/10/13 121 lb (54.885 kg)     Lab Results  Component Value Date   WBC 4.9 06/08/2014   HGB 12.6 06/08/2014   HCT 37.2 06/08/2014   PLT 205.0 06/08/2014   GLUCOSE 89 06/08/2014   ALT 15 06/08/2014   AST 31 06/08/2014   NA 136 06/08/2014   K 4.5 06/08/2014   CL 101 06/08/2014   CREATININE 0.99 06/08/2014    BUN 22 06/08/2014   CO2 29 06/08/2014   TSH 1.67 06/08/2014    Patient was never admitted.     Assessment & Plan:   Problem List Items Addressed This Visit    Chronic constipation    Taking lactulose.  Discussed diet changes.  Follow.        Depression    Stable.  Follow.  Overall doing better.        Gastritis    Found recently on EGd.  Pt has no symptoms and has concerns regarding continuing on PPI.  Will change to zantac and see if symptoms controlled.  Follow.        Hair loss - Primary    Check routine labs including cbc and tsh.        Relevant Orders   CBC with Differential/Platelet (Completed)   TSH (Completed)   Hypertension    Blood pressure has been doing relatively well.  Same medication regimen.  Follow pressures.  Follow metabolic panel.        Relevant Medications   lisinopril (PRINIVIL,ZESTRIL) tablet   Other Relevant Orders   Comprehensive metabolic panel (Completed)   Lower extremity edema    Improved.  Continue compression hose.        Malignant melanoma    Sees Dr Oliva Bustard.  Also seeing Dr Kellie Moor.         I spent 25 minutes with the patient and more than 50% of the time was spent in consultation regarding the above.     Einar Pheasant, MD

## 2014-06-12 NOTE — Assessment & Plan Note (Signed)
Sees Dr Oliva Bustard.  Also seeing Dr Kellie Moor.

## 2014-06-12 NOTE — Assessment & Plan Note (Signed)
Blood pressure has been doing relatively well.  Same medication regimen.  Follow pressures.  Follow metabolic panel.

## 2014-06-14 NOTE — Telephone Encounter (Signed)
Unread mychart message mailed to patient 

## 2014-06-16 ENCOUNTER — Other Ambulatory Visit: Payer: Self-pay | Admitting: *Deleted

## 2014-06-16 ENCOUNTER — Telehealth: Payer: Self-pay | Admitting: *Deleted

## 2014-06-16 MED ORDER — SORBITOL SOLN: Status: DC

## 2014-06-16 NOTE — Telephone Encounter (Signed)
Ok to refill x 3 

## 2014-06-16 NOTE — Telephone Encounter (Signed)
Fax from pharmacy requesting Sorbitol 70% solution.  Last OV 3.3.16.  Does not appear you have ever refilled this medication.  Please advise refill.

## 2014-07-07 DIAGNOSIS — R001 Bradycardia, unspecified: Secondary | ICD-10-CM | POA: Insufficient documentation

## 2014-07-11 ENCOUNTER — Ambulatory Visit
Admit: 2014-07-11 | Disposition: A | Discharge status: Home / Self Care | Payer: Self-pay | Attending: Oncology | Admitting: Oncology

## 2014-07-11 LAB — COMPREHENSIVE METABOLIC PANEL WITH GFR
Albumin: 3.6 g/dL
Alkaline Phosphatase: 91 U/L
Anion Gap: 3 — ABNORMAL LOW
BUN: 21 mg/dL — ABNORMAL HIGH
Bilirubin,Total: 0.3 mg/dL
Calcium, Total: 8.7 mg/dL — ABNORMAL LOW
Chloride: 96 mmol/L — ABNORMAL LOW
Co2: 32 mmol/L
Creatinine: 0.65 mg/dL
EGFR (African American): >60
EGFR (Non-African Amer.): >60
Glucose: 114 mg/dL — ABNORMAL HIGH
Potassium: 4.1 mmol/L
SGOT(AST): 25 U/L
SGPT (ALT): 20 U/L
Sodium: 131 mmol/L — ABNORMAL LOW
Total Protein: 7.1 g/dL

## 2014-07-11 LAB — CBC CANCER CENTER
Basophil #: 0.1 "x10 3/mm "
Basophil %: 1.2 %
Eosinophil #: 0.4 "x10 3/mm "
Eosinophil %: 7.5 %
HCT: 38.5 %
HGB: 12.9 g/dL
Lymphocyte %: 20 %
Lymphs Abs: 1.1 "x10 3/mm "
MCH: 29 pg
MCHC: 33.4 g/dL
MCV: 87 fL
Monocyte #: 0.6 "x10 3/mm "
Monocyte %: 11.1 %
Neutrophil #: 3.3 "x10 3/mm "
Neutrophil %: 60.2 %
Platelet: 211 "x10 3/mm "
RBC: 4.44 "x10 6/mm "
RDW: 14.9 % — ABNORMAL HIGH
WBC: 5.5 "x10 3/mm "

## 2014-07-29 NOTE — Consult Note (Signed)
Pt ate 3/4 of supper plate, drinking better.  Complains of no bowel movement since admission.  Will increase sorbitol and give glycerin supp at her request.  Electronic Signatures: Manya Silvas (MD)  (Signed on 03-Nov-15 17:48)  Authored  Last Updated: 03-Nov-15 17:48 by Manya Silvas (MD)

## 2014-07-29 NOTE — Consult Note (Signed)
Pt with some congestion, some degree of CHF, questionable aspiration, anesthesiology suggests postpone test for few days.  Abd soft but mild distended.  Will hold on EGD for now and observe over next 2-3 days.   Electronic Signatures: Manya Silvas (MD)  (Signed on 02-Nov-15 08:42)  Authored  Last Updated: 02-Nov-15 08:42 by Manya Silvas (MD)

## 2014-07-29 NOTE — Consult Note (Signed)
PATIENT NAME:  Lori Henson, Lori Henson MR#:  [id number] DATE OF BIRTH:  1919-12-31  DATE OF CONSULTATION:  02/04/2014  REFERRING PHYSICIAN:     Manya Silvas, MD CONSULTING PHYSICIAN:  Dwayne D. Clayborn Bigness, MD  PRIMARY CARE PHYSICIAN: Einar Pheasant, MD  INDICATION: Permanent pacemaker, hypoxemia, and shortness of breath with bradycardia.   HISTORY OF PRESENT ILLNESS: Lori Henson is a 81 year old white female followed by Dr. Saralyn Pilar, history of melanoma, being worked up for GI reflux symptoms; had significant problems with nausea, vomiting, and emesis. The patient had a CT of the abdomen, which was basically unremarkable except lower lung cut suggested bilateral pneumonia. Chest x-ray was used to confirm that. Apparently, there was some concern whether the patient had aspiration rather than vomiting. She has not had any fevers or chills.  Denies any chest pain or palpitations. does not have abdominal discomfort. She has chronic wounds on her legs and is being followed as an outpatient. She did not have any chest pain or syncope. Denies any palpitations She has a permanent pacemaker for 6 or 7 years and was told that the battery is dying and needs to have it changed. She missed her pacemaker appointment because she came to the emergency room and cardiology is recommended for further evaluation based on that.   PAST MEDICAL HISTORY: Hypertension, UTIs, osteoarthritis, sick sinus syndrome, gallbladder disease.  PAST SURGICAL HISTORY: Permanent pacemaker placement, hernia surgery, lens implant, kyphoplasty, surgery for melanoma, cystocele, inguinal hernia repair, hemorrhoidectomy, bladder surgery.   FAMILY HISTORY: Positive in colon, ovarian, uterine cancer.   SOCIAL HISTORY: Married, retired. No smoking or alcohol consumption.   REVIEW OF SYSTEMS: Denies blackout spells or syncope. She has had nausea and vomiting. Denies fever, chills, or sweats. Denies weight loss or weight gain. Denies hemoptysis or  hematemesis. No bright red blood per rectum. She has had some abdominal discomfort. The patient has had a  history of anxiety. Denies any recent palpitations, tachycardia, or syncope.   PHYSICAL EXAMINATION:  VITAL SIGNS: Blood pressure 180/70, pulse of 60, respiratory rate of 14, afebrile.  HEENT: Normocephalic, atraumatic. Pupils equal and reactive to light.  NECK: Supple. No significant JVD, bruits, or lymphadenopathy.  LUNGS: Clear to auscultation and percussion with some crackles in the lower bases.  HEART: Regular with a systolic ejection murmur at the apex. PMI nondisplaced.  ABDOMEN: Within normal limits. EXTREMITIES: Within normal limits. NEUROLOGIC: Grossly intact. SKIN: Intact, normal.   LABORATORY DATA: Chest x-ray: Bilateral lower lobe pneumonia, right worse than left. CT of the abdomen confirms possible pneumonia. Otherwise CT of the abdomen unremarkable except for mild gastritis and compression fractures. White count 11, hemoglobin 12.7, platelet count of 207,000. Glucose 142, BUN 18, creatinine 1.12, sodium 135, potassium 3.6, chloride 93, CO2 of 30. LFTs negative. Urine: Possible UTI.    ASSESSMENT: Bilateral pneumonia, gastroenteritis, abdominal pain, history of sick sinus syndrome with dying pacemaker battery life, hypertension poorly controlled, possible urinary tract infection, arthritis.   PLAN:  1.  Agree with admit. Place on antibiotic therapy. The patient is being treated for wounds, but this appears to be pneumonia. Would recommend inhalers. Consider pulmonary input. Continue aggressive antibiotic therapy in the interim.  2.  History of sick sinus syndrome, permanent pacemaker in place with gradually dying  pacemaker function. Would recommend further evaluation with pacemaker interrogation. I do not believe it needs to be done emergently. She can wait until she is discharged in re-follow up since she canceled her appointment a few days ago.  3.  Bradycardia appears to  stable. Pacemaker will be adequate backing it up for now.  4.  Chronic leg wounds. Continue current local therapy. She is being followed at the wound center. Antibiotic therapy and local care are helpful.  5. For UTI, continue antibiotic therapy. Continue to follow up urine cultures to make sure it clears.  6.  GERD. Omeprazole, proton pump inhibitors as well as Zantac to help with the symptoms. Continue GI evaluation. May need to be scoped for possible gastroenteritis.  7.  Continue evaluation and treatment for possible DVT with DVT prophylaxis in the interim. Continue to follow the patient.  8.  Would recommend conservative cardiology input at this point. We will discuss the case with Dr. Saralyn Pilar and try to ensure it is arranged that she follows up for permanent pacemaker evaluation and interrogation.    ____________________________ Loran Senters Clayborn Bigness, MD ddc:ts D: 02/04/2014 13:12:00 ET T: 02/05/2014 03:31:26 ET JOB#: [id number]  cc: Dwayne D. Clayborn Bigness, MD, <Dictator> Yolonda Kida MD ELECTRONICALLY SIGNED 03/10/2014 11:19

## 2014-07-29 NOTE — Consult Note (Signed)
Pt did not have any neoplasm or obstruction at distal esophagus, slight Schatzki ring seen and dilated with 45 and 48 F dilators over a guide wire.  Mild antral gastritis biopsied for H. pylori.  nl duodenum.  From GI stand point she can go home when ever.  GI will not see her this weekend unless you call about a problem.  Electronic Signatures: Manya Silvas (MD)  (Signed on 06-Nov-15 08:25)  Authored  Last Updated: 06-Nov-15 08:25 by Manya Silvas (MD)

## 2014-07-29 NOTE — Discharge Summary (Signed)
PATIENT NAME:  Lori Henson, Lori Henson MR#:  [id number] DATE OF BIRTH:  [birth year]  DATE OF ADMISSION:  02/02/2014 DATE OF DISCHARGE:  02/10/2014  ADMITTING DIAGNOSES: Bilateral pneumonia likely due to aspiration, abdominal pain.   DISCHARGE DIAGNOSES:  1.  Bilateral pneumonia, suspected aspiration, resolving.   2.  Epigastric abdominal pain, status post esophagogastroduodenoscopy on 02/10/2014 showing antral gastritis.  3.  Hyponatremia due to hydrochlorothiazide.   4.  Hypertension.  5.  Osteoarthritis.  6.  Constipation.   7.  Right leg melanoma, status post excision and undergoing radiation therapy.  8.  Anxiety.  9.  Constipation likely due to fentanyl.   10.  Urinary tract infection due to Proteus mirabilis.    DISCHARGE CONDITION: Stable.   DISCHARGE MEDICATIONS: The patient is to continue:   1.  Deplin 15 mg capsule, 1 capsule once daily.  2.  Docusate sodium 100 mg p.o. 1 to 2 times daily.  3.  ICAPS antioxidant multivitamins and minerals capsule, 1 capsule twice daily.  4.  Magnesium oxide 250 mg 2 tablets once daily.  5.  Metoprolol succinate 50 mg p.o. daily.  6.  Paroxetine 10 mg p.o. daily.  7.  Clonazepam 0.25 mg twice daily.  8.  Remeron 30 mg p.o. at bedtime.  9.  Fentanyl topical patch at 25 mcg topically every 3 days as previously scheduled.  10.  Lisinopril 10 mg twice daily.  11.  Pantoprazole 40 mg p.o. twice daily.  12.  Levofloxacin 750 mg every 48 hours x 2 days.  13.  The patient is not to take aspirin or hydrochlorothiazide unless recommended by primary care physician.   HOME OXYGEN: None.   DIET: Two grams salt, low-fat, low-cholesterol diet. Consistency: Mechanical soft with thin liquids. General aspiration precautions. Medications in pureed food as necessary for safer, easier swallowing. Assist with meals due to overall weakness. Flavor and moisten foods. Dietary supplements per dietitian.   ACTIVITY LIMITATIONS: As tolerated.   REFERRAL: To home health  physical therapy.     FOLLOWUP APPOINTMENTS: With Dr. Einar Pheasant in 2 days after discharge, Dr. Vira Agar in 1 week after discharge.    CONSULTANTS: Care management, social work, physical therapy, Dr. Vira Agar, Dr. Ermalinda Memos, Dr. Clayborn Bigness, speech therapy.   RADIOLOGIC STUDIES: Chest x-ray, PA and lateral, 02/02/2014 showed bilateral lower lobe pneumonia, right worse than left. CT scan of abdomen and pelvis with contrast 02/02/2014 revealed no evidence of bowel obstruction, persistent  narrowing of the gastric antrum, nonspecific, possibly reflecting underlying gastritis, patchy opacities in bilateral lung bases suspicious for pneumonia, possibly on the basis of aspiration. Chest x-ray, portable single view, 02/06/2014 showed increase in pleural effusion and edema. No change in bilateral lower lobe pneumonia. Upper GI with barium swallowing 02/09/2014 revealed mild changes of presbyesophagus, however, there is high grade narrowing at GE junction, which never relaxed under fluoroscopic observation,  direct visualization was recommended. Evaluation of the stomach is quite limited due to the limited volume of barium present. Gastric emptying however appeared prompt. There is partial compression of L1 through L3 vertebral bodies. Echocardiogram 02/07/2014 revealed left ventricular ejection fraction by visual estimation 60 to 65%, normal global left ventricular systolic function, decreased left ventricular internal cavity size, moderately dilated left atrium, moderately dilated right atrium, moderate mitral valve regurgitation, moderate to severe tricuspid regurgitation, mildly elevated pulmonary arterial systolic pressure, mildly increased left ventricular posterior wall thickness.   HOSPITAL COURSE: The patient is a 81 year old Caucasian female with past medical history significant for history of  right lower extremity melanoma with no evidence of recurrence who presents to the hospital with complaints of abdominal  pain as well as hypoxia. Please refer to Dr. Serita Grit admission note on 02/02/2014.   On arrival to the hospital to Emergency Room the patient's temperature was 97.6, pulse was 62, respiration rate was 24, blood pressure 183/79, saturation was 80% on room air. Physical exam showed bilateral rhonchi without accessory muscle usage. Abdominal exam revealed tenderness in the epigastric region but no other abnormalities were found. The patient's labs done on admission to the hospital 02/02/2014 showed sodium level of 135, glucose of 142, otherwise, BMP was unremarkable. The patient's lipase level was normal at 122. Liver enzymes showed albumin level of 3.3, alkaline phosphatase 138, otherwise, liver enzymes were normal. Troponin was 0.02. White blood cell count was elevated to 11.1, hemoglobin was 12.7, platelet count was 207. Absolute neutrophil count was elevated at 9.8. Urinalysis showed 3+ leukocyte esterase, 249 red blood cells, 67 white blood cells. The patient's urine culture showed Proteus mirabilis more than 100,000 colony forming units sensitive to multiple antibiotics except to nitrofurantoin.   The patient was admitted to the hospital for further evaluation. She was hydrated. She was treated with antibiotics for suspected aspiration pneumonitis. Speech therapist was involved and recommended a soft, mechanical soft diet. As time progressed her hypoxia improved as well as her abdominal pain, however, since she was still intermittently uncomfortable, consultation with Dr. Vira Agar was obtained, gastroenterologist. Gastroenterologist felt that the patient may benefit from EGD, however, because of her respiratory status, EGD was not performed initially on admission. The patient underwent upper GI endoscopy on the day of discharge, 02/10/2014, which showed mild Schatzki ring, which was dilated, gastritis, which was biopsied, and normal examined duodenum. Dr. Vira Agar recommended proton pump inhibitor and follow up  with him in the next 1 week.  The patient was evaluated by physical therapist and recommended home health physical therapy. She is being discharged home with above-mentioned medications and followup.   On the day of discharge temperature is 98, pulse was 61, respiration rate was 20, blood pressure 113/64, saturation 94% on room air at rest.   Of note, the patient's sodium level was low and it was felt that patient's hyponatremia was very likely hydrochlorothiazide related. In regards to gastritis,  it was felt that the patient should hold her aspirin therapy for some period of time and unless indicated to resume upon recommendations by her primary care physician. For other chronic medical problems, the patient is to resume all outpatient medications. The patient is being discharged in stable condition with the above-mentioned medications and followup. The patient is to finish her antibiotics and continue PPIs.   TIME SPENT: 40 minutes.      ____________________________ Theodoro Grist, MD rv:AT D: 02/10/2014 18:04:09 ET T: 02/11/2014 03:19:25 ET JOB#: [id number]  cc: Theodoro Grist, MD, <Dictator> Einar Pheasant, MD Manya Silvas, MD  [place]rake MD ELECTRONICALLY SIGNED 03/05/2014 20:03

## 2014-07-29 NOTE — Consult Note (Signed)
PATIENT NAME:  Lori Henson, ABBETT MR#:  [id number] DATE OF BIRTH:  1919/04/24  DATE OF CONSULTATION:  02/03/2014  REFERRING PHYSICIAN:   CONSULTING PHYSICIAN:  Joelene Millin A. Jerelene Redden, ANP (Adult Nurse Practitioner)  REFERRING PHYSICIAN:  Posey Pronto, MD    CONSULTING PHYSICIAN: Gaylyn Cheers, MD/Petros Ahart Jerelene Redden, ANP (adult nurse practitioner)   REASON FOR CONSULTATION: Abdominal pain.   HISTORY OF PRESENT ILLNESS: This 81 year old patient of Dr. Einar Pheasant, reports she was in her usual state of health until yesterday afternoon at 1:00 p.m. She had eaten a normal lunch at her assisted-living facility and developed acute episode of epigastric pain, nausea, and vomited 2 times, followed by diarrhea.  She reports  vomiting in the toilet without coughiing or sense that she aspirated the contents. No coffee-ground emesis or hematemesis. She shortly developed diarrhea to loose stools about 2 times. She was having bilateral upper abdominal pain across her umbilicus and this was a sharp, intermittent pain that was quite severe for the next few hours.   She presented to the emergency room with these symptoms and a CT of the abdomen showed bilateral pneumonia confirmed by chest x-ray. The abdominal  CT also revealed cardiomegaly with pacemaker leads. There was a hypervascular lesion/altered perfusion along the medial segment left hepatic lobe. The patient has had a cholecystectomy. There was no intrahepatic ductal dilatation. There was mild common bile duct dilatation, but smoothly tapered at the ampulla. The pancreas and spleen were within normals. 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 The stomach showed narrowing of the gastric antrum that persisted on delayed imaging, raising the possibility of underlying abnormality, most likely gastritis. There was contrast in the small bowel indicating this did not reflect anatomic obstruction. The appendix was not well visualized but there were no findings to  suggest acute appendicitis. There was a 3.2 cm left ovarian cyst previously 2.4 cm, thought to be benign, no right adnexal mass. No evidence of bowel obstruction.   The patient has received pain medication and says she is feeling better with the pain medication. She has received IV antibiotics. She denies fevers or chills, cough, or any symptoms of pneumonia or URI prior to yesterday. She denies any stomach pain prior to yesterday. She denies NSAID use, heartburn, reflux, gradual weight loss, change in appetite, diet or bowel habits; she did have a colonoscopy done in 2005; that was normal.   PAST MEDICAL HISTORY: 1.  Hypertension.  2.  History of urinary tract infections.  3.  Osteoarthritis.  4.  Melanoma, involving the right leg status-post resection with recurrence receiving radiation therapy, followed by Dr. Oliva Bustard; his note reviewed and says there is no evidence of active malignancy.   PAST SURGICAL HISTORY:  1.  Pacemaker placement.  2.  Cholecystectomy.  3.  Hernia surgery.  4.  Lens implant.  5.  Kyphoplasty.  6.  Cystocele repair.  7.  Inguinal hernia repair in 1987.  8.  Hemorrhoidectomy.  9.  Bladder surgery done for incontinence.   FAMILY HISTORY: Positive for colon, ovarian and uterine cancer.   SOCIAL HISTORY: Negative tobacco, alcohol, or drug use.   HOME MEDICATIONS:  1.  Paroxetine 10 mg daily.  2.  Metoprolol succinate 50 mg daily.  3.  Magnesium hydroxide 250 tablets at daily.  4.  Hydrochlorothiazide 25 mg once daily.  5.  Colace 100 mg 1 tablet twice daily.  6.  Deplin 50 mg daily.  7.  Clonazepam 0.5 mg twice daily.  8.  Aspirin 81 mg daily.   ALLERGIES: MACROBID, CAUSES RASH.   REVIEW OF SYSTEMS:  Ten systems reviewed, the patient has fatigue and weakness; she had acute abdominal pain, started yesterday with nausea, vomiting and diarrhea, it has eased off. No diarrhea today. She denies antibiotic exposure prior to this; x-ray showed pneumonia changes. The  patient denies cough, sputum production, fevers or chills. She does have fatigue, feeling very sleepy, as she had interrupted sleep last night; remaining other systems otherwise negative.   DIAGNOSTIC DATA:  1.  Admission blood work 02/02/2014, with sodium 135, BUN 18, creatinine 1.12, albumin 3.3, alkaline phosphatase 138, AST is 31, total bilirubin 0.7, troponin 0.02, WBC 11.1, now down to 10.3, hemoglobin 12.7, now 11.7; urine clean catch colony is too small to read, it was positive for RBC.  2.  EKG on admission junctional rhythm, left ventricular hypertrophy with repolarization abnormality.  3.  Radiology, CT of the abdomen and pelvis with contrast performed on admission showed patchy opacities bilateral lung bases, right greater than left, suspicious for pneumonia, possibility of aspiration, there is cardiomegaly.  No evidence of bowel obstruction. Narrowing of the gastric antrum, nonspecific possibly underlying gastritis.  4.  Chest x-ray PA and lateral revealed bilateral lower lobe pneumonia right worse than the left. There is no pleural effusion or pneumothorax. Heart is top normal size. There is dual lead AICD.   PHYSICAL EXAMINATION:  VITAL SIGNS: Temperature 98.2, pulse 66, blood pressure 110/66, pulse oximetry 2 liters 92%.  GENERAL: Elderly Caucasian female lying flat in bed, trying to rest. She arouses easily. She is alert and oriented, excellent historian. The patient says she is feeling some better. Still has mild epigastric discomfort, points to the area across the umbilicus bilateral. She denies any nausea or vomiting, but she has not been eating.  HEENT: Shows head is normocephalic. Conjunctivae pink. Sclerae is anicteric.  NECK: Supple, trachea midline.  HEART: Heart tones S1, S2 without murmur, rub or gallop.  LUNGS: With rhonchi, left base greater than right when she is lying dependent. Respirations are nonlabored. She is using oxygen per nasal cannula.  ABDOMEN: Soft, mild  epigastric tenderness with palpation, cannot palpate for HSM hepatosplenomegaly. Abdomen is soft, nondistended, normal bowel sounds.  RECTAL: Deferred.  SKIN: Warm and dry without rash or edema.  MUSCULOSKELETAL: No joint pain, swelling or inflammation.  PSYCHIATRIC: She is drowsy, arouses easily and affect and mood very pleasant.  NEUROLOGIC: Cranial nerves grossly intact.   IMPRESSION: This 81 year old patient has history of melanoma, hypertension and automatic implantable cardiac defibrillator pacemaker with mild cardiomegaly presents with acute epigastric pain, nausea, vomiting and diarrhea. Imaging studies show bilateral pneumonia for which she is being treated, there is possible urinary tract infection. The patient has a history of melanoma as noted, is followed by Dr. Oliva Bustard. CT suggested gastritis; etiology of her gastrointestinal complaints to include gastroenteritis, rule out adhesive disease, rule out partial bowel obstruction.   PLAN:  1.  EKG was performed. We will check BNP to make sure patient does not have heart failure which can mimic pneumonia; continue with Protonix IV 20 mg twice daily.  2.  Patient is on antibiotic therapy with Levaquin.  3.  With active pneumonia, she is not an ideal luminal candidate at this time.  4.  Recommend monitoring clinical course over the next day.  5.  Further GI recommendations pending laboratory studies.   This case was discussed with Dr. Vira Agar, for clarification of care.   These services provided  by Janalyn Harder Jerelene Redden, MS, APRN, BC, ANP (Adult Nurse Practitioner) under collaborative agreement with Dr. Vira Agar, MD.  _ ___________________________ Janalyn Harder. Jerelene Redden, ANP (Adult Nurse Practitioner) kam:nt D: 02/03/2014 15:17:04 ET T: 02/03/2014 15:48:18 ET JOB#: [id number]  cc: Joelene Millin A. Jerelene Redden, ANP (Adult Nurse Practitioner), <Dictator> Janalyn Harder Sherlyn Hay, MSN, ANP-BC Adult Nurse Practitioner ELECTRONICALLY SIGNED 02/03/2014 17:27

## 2014-07-29 NOTE — Consult Note (Signed)
See note Dawson Bills, NP.  The patient has bilat fluid in lower lungs and also has a BNP of over 15,000 suggesting some degree of CHF in this clinical picture.  PE shows bilat insp crackles lower lung fields.  Abd mild distended and bowel sounds present but reduced, no peritoneal signs.  Given vomiting and diarrhea possible acute gastroenteritis of uncertain origin which is now somewhat improved.  Can't rule out partial SBO.  Will follow with you.  No endoscopic plans at this time.  Electronic Signatures: Manya Silvas (MD)  (Signed on 30-Oct-15 19:23)  Authored  Last Updated: 30-Oct-15 19:23 by Manya Silvas (MD)

## 2014-07-29 NOTE — Consult Note (Signed)
Pt with Right and mid abd pain still, has some heaving with this.  Due to this and abn CT I plan to do an EGD tomorrow, discussed with patient.  Pt with Proteus UTI,  PE mild diffuse tenderness, no distention, chest clear in anterior lateral fields.  Electronic Signatures: Manya Silvas (MD)  (Signed on 01-Nov-15 12:21)  Authored  Last Updated: 01-Nov-15 12:21 by Manya Silvas (MD)

## 2014-07-29 NOTE — Consult Note (Signed)
Pt with hx of pacemaker and the time is now to get it rechecked for battery running low.  Will ask [facility] cardiologist when she needs to be seen for this.  She has minimal discomfort in RUQ, No vomiting, no diarrhea.  She takes Sorbitol at home and she and husband wonder if this could be given to her now.  Chest exam with bilateral crackles in lower lung fields. Abd non tender to coughing and palpation.  Pt on soft diet. No new recommendations.    Electronic Signatures: Manya Silvas (MD)  (Signed on 31-Oct-15 11:14)  Authored  Last Updated: 31-Oct-15 11:14 by Manya Silvas (MD)

## 2014-07-29 NOTE — H&P (Signed)
PATIENT NAME:  Lori Henson, Lori Henson MR#:  [id number] DATE OF BIRTH:  1919-07-18  DATE OF ADMISSION:  02/02/2014  PRIMARY CARE PROVIDER: Einar Pheasant, MD  EMERGENCY DEPARTMENT REFERRING PHYSICIAN: Briant Sites. Joni Fears, MD   CHIEF COMPLAINT: Abdominal pain, hypoxia.   HISTORY OF PRESENT ILLNESS: The patient is a 81 year old white female with history of melanoma with no evidence of recurrence who was doing well up until this afternoon when she started having sharp epigastric pain associated with nausea and vomiting. The patient had a large episode of emesis. Due to these symptoms, she came to the ED, and she was being evaluated for her abdominal pain, had a CT of the abdomen which showed bilateral pneumonia, which was confirmed by the chest x-ray. There was also concern of aspiration with this pneumonia. According to the patient and her husband, they do not feel that when she was having the emesis she aspirated. The patient has not had any fevers or cough or chills. Denies any chest pains or palpitations. She reports that prior to this episode she did not have any abdominal pain. She does have a chronic wound on the left, which is being followed as outpatient.   PAST MEDICAL HISTORY:  1.  Significant for history of hypertension. 2.  History of UTIs in the past.  3.  History of osteoarthritis.  4.  Status post pacemaker placement. 5.  Cholecystectomy. 6.  Hernia surgery.  7.  Lens implant.  8.  Kyphoplasty.  9.  History of melanoma involving the right leg, status post resection with recurrence, receiving radiation therapy.   10.  History of cystocele, status post repair. 11.  History of inguinal hernia repair in 1987. 12.  Status post hemorrhoidectomy. 13.  Status post bladder surgery done for incontinence.   ALLERGIES: MACROBID.   FAMILY HISTORY: Positive for colon, ovarian and uterine cancer.   SOCIAL HISTORY: No smoking. No alcohol use. No drug use.   MEDICATIONS: Paroxetine 10 daily,  metoprolol succinate 50 daily, magnesium hydroxide 250 two tablets daily, hydrochlorothiazide 25 one tablet p.o. daily, Colace 100 one tablet p.o. b.i.d., Deplin 50 mg daily, clonazepam 0.5 one tablet p.o. b.i.d., aspirin 81 one tablet p.o. daily.   REVIEW OF SYSTEMS: CONSTITUTIONAL: Denies any fevers. Complains of fatigue, weakness. No weight loss. No weight gain.  EYES: No blurred or double vision. No redness. No inflammation.  EARS, NOSE AND THROAT: No tinnitus. No ear pain. No seasonal or year-round allergies. No difficulty swallowing.  RESPIRATORY: Denies any cough, wheezing, hemoptysis. No COPD.  CARDIOVASCULAR: Denies any chest pain, orthopnea, edema or arrhythmia.  GASTROINTESTINAL: Complains of nausea, vomiting, epigastric pain. No hematemesis.  GENITOURINARY: Denies any dysuria, hematuria, renal calculus or frequency.  ENDOCRINE: Denies any polyuria, nocturia, or thyroid problems.  HEMATOLOGIC AND LYMPHATIC: Denies anemia, easy bruisability or bleeding.  SKIN: No acne. No rash.  MUSCULOSKELETAL: Denies any pain in the neck, back or shoulder.  NEUROLOGIC: No numbness, CVA, TIA or seizures.  PSYCHIATRIC: Has a history of anxiety disorder.   PHYSICAL EXAMINATION:  VITAL SIGNS: Temperature 97.6, pulse 62, respirations 24, blood pressure 183/79, oxygen saturation dropped into the 80s on room air.  GENERAL: The patient is a well-developed female, slightly anxious.  HEENT: Head atraumatic, normocephalic. Pupils equally round, reactive to light and accommodation. There is no conjunctival pallor, no scleral icterus. Nasal examination shows no drainage or ulceration. Oropharynx was clear without any exudate.  NECK: Supple without any JVD. No thyromegaly.  LUNGS: She has bilateral rhonchi without any  accessory muscle usage. CARDIOVASCULAR: Regular rate and rhythm. No murmurs, rubs, clicks or gallops.  ABDOMEN: She has a protuberant abdomen. Tenderness in the epigastric region. There is no  guarding, no rebound. Positive bowel sounds x 4. No hepatosplenomegaly.  EXTREMITIES: No clubbing, cyanosis, edema.  SKIN: She has chronic right lower extremity wound with dressing in place.  LYMPH NODES: Nonpalpable.  VASCULAR: Good DP, PT pulses.  PSYCHIATRIC: A little anxious. NEUROLOGICAL: Cranial nerves II-XII grossly intact. No focal deficits.   LABORATORY AND RADIOLOGIC EVALUATIONS: PA and lateral chest x-ray shows bilateral lower lobe, right worse than left. CT of the abdomen shows no evidence of bowel obstruction, persistent narrowing of the gastric antrum, possible nonspecific, possible reflecting underlying gastritis, moderate to severe compression fracture deformity of L1, mild compression fracture deformity L2, moderate compression fracture deformity at L3. Admitting WBC count 11.1, hemoglobin 12.7, platelet count 207,000. BMP: Glucose 142, BUN 18, creatinine 1.12, sodium 135, potassium 3.6, chloride 93, CO2 of 30. LFTs: Alkaline phosphatase 138, ALT 25, AST 31, total protein 7.7, lipase 122. Leukocytes 3+, rbc's 249, wbc's 67.   ASSESSMENT AND PLAN: The patient is a 81 year old white female with history of melanoma with no evidence of recurrence, hypertension, started having epigastric pain in the afternoon, has been having nausea, vomiting. CT of the abdomen shows possible pneumonia.  1.  Bilateral pneumonia, possibly due to the aspiration. We will treat with IV Levaquin. Swallow evaluation.  2.  Abdominal pain. CT with possible antral narrowing. PPIs b.i.d., pain control, GI consult. We will give her clear liquids. 3.  Hypertension. The patient on hydrochlorothiazide and metoprolol. We will continue her blood pressure medications.  4.  Possible urinary tract infection. We will treat with IV Levaquin, follow urine cultures.  5.  Miscellaneous. Lovenox for deep venous thrombosis prophylaxis.   CODE STATUS: Discussed with the patient and husband. She wants to be a DNR. We will make her  DNR.  TIME SPENT: Fifty minutes.    ____________________________ Lafonda Mosses Posey Pronto, MD shp:TT D: 02/02/2014 21:10:30 ET T: 02/02/2014 21:38:51 ET JOB#: [id number]  cc: Emaley Applin H. Posey Pronto, MD, <Dictator> Alric Seton MD ELECTRONICALLY SIGNED 02/22/2014 19:25

## 2014-07-29 NOTE — Consult Note (Signed)
Pt eating well but still has pain.  Will get BS and UGI tomorrow since her cardiopul disease makes her somewhat high risk for sedation procedures.  Electronic Signatures: Manya Silvas (MD)  (Signed on 04-Nov-15 18:09)  Authored  Last Updated: 04-Nov-15 18:09 by Manya Silvas (MD)

## 2014-07-31 LAB — SURGICAL PATHOLOGY

## 2014-09-19 ENCOUNTER — Ambulatory Visit (INDEPENDENT_AMBULATORY_CARE_PROVIDER_SITE_OTHER): Payer: Medicare Other | Admitting: Nurse Practitioner

## 2014-09-19 ENCOUNTER — Encounter: Payer: Self-pay | Admitting: Nurse Practitioner

## 2014-09-19 VITALS — BP 144/82 | HR 86 | Temp 97.7°F | Resp 14 | Ht 66.0 in | Wt 121.8 lb

## 2014-09-19 DIAGNOSIS — R300 Dysuria: Secondary | ICD-10-CM | POA: Diagnosis not present

## 2014-09-19 DIAGNOSIS — L539 Erythematous condition, unspecified: Secondary | ICD-10-CM

## 2014-09-19 LAB — POCT URINALYSIS DIPSTICK
Bilirubin, UA: NEGATIVE
Glucose, UA: NEGATIVE
Ketones, UA: NEGATIVE
NITRITE UA: POSITIVE
PH UA: 7
Protein, UA: NEGATIVE
Spec Grav, UA: 1.015
Urobilinogen, UA: 0.2

## 2014-09-19 MED ORDER — CIPROFLOXACIN HCL 250 MG PO TABS: 250.0000 mg | ORAL_TABLET | Freq: Two times a day (BID) | ORAL | Status: DC

## 2014-09-19 NOTE — Patient Instructions (Signed)
Cipro twice daily for 5 days (yogurt is recommended while on antibiotics or if you have oral probiotics).   Azo- helps with stinging sensation   Cool compresses on your face, continue using the face cream as needed. Wear sunscreen if going outside.   Call us if worsening or no improvement in a week.

## 2014-09-19 NOTE — Assessment & Plan Note (Signed)
Facial erythema. Improving on its own. Do not see any reason for infectious process or acute issues. Continue face cream and will start cool compresses on face. Asked her to call if worsening or failure to improve.

## 2014-09-19 NOTE — Assessment & Plan Note (Signed)
POCT urine positive for many things, will culture and start Cipro 250 bid x 5 days. Azo OTC for stinging/burning sensation, drink water and cranberry juice. Will follow.

## 2014-09-19 NOTE — Progress Notes (Signed)
Pre visit review using our clinic review tool, if applicable. No additional management support is needed unless otherwise documented below in the visit note. 

## 2014-09-19 NOTE — Progress Notes (Signed)
   Subjective:    Patient ID: Lori Henson, female    DOB: [birth year], 81 y.o.   MRN: [id number]  HPI  [name] is a 81 yo female with a CC of red spot on face left side   1) Brought urine from home x 1 month, dysuria, drinks cranberry juice   2) Redness on left side of face, denies itching, pain, fever or swollen glands. She noticed it Saturday and people were asking her about it. She has a hx of skin cancer and wanted to have it checked out. She reports it is improving today, does not affect her vision, and no one has pointed it out to her. She has tried neutrogena face cream and vasaline. Denies pain with EOM.   Review of Systems  Constitutional: Negative for fever, chills, diaphoresis and fatigue.  HENT: Negative for tinnitus and trouble swallowing.   Eyes: Negative for photophobia, pain, discharge, redness, itching and visual disturbance.  Respiratory: Negative for chest tightness, shortness of breath and wheezing.   Cardiovascular: Negative for chest pain, palpitations and leg swelling.  Gastrointestinal: Negative for nausea, vomiting and diarrhea.  Genitourinary: Positive for dysuria. Negative for urgency, frequency, hematuria, flank pain and difficulty urinating.  Skin: Positive for color change. Negative for pallor, rash and wound.  Neurological: Negative for dizziness and headaches.      Objective:   Physical Exam  Constitutional: She is oriented to person, place, and time. She appears well-developed and well-nourished. No distress.  BP 144/82 mmHg  Pulse 86  Temp(Src) 97.7 F (36.5 C) (Oral)  Resp 14  Ht 5\' 6"  (1.676 m)  Wt 121 lb 12.8 oz (55.248 kg)  BMI 19.67 kg/m2  SpO2 95%   HENT:  Head: Normocephalic and atraumatic.  Right Ear: External ear normal.  Left Ear: External ear normal.  Eyes: Conjunctivae and EOM are normal. Pupils are equal, round, and reactive to light. Right eye exhibits no discharge. Left eye exhibits no discharge. No scleral icterus.     Underneath eye on left is slightly more swollen and erythematous, right side is less, but still has some swelling and erythema. Dry flaky skin apparent, EOM normal   Cardiovascular: Normal rate, regular rhythm and normal heart sounds.  Exam reveals no gallop and no friction rub.   No murmur heard. Pulmonary/Chest: Effort normal and breath sounds normal. No respiratory distress. She has no wheezes. She has no rales. She exhibits no tenderness.  Neurological: She is alert and oriented to person, place, and time. No cranial nerve deficit. She exhibits normal muscle tone. Coordination normal.  Skin: Skin is warm and dry. No rash noted. She is not diaphoretic. There is erythema.  Psychiatric: She has a normal mood and affect. Her behavior is normal. Judgment and thought content normal.      Assessment & Plan:

## 2014-09-22 LAB — URINE CULTURE: Colony Count: >=100000

## 2014-10-06 ENCOUNTER — Telehealth: Payer: Self-pay

## 2014-10-06 NOTE — Telephone Encounter (Signed)
pt states that she is taking the klonopin.5mg  1/2 table twice a day.  pt and her husband state that they feel like she needs to have increased some more becaue she is still feeling anxiety

## 2014-10-10 ENCOUNTER — Other Ambulatory Visit: Payer: Self-pay | Admitting: *Deleted

## 2014-10-10 MED ORDER — LISINOPRIL 10 MG PO TABS: 10.0000 mg | ORAL_TABLET | Freq: Two times a day (BID) | ORAL | Status: DC

## 2014-10-12 ENCOUNTER — Ambulatory Visit (INDEPENDENT_AMBULATORY_CARE_PROVIDER_SITE_OTHER): Payer: Medicare Other | Admitting: Internal Medicine

## 2014-10-12 ENCOUNTER — Encounter: Payer: Self-pay | Admitting: Internal Medicine

## 2014-10-12 VITALS — BP 130/70 | HR 87 | Temp 98.5°F | Resp 18 | Ht 66.0 in | Wt 124.5 lb

## 2014-10-12 DIAGNOSIS — R059 Cough, unspecified: Secondary | ICD-10-CM

## 2014-10-12 DIAGNOSIS — R05 Cough: Secondary | ICD-10-CM

## 2014-10-12 DIAGNOSIS — I10 Essential (primary) hypertension: Secondary | ICD-10-CM | POA: Diagnosis not present

## 2014-10-12 DIAGNOSIS — F329 Major depressive disorder, single episode, unspecified: Secondary | ICD-10-CM | POA: Diagnosis not present

## 2014-10-12 DIAGNOSIS — K5900 Constipation, unspecified: Secondary | ICD-10-CM | POA: Diagnosis not present

## 2014-10-12 DIAGNOSIS — R600 Localized edema: Secondary | ICD-10-CM

## 2014-10-12 DIAGNOSIS — K5909 Other constipation: Secondary | ICD-10-CM

## 2014-10-12 DIAGNOSIS — E871 Hypo-osmolality and hyponatremia: Secondary | ICD-10-CM

## 2014-10-12 DIAGNOSIS — C439 Malignant melanoma of skin, unspecified: Secondary | ICD-10-CM

## 2014-10-12 DIAGNOSIS — K2970 Gastritis, unspecified, without bleeding: Secondary | ICD-10-CM

## 2014-10-12 DIAGNOSIS — F32A Depression, unspecified: Secondary | ICD-10-CM

## 2014-10-12 NOTE — Progress Notes (Signed)
Patient ID: Lori Henson, female   DOB: 10-03-1919, 81 y.o.   MRN: [id number]   Subjective:    Patient ID: Lori Henson, female    DOB: [birth year], 81 y.o.   MRN: [id number]  HPI  Patient here for a scheduled follow up.   She has noticed some coughing.  Worse when she lies down.  Also has noticed when she eats.  No increased nasal congestion or drainage.  No sore throat.  No dysphagia.  No chest congestion.  No sob.  Eating better.  Feels better. Energy is better.  Still some fatigue, but overall better.  Husband reports some decreased hearing.  She feels she can hear fine.  Declines hearing evaluation.  Had tried zantac x 1.  Made her have diarrhea.  Tried again.  Reports some diarrhea.  Not sure if zantac caused.  Is normally constipated.  Takes lactulose.     Past Medical History  Diagnosis Date  . Malignant melanoma 2001    right leg, s/p radiation, recurrence 2008  . Hypertension   . 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 Lichen planus   . Angular cheilitis   . Osteoporosis     fosamax, compression fx s/p kyphoplasty    Current Outpatient Prescriptions on File Prior to Visit  Medication Sig Dispense Refill  . calcium carbonate (TUMS EX) 750 MG chewable tablet Chew 1 tablet by mouth 2 (two) times daily.    Sarajane Marek Sodium 30-100 MG CAPS Take by mouth daily.     . Cholecalciferol ([employer]S VITAMIN D3) 1000 UNITS capsule Take 1,000 Units by mouth daily.    . clonazePAM (KLONOPIN) 0.5 MG tablet Take 0.5 mg by mouth 2 (two) times daily as needed. Takes 1 1/2 tablet qhs    . lisinopril (PRINIVIL,ZESTRIL) 10 MG tablet Take 1 tablet (10 mg total) by mouth 2 (two) times daily. 60 tablet 5  . metoprolol succinate (TOPROL-XL) 50 MG 24 hr tablet Take 50 mg by mouth daily.  0  . Multiple Vitamins-Minerals (ICAPS) CAPS Take by mouth 2 (two) times daily.     Marland Kitchen PARoxetine (PAXIL) 10 MG tablet   0  . Sorbitol SOLN Does not apply route as needed 1000 mL 3   No current  facility-administered medications on file prior to visit.    Review of Systems  Constitutional: Positive for fatigue. Negative for appetite change and unexpected weight change.  HENT: Negative for congestion and sinus pressure.   Respiratory: Positive for cough. Negative for chest tightness and shortness of breath.   Cardiovascular: Negative for chest pain, palpitations and leg swelling.  Gastrointestinal: Negative for nausea, vomiting, abdominal pain and diarrhea (no significant diarrhea.).  Genitourinary: Negative for dysuria and difficulty urinating.  Skin: Negative for color change and rash.  Neurological: Negative for dizziness, light-headedness and headaches.  Hematological: Negative for adenopathy. Does not bruise/bleed easily.  Psychiatric/Behavioral: Negative for dysphoric mood and agitation.       Objective:     Blood pressure recheck:  132.78  Physical Exam  Constitutional: She appears well-developed and well-nourished. No distress.  HENT:  Nose: Nose normal.  Mouth/Throat: Oropharynx is clear and moist.  Neck: Neck supple. No thyromegaly present.  Cardiovascular: Normal rate and regular rhythm.   Pulmonary/Chest: Breath sounds normal. No respiratory distress. She has no wheezes.  Abdominal: Soft. Bowel sounds are normal. There is no tenderness.  Musculoskeletal: She exhibits no edema or tenderness.  Lymphadenopathy:    She has no cervical adenopathy.  Skin: No rash noted. No erythema.  Psychiatric: She has a normal mood and affect. Her behavior is normal.    BP 130/70 mmHg  Pulse 87  Temp(Src) 98.5 F (36.9 C) (Oral)  Resp 18  Ht 5\' 6"  (1.676 m)  Wt 124 lb 8 oz (56.473 kg)  BMI 20.10 kg/m2  SpO2 94% Wt Readings from Last 3 Encounters:  10/12/14 124 lb 8 oz (56.473 kg)  09/19/14 121 lb 12.8 oz (55.248 kg)  06/08/14 119 lb (53.978 kg)     Lab Results  Component Value Date   WBC 5.5 07/11/2014   HGB 12.9 07/11/2014   HCT 38.5 07/11/2014   PLT 211  07/11/2014   GLUCOSE 114* 07/11/2014   ALT 20 07/11/2014   AST 25 07/11/2014   NA 131* 07/11/2014   K 4.1 07/11/2014   CL 96* 07/11/2014   CREATININE 0.65 07/11/2014   BUN 21* 07/11/2014   CO2 32 07/11/2014   TSH 1.67 06/08/2014       Assessment & Plan:   Problem List Items Addressed This Visit    Chronic constipation    Taking lactulose.  Appears to be improved.       Cough    Occurs mostly when lying down or eating.  See above for treatment of acid reflux.  Lungs clear.  Follow.        Depression    Doing better.  Follow.       Relevant Medications   mirtazapine (REMERON) 30 MG tablet   Gastritis    Found on EGD.  She has concerns about taking PPI.  Felt zantac possibly contributed to diarrhea.  We discussed trying and different H2 blocker.  Also discussed decreased dose - 75mg  ranitidine.  May be contributing to cough.  Also, could have her decrease amount of lactulose some if takes zantac.  Follow.        Hypertension - Primary    Blood pressure is doing well on her current regimen.  Follow pressures.  Follow metabolic panel.       Hyponatremia    Follow sodium.        Lower extremity edema    Better.  Continue compression hose.        Malignant melanoma    Has been released by Dr Oliva Bustard.  Continue f/u with dermatology.         I spent 25 minutes with the patient and more than 50% of the time was spent in consultation regarding the above.     Einar Pheasant, MD

## 2014-10-12 NOTE — Progress Notes (Signed)
Pre visit review using our clinic review tool, if applicable. No additional management support is needed unless otherwise documented below in the visit note. 

## 2014-10-15 ENCOUNTER — Encounter: Payer: Self-pay | Admitting: Internal Medicine

## 2014-10-15 DIAGNOSIS — R059 Cough, unspecified: Secondary | ICD-10-CM | POA: Insufficient documentation

## 2014-10-15 DIAGNOSIS — R05 Cough: Secondary | ICD-10-CM | POA: Insufficient documentation

## 2014-10-15 NOTE — Assessment & Plan Note (Signed)
Taking lactulose.  Appears to be improved.

## 2014-10-15 NOTE — Assessment & Plan Note (Signed)
Blood pressure is doing well on her current regimen.  Follow pressures.  Follow metabolic panel.

## 2014-10-15 NOTE — Assessment & Plan Note (Signed)
Occurs mostly when lying down or eating.  See above for treatment of acid reflux.  Lungs clear.  Follow.

## 2014-10-15 NOTE — Assessment & Plan Note (Signed)
Doing better.  Follow.   

## 2014-10-15 NOTE — Assessment & Plan Note (Signed)
Follow sodium.  

## 2014-10-15 NOTE — Assessment & Plan Note (Signed)
Has been released by Dr Oliva Bustard.  Continue f/u with dermatology.

## 2014-10-15 NOTE — Assessment & Plan Note (Signed)
Found on EGD.  She has concerns about taking PPI.  Felt zantac possibly contributed to diarrhea.  We discussed trying and different H2 blocker.  Also discussed decreased dose - 75mg  ranitidine.  May be contributing to cough.  Also, could have her decrease amount of lactulose some if takes zantac.  Follow.

## 2014-10-15 NOTE — Assessment & Plan Note (Signed)
Better.  Continue compression hose.  

## 2014-10-19 NOTE — Telephone Encounter (Signed)
Will not be able to titrate the dose of Klonopin due to high risk of falls associate with medication. If she continues to feel anxious, will go higher on Paxil.   Please advise pt.

## 2014-10-21 ENCOUNTER — Emergency Department
Admission: EM | Admit: 2014-10-21 | Discharge: 2014-10-21 | Disposition: A | Discharge status: Home / Self Care | Payer: Medicare Other | Attending: Student | Admitting: Student

## 2014-10-21 ENCOUNTER — Encounter: Payer: Self-pay | Admitting: Emergency Medicine

## 2014-10-21 ENCOUNTER — Emergency Department: Payer: Medicare Other

## 2014-10-21 DIAGNOSIS — W010XXA Fall on same level from slipping, tripping and stumbling without subsequent striking against object, initial encounter: Secondary | ICD-10-CM | POA: Diagnosis not present

## 2014-10-21 DIAGNOSIS — Y9289 Other specified places as the place of occurrence of the external cause: Secondary | ICD-10-CM | POA: Diagnosis not present

## 2014-10-21 DIAGNOSIS — Y998 Other external cause status: Secondary | ICD-10-CM | POA: Diagnosis not present

## 2014-10-21 DIAGNOSIS — S5012XA Contusion of left forearm, initial encounter: Secondary | ICD-10-CM | POA: Insufficient documentation

## 2014-10-21 DIAGNOSIS — I10 Essential (primary) hypertension: Secondary | ICD-10-CM | POA: Insufficient documentation

## 2014-10-21 DIAGNOSIS — Y9389 Activity, other specified: Secondary | ICD-10-CM | POA: Diagnosis not present

## 2014-10-21 DIAGNOSIS — S20212A Contusion of left front wall of thorax, initial encounter: Secondary | ICD-10-CM | POA: Insufficient documentation

## 2014-10-21 DIAGNOSIS — W19XXXA Unspecified fall, initial encounter: Secondary | ICD-10-CM

## 2014-10-21 DIAGNOSIS — Z79899 Other long term (current) drug therapy: Secondary | ICD-10-CM | POA: Diagnosis not present

## 2014-10-21 DIAGNOSIS — R079 Chest pain, unspecified: Secondary | ICD-10-CM

## 2014-10-21 DIAGNOSIS — S299XXA Unspecified injury of thorax, initial encounter: Secondary | ICD-10-CM | POA: Diagnosis present

## 2014-10-21 NOTE — ED Notes (Signed)
Pt presents with left side rib pain after falling this past Thursday. Pt c/o left rib and wrist pain. Bruising noted to left wrist. Pt denies any loc at time of fall.

## 2014-10-21 NOTE — ED Provider Notes (Signed)
[facility] Emergency Department Provider Note  ____________________________________________  Time seen: Approximately 10:38 AM  I have reviewed the triage vital signs and the nursing notes.   HISTORY  Chief Complaint Rib Injury    HPI Lori Henson is a 81 y.o. female with hypertension, bradycardia status post pacemaker on 81 mg of aspirin daily but no other anticoagulants who presents for evaluation of sudden onset left chest pain after a mechanical  fall 2 days ago. Pain is described as dull and aching. Pain has been constant since onset. The patient reports that she was standing up and reaching for her walker which was just beyond her reach. She lost her balance and fell forward onto her chest. she did not hit her head or lose consciousness. Current severity of pain is moderate and it is worse with movement. No shortness of breath or pain with inspiration. No recent illness including cough, sneezing, runny nose, congestion, vomiting, diarrhea, fevers or chills.   Past Medical History  Diagnosis Date  . Malignant melanoma 2001    right leg, s/p radiation, recurrence 2008  . Hypertension   . 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 Lichen planus   . Angular cheilitis   . Osteoporosis     fosamax, compression fx s/p kyphoplasty    Patient Active Problem List   Diagnosis Date Noted  . Cough 10/15/2014  . Gastritis 06/12/2014  . Hair loss 06/12/2014  . Dysphagia 04/09/2014  . Abdominal pain 02/20/2014  . Wound of ankle 02/20/2014  . Hair thinning 11/11/2013  . Unsteady gait 11/11/2013  . Back pain 11/11/2013  . Squamous cell skin cancer 08/23/2013  . Restless leg syndrome 02/18/2013  . Failure to thrive 01/31/2013  . Depression 12/05/2012  . Hyponatremia 06/07/2012  . Lower extremity edema 05/11/2012  . Hypertension 02/07/2012  . Malignant melanoma 02/07/2012  . Chronic constipation 02/06/2012    Past Surgical History  Procedure Laterality Date   . Cholecystectomy  2009  . Tonsillectomy and adenoidectomy  1933  . Kyphoplasty  2007    L-5  . Bladder support/spark sling  2007    s/p anterior repair with pubovaginal sling and cystoscopy  . Pacemaker placement  2007  . Surgery for melanoma    . Tubal ligation    . Hemorroidectomy    . Hernia repair  1980  . Cataract surgery  1998    x2    Current Outpatient Rx  Name  Route  Sig  Dispense  Refill  . calcium carbonate (TUMS EX) 750 MG chewable tablet   Oral   Chew 1 tablet by mouth 2 (two) times daily.         Sarajane Marek Sodium 30-100 MG CAPS   Oral   Take by mouth daily.          . Cholecalciferol ([employer]S VITAMIN D3) 1000 UNITS capsule   Oral   Take 1,000 Units by mouth daily.         . clonazePAM (KLONOPIN) 0.5 MG tablet   Oral   Take 0.5 mg by mouth 2 (two) times daily as needed. Takes 1 1/2 tablet qhs         . lisinopril (PRINIVIL,ZESTRIL) 10 MG tablet   Oral   Take 1 tablet (10 mg total) by mouth 2 (two) times daily.   60 tablet   5   . metoprolol succinate (TOPROL-XL) 50 MG 24 hr tablet   Oral   Take 50 mg by  mouth daily.      0   . mirtazapine (REMERON) 30 MG tablet   Oral   Take 30 mg by mouth daily.      0   . Multiple Vitamins-Minerals (ICAPS) CAPS   Oral   Take by mouth 2 (two) times daily.          Marland Kitchen PARoxetine (PAXIL) 10 MG tablet            0   . Sorbitol SOLN      Does not apply route as needed   1000 mL   3     Allergies Macrobid  Family History  Problem Relation Age of Onset  . Heart disease Mother   . Heart disease Father     died age 45  . Stroke Sister   . Kidney disease Brother   . Uterine cancer Maternal Aunt   . Ovarian cancer      cousin    Social History History  Substance Use Topics  . Smoking status: Never Smoker   . Smokeless tobacco: Never Used  . Alcohol Use: No    Review of Systems Constitutional: No fever/chills Eyes: No visual changes. ENT: No sore  throat. Cardiovascular: + chest pain. Respiratory: Denies shortness of breath. Gastrointestinal: No abdominal pain.  No nausea, no vomiting.  No diarrhea.  No constipation. Genitourinary: Negative for dysuria. Musculoskeletal: Negative for back pain. Skin: Negative for rash. Neurological: Negative for headaches, focal weakness or numbness.  10-point ROS otherwise negative.  ____________________________________________   PHYSICAL EXAM:  VITAL SIGNS: ED Triage Vitals  Enc Vitals Group     BP 10/21/14 1017 136/84 mmHg     Pulse Rate 10/21/14 1017 86     Resp 10/21/14 1017 18     Temp 10/21/14 1017 97.7 F (36.5 C)     Temp Source 10/21/14 1017 Oral     SpO2 10/21/14 1017 97 %     Weight 10/21/14 1017 125 lb (56.7 kg)     Height 10/21/14 1017 5\' 6"  (1.676 m)     Head Cir --      Peak Flow --      Pain Score 10/21/14 1016 4     Pain Loc --      Pain Edu? --      Excl. in [place]? --     Constitutional: Alert and oriented. Well appearing and in no acute distress. Eyes: Conjunctivae are normal. PERRL. EOMI. Head: Atraumatic. Nose: No congestion/rhinnorhea. Mouth/Throat: Mucous membranes are moist.  Oropharynx non-erythematous. Neck: No stridor.   Cardiovascular: Normal rate, regular rhythm. Grossly normal heart sounds.  Good peripheral circulation. Respiratory: Normal respiratory effort.  No retractions. Lungs CTAB. Gastrointestinal: Soft and nontender. No distention. No abdominal bruits. No CVA tenderness. Genitourinary: deferred Musculoskeletal: Point tenderness to palpation in the left anterior and lateral chest wall, pain is reproducible with palpation. Large area of ecchymosis in the distal forearm not involving the wrist. No tenderness in the anatomic snuffbox. Neurologic:  Normal speech and language. No gross focal neurologic deficits are appreciated. No gait instability. Skin:  Skin is warm, dry and intact. No rash noted. Psychiatric: Mood and affect are normal. Speech  and behavior are normal.  ____________________________________________   LABS (all labs ordered are listed, but only abnormal results are displayed)  Labs Reviewed - No data to display ____________________________________________  EKG  None ____________________________________________  RADIOLOGY  CXR 2 view with rib views IMPRESSION: 1. No acute cardiopulmonary abnormalities. 2. Aortic atherosclerosis 3.  Osteopenia 4. Thoracic and lumbar compression fractures.   Left forearm xray IMPRESSION: No acute fracture. Postsurgical changes distal radius. Diffuse osteopenia. Soft tissue swelling adjacent to distal radius. There is about 4.5 mm separation between distal radius and ulna. Mild lateral distal ulnar subluxation cannot be excluded. ____________________________________________   PROCEDURES  Procedure(s) performed: None  Critical Care performed: No  ____________________________________________   INITIAL IMPRESSION / ASSESSMENT AND PLAN / ED COURSE  Pertinent labs & imaging results that were available during my care of the patient were reviewed by me and considered in my medical decision making (see chart for details).  Lori Henson is a 81 y.o. female with hypertension, bradycardia status post pacemaker on 81 mg of aspirin daily but no other anticoagulants who presents for evaluation of sudden onset left chest pain after a mechanical  fall 2 days ago. On exam, she is very well-appearing and in no acute distress. Vital signs stable. She is afebrile. She does have tenderness to palpation in the left anterior and lateral chest wall she does have large ecchymosis associated with the forearm. We'll obtain plain films of the chest, left forearm. She has declined any pain medications at this time stating her pain is well controlled.  ----------------------------------------- 12:20 PM on 10/21/2014 -----------------------------------------  Plain films of the chest  negative. Discussed plain film of the left forearm with Dr. Youlanda Mighty who is reviewed the film, reports likely chronic findings and as the chills or complain of any pain, we'll discharge with prn Ortho follow-up. Discussed return precautions, pain control with over-the-counter pain medications and the patient and husband are comfortable with the discharge plan. ____________________________________________   FINAL CLINICAL IMPRESSION(S) / ED DIAGNOSES  Final diagnoses:  Fall, initial encounter  Chest pain, unspecified chest pain type  Bruised rib, left, initial encounter      Joanne Gavel, MD 10/21/14 1221

## 2014-10-21 NOTE — ED Notes (Signed)
Pt ambulatory to bathroom assist x1 at this time, tolerating well, nad distress noted.

## 2014-10-24 NOTE — Telephone Encounter (Signed)
PT WAS CALLED . PT STATES SHE DOING BETTER. SHE WAS GIVEN THE ADVICE THAT DR. FAHEEM GAVE AND PT STATES THAT RIGHT NOW SHE DOING OK BUT THAT SHE WILL DISCUSS MORE WHEN SHE COMES IN NEXT MONTH.

## 2014-11-21 ENCOUNTER — Encounter: Payer: Self-pay | Admitting: Psychiatry

## 2014-11-21 ENCOUNTER — Ambulatory Visit (INDEPENDENT_AMBULATORY_CARE_PROVIDER_SITE_OTHER): Payer: Medicare Other | Admitting: Psychiatry

## 2014-11-21 VITALS — BP 132/74 | HR 82 | Temp 98.7°F | Ht 67.0 in | Wt 129.6 lb

## 2014-11-21 DIAGNOSIS — F330 Major depressive disorder, recurrent, mild: Secondary | ICD-10-CM

## 2014-11-21 MED ORDER — MIRTAZAPINE 30 MG PO TABS: 30.0000 mg | ORAL_TABLET | Freq: Every day | ORAL | Status: DC

## 2014-11-21 MED ORDER — CLONAZEPAM 0.5 MG PO TABS: 0.2500 mg | ORAL_TABLET | Freq: Two times a day (BID) | ORAL | Status: DC | PRN

## 2014-11-21 MED ORDER — PAROXETINE HCL 10 MG PO TABS: 10.0000 mg | ORAL_TABLET | Freq: Every day | ORAL | Status: DC

## 2014-11-21 NOTE — Progress Notes (Signed)
[facility] MD/PA/NP OP Progress Note  11/21/2014 3:06 PM Lori Henson  MRN:  [id number]  Subjective:    Patient is a 81 year old married female who presented for the follow-up appointment. She uses walker to ambulate. She reported that she lives with her 5 year old husband and has been doing well. [place]. She has been compliant with her medication and has gained some weight. She spends time at home and has been watching the Olympics  with her husband. She reported that her husband has been driving her around. They have good relationship. She does not have any adverse effects of medications. She takes Klonopin on a when necessary basis. She denied having any depressive symptoms. Her medications have been helping her and she takes them on a regular basis. She reported that she sleeps well at night and takes Remeron at night. She appeared calm and cooperative during the interview and does not exhibit any memory problems at this time.    Chief Complaint:  Chief Complaint    Follow-up; Medication Refill     Visit Diagnosis:     ICD-9-CM ICD-10-CM   1. MDD (major depressive disorder), recurrent episode, mild 296.31 F33.0     Past Medical History:  Past Medical History  Diagnosis Date  . Malignant melanoma 2001    right leg, s/p radiation, recurrence 2008  . Hypertension   . 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 Lichen planus   . Angular cheilitis   . Osteoporosis     fosamax, compression fx s/p kyphoplasty    Past Surgical History  Procedure Laterality Date  . Cholecystectomy  2009  . Tonsillectomy and adenoidectomy  1933  . Kyphoplasty  2007    L-5  . Bladder support/spark sling  2007    s/p anterior repair with pubovaginal sling and cystoscopy  . Pacemaker placement  2007  . Surgery for melanoma    . Tubal ligation    . Hemorroidectomy    . Hernia repair  1980  . Cataract surgery  1998    x2   Family History:  Family History  Problem  Relation Age of Onset  . Heart disease Mother   . Heart disease Father     died age 39  . Stroke Sister   . Kidney disease Brother   . Uterine cancer Maternal Aunt   . Ovarian cancer      cousin   Social History:  Social History   Social History  . Marital Status: Married    Spouse Name: N/A  . Number of Children: 4  . Years of Education: N/A   Social History Main Topics  . Smoking status: Never Smoker   . Smokeless tobacco: Never Used  . Alcohol Use: No  . Drug Use: No  . Sexual Activity: No   Other Topics Concern  . None   Social History Narrative   Additional History:  [facility].   Assessment:   Musculoskeletal: Strength & Muscle Tone: within normal limits Gait & Station: slow with walker Patient leans: N/A  Psychiatric Specialty Exam: HPI  Review of Systems  Psychiatric/Behavioral: Positive for depression. The patient is nervous/anxious.   All other systems reviewed and are negative.   Blood pressure 132/74, pulse 82, temperature 98.7 F (37.1 C), temperature source Tympanic, height 5\' 7"  (1.702 m), weight 129 lb 9.6 oz (58.786 kg), SpO2 93 %.Body mass index is 20.29 kg/(m^2).  General Appearance: Casual  Eye Contact:  Fair  Speech:  Clear and Coherent  Volume:  Decreased  Mood:  Anxious  Affect:  Congruent  Thought Process:  Coherent  Orientation:  Full (Time, Place, and Person)  Thought Content:  WDL  Suicidal Thoughts:  No  Homicidal Thoughts:  No  Memory:  Immediate;   Fair  Judgement:  Fair  Insight:  Fair  Psychomotor Activity:  Decreased  Concentration:  Fair  Recall:  [employer] of Knowledge: Fair  Language: Fair  Akathisia:  No  Handed:  Right  AIMS (if indicated):    Assets:  Communication Skills Social Support  ADL's:  Intact  Cognition: WNL  Sleep:     Is the patient at risk to self?  No. Has the patient been a risk to self in the past 6 months?  No. Has the patient been a risk to self within the distant  past?  No. Is the patient a risk to others?  No. Has the patient been a risk to others in the past 6 months?  No. Has the patient been a risk to others within the distant past?  No.  Current Medications: Current Outpatient Prescriptions  Medication Sig Dispense Refill  . calcium carbonate (TUMS EX) 750 MG chewable tablet Chew 1 tablet by mouth 2 (two) times daily.    Sarajane Marek Sodium 30-100 MG CAPS Take by mouth daily.     . Cholecalciferol ([employer]S VITAMIN D3) 1000 UNITS capsule Take 1,000 Units by mouth daily.    . clonazePAM (KLONOPIN) 0.5 MG tablet Take 0.5 mg by mouth 2 (two) times daily as needed. Takes 1 1/2 tablet qhs    . lisinopril (PRINIVIL,ZESTRIL) 10 MG tablet Take 1 tablet (10 mg total) by mouth 2 (two) times daily. 60 tablet 5  . metoprolol succinate (TOPROL-XL) 50 MG 24 hr tablet Take 50 mg by mouth daily.  0  . mirtazapine (REMERON) 30 MG tablet Take 30 mg by mouth daily.  0  . Multiple Vitamins-Minerals (ICAPS) CAPS Take by mouth 2 (two) times daily.     Marland Kitchen PARoxetine (PAXIL) 10 MG tablet   0  . Sorbitol SOLN Does not apply route as needed 1000 mL 3   No current facility-administered medications for this visit.    Medical Decision Making:  Established Problem, Stable/Improving (1) and Review of Psycho-Social Stressors (1)  Treatment Plan Summary:Medication management   Patient will continue on Remeron and Paxil and Klonopin on a when necessary basis. Medications refilled for the next 3 months. She will follow-up in 3 months or earlier depending on her symptoms.   Rainey Pines 11/21/2014, 3:06 PM

## 2015-01-12 ENCOUNTER — Ambulatory Visit (INDEPENDENT_AMBULATORY_CARE_PROVIDER_SITE_OTHER): Payer: Medicare Other | Admitting: Internal Medicine

## 2015-01-12 ENCOUNTER — Encounter: Payer: Self-pay | Admitting: Internal Medicine

## 2015-01-12 VITALS — BP 120/80 | HR 80 | Temp 97.9°F | Resp 18 | Ht 67.0 in | Wt 132.5 lb

## 2015-01-12 DIAGNOSIS — Z23 Encounter for immunization: Secondary | ICD-10-CM

## 2015-01-12 DIAGNOSIS — I10 Essential (primary) hypertension: Secondary | ICD-10-CM | POA: Diagnosis not present

## 2015-01-12 DIAGNOSIS — C439 Malignant melanoma of skin, unspecified: Secondary | ICD-10-CM

## 2015-01-12 DIAGNOSIS — K5900 Constipation, unspecified: Secondary | ICD-10-CM | POA: Diagnosis not present

## 2015-01-12 DIAGNOSIS — E871 Hypo-osmolality and hyponatremia: Secondary | ICD-10-CM

## 2015-01-12 DIAGNOSIS — F32A Depression, unspecified: Secondary | ICD-10-CM

## 2015-01-12 DIAGNOSIS — F329 Major depressive disorder, single episode, unspecified: Secondary | ICD-10-CM | POA: Diagnosis not present

## 2015-01-12 DIAGNOSIS — W19XXXD Unspecified fall, subsequent encounter: Secondary | ICD-10-CM

## 2015-01-12 DIAGNOSIS — K5909 Other constipation: Secondary | ICD-10-CM

## 2015-01-12 LAB — COMPREHENSIVE METABOLIC PANEL
ALK PHOS: 89 U/L (ref 39–117)
ALT: 14 U/L (ref 0–35)
AST: 20 U/L (ref 0–37)
Albumin: 3.6 g/dL (ref 3.5–5.2)
BILIRUBIN TOTAL: 0.3 mg/dL (ref 0.2–1.2)
BUN: 21 mg/dL (ref 6–23)
CALCIUM: 8.8 mg/dL (ref 8.4–10.5)
CO2: 33 meq/L — AB (ref 19–32)
Chloride: 97 mEq/L (ref 96–112)
Creatinine, Ser: 0.75 mg/dL (ref 0.40–1.20)
GFR: 76.65 mL/min (ref >60.00)
Glucose, Bld: 89 mg/dL (ref 70–99)
Potassium: 4.3 mEq/L (ref 3.5–5.1)
Sodium: 137 mEq/L (ref 135–145)
TOTAL PROTEIN: 6.8 g/dL (ref 6.0–8.3)

## 2015-01-12 NOTE — Progress Notes (Signed)
Patient ID: Lori Henson, female   DOB: [birth year], 81 y.o.   MRN: [id number]   Subjective:    Patient ID: Lori Henson, female    DOB: 1919-11-26, 81 y.o.   MRN: [id number]  HPI  Patient with past history of atrial flutter, hypertension and malignant melanoma.  She comes in today to follow up on these issues.  She is doing well.  Eating better.  Good appetite.  Gets around with her walker. No chest pain or tightness.  No sob.  Still issues with constipation.  No acid reflux.  No significant cough.  Seeing psychiatry.     Past Medical History  Diagnosis Date  . Malignant melanoma ([place]) 2001    right leg, s/p radiation, recurrence 2008  . Hypertension   . 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 Lichen planus   . Angular cheilitis   . Osteoporosis     fosamax, compression fx s/p kyphoplasty   Past Surgical History  Procedure Laterality Date  . Cholecystectomy  2009  . Tonsillectomy and adenoidectomy  1933  . Kyphoplasty  2007    L-5  . Bladder support/spark sling  2007    s/p anterior repair with pubovaginal sling and cystoscopy  . Pacemaker placement  2007  . Surgery for melanoma    . Tubal ligation    . Hemorroidectomy    . Hernia repair  1980  . Cataract surgery  1998    x2   Family History  Problem Relation Age of Onset  . Heart disease Mother   . Heart disease Father     died age 93  . Stroke Sister   . Kidney disease Brother   . Uterine cancer Maternal Aunt   . Ovarian cancer      cousin   Social History   Social History  . Marital Status: Married    Spouse Name: N/A  . Number of Children: 4  . Years of Education: N/A   Social History Main Topics  . Smoking status: Never Smoker   . Smokeless tobacco: Never Used  . Alcohol Use: No  . Drug Use: No  . Sexual Activity: No   Other Topics Concern  . None   Social History Narrative    Outpatient Encounter Prescriptions as of 01/12/2015  Medication Sig  . calcium carbonate (TUMS EX) 750 MG chewable tablet  Chew 1 tablet by mouth 2 (two) times daily.  Sarajane Marek Sodium 30-100 MG CAPS Take by mouth daily.   . Cholecalciferol ([employer] VITAMIN D3) 1000 UNITS capsule Take 1,000 Units by mouth daily.  . clonazePAM (KLONOPIN) 0.5 MG tablet Take 0.5 tablets (0.25 mg total) by mouth 2 (two) times daily as needed.  Marland Kitchen lisinopril (PRINIVIL,ZESTRIL) 10 MG tablet Take 1 tablet (10 mg total) by mouth 2 (two) times daily.  . metoprolol succinate (TOPROL-XL) 50 MG 24 hr tablet Take 50 mg by mouth daily.  . mirtazapine (REMERON) 30 MG tablet Take 1 tablet (30 mg total) by mouth daily.  . Multiple Vitamins-Minerals (ICAPS) CAPS Take by mouth 2 (two) times daily.   Marland Kitchen PARoxetine (PAXIL) 10 MG tablet Take 1 tablet (10 mg total) by mouth daily.  . Sorbitol SOLN Does not apply route as needed   No facility-administered encounter medications on file as of 01/12/2015.    Review of Systems  Constitutional: Negative for appetite change and unexpected weight change.  HENT: Negative for congestion and sinus pressure.   Eyes: Negative for pain and  visual disturbance.  Respiratory: Negative for cough, chest tightness and shortness of breath.   Cardiovascular: Negative for chest pain, palpitations and leg swelling.  Gastrointestinal: Negative for nausea, vomiting, abdominal pain and diarrhea.  Genitourinary: Negative for dysuria and difficulty urinating.  Musculoskeletal: Negative for back pain and joint swelling.  Skin: Negative for color change and rash.  Neurological: Negative for dizziness, light-headedness and headaches.  Psychiatric/Behavioral: Negative for dysphoric mood and agitation.       Objective:     Blood pressure rechecked by me:  130/64  Physical Exam  Constitutional: She appears well-developed and well-nourished. No distress.  HENT:  Nose: Nose normal.  Mouth/Throat: Oropharynx is clear and moist.  Eyes: Conjunctivae are normal. Right eye exhibits no discharge. Left eye exhibits no  discharge.  Neck: Neck supple. No thyromegaly present.  Cardiovascular: Normal rate and regular rhythm.   Pulmonary/Chest: Breath sounds normal. No respiratory distress. She has no wheezes.  Abdominal: Soft. Bowel sounds are normal. There is no tenderness.  Musculoskeletal: She exhibits no edema or tenderness.  Lymphadenopathy:    She has no cervical adenopathy.  Skin: No rash noted. No erythema.  Psychiatric: She has a normal mood and affect. Her behavior is normal.    BP 120/80 mmHg  Pulse 80  Temp(Src) 97.9 F (36.6 C) (Oral)  Resp 18  Ht 5\' 7"  (1.702 m)  Wt 132 lb 8 oz (60.102 kg)  BMI 20.75 kg/m2  SpO2 95% Wt Readings from Last 3 Encounters:  01/12/15 132 lb 8 oz (60.102 kg)  11/21/14 129 lb 9.6 oz (58.786 kg)  10/21/14 125 lb (56.7 kg)     Lab Results  Component Value Date   WBC 5.5 07/11/2014   HGB 12.9 07/11/2014   HCT 38.5 07/11/2014   PLT 211 07/11/2014   GLUCOSE 89 01/12/2015   ALT 14 01/12/2015   AST 20 01/12/2015   NA 137 01/12/2015   K 4.3 01/12/2015   CL 97 01/12/2015   CREATININE 0.75 01/12/2015   BUN 21 01/12/2015   CO2 33* 01/12/2015   TSH 1.67 06/08/2014    Dg Chest 2 View  10/21/2014   CLINICAL DATA:  Fall.  EXAM: CHEST  2 VIEW  COMPARISON:  02/06/2014  FINDINGS: There is a left chest wall pacer device with lead in the right atrial appendage and right ventricle. The heart size appears normal. No pleural effusion or edema identified. The lungs are hyperinflated but are clear. No airspace consolidation. The bones appear diffusely osteoporotic. There is a mild scoliosis deformity. Compression fractures are identified within the lower thoracic spine and lumbar spine.  IMPRESSION: 1. No acute cardiopulmonary abnormalities. 2. Aortic atherosclerosis 3. Osteopenia 4. Thoracic and lumbar compression fractures.   Electronically Signed   By: Kerby Moors M.D.   On: 10/21/2014 11:02   Dg Ribs Unilateral W/chest Left  10/21/2014   CLINICAL DATA:  Fall  Thursday night, pain  EXAM: LEFT RIBS AND CHEST - 3+ VIEW  COMPARISON:  Chest x-ray same day  FINDINGS: Two views left ribs submitted. No left rib fracture is identified. There is diffuse osteopenia. No pneumothorax.  IMPRESSION: Negative.  Diffuse osteopenia.   Electronically Signed   By: Lahoma Crocker M.D.   On: 10/21/2014 11:01   Dg Forearm Left  10/21/2014   CLINICAL DATA:  Fall Thursday night, left wrist pain  EXAM: LEFT FOREARM - 2 VIEW  COMPARISON:  05/29/2010  FINDINGS: Two views of left forearm submitted. There is diffuse osteopenia. Metallic fixation  plate and screws noted in distal radius. There is focal soft tissue swelling adjacent to distal radius. No acute fracture. There is about 4.5 mm separation between distal radius and ulna. Mild lateral distal ulnar subluxation cannot be excluded.  IMPRESSION: No acute fracture. Postsurgical changes distal radius. Diffuse osteopenia. Soft tissue swelling adjacent to distal radius. There is about 4.5 mm separation between distal radius and ulna. Mild lateral distal ulnar subluxation cannot be excluded.   Electronically Signed   By: Lahoma Crocker M.D.   On: 10/21/2014 11:05       Assessment & Plan:   Problem List Items Addressed This Visit    Chronic constipation    Taking lactulose.  Still some issues with her bowels.  Her current regimen is keeping things relatively stable.  Follow.       Cutaneous malignant melanoma (HCC)    Sees Dr Oliva Bustard.  Seeing Dr Kellie Moor.  Follow.       Depression    Seeing psychiatry.        Essential (primary) hypertension - Primary    Blood pressure under good control.  Continue same medication regimen.  Follow pressures.  Follow metabolic panel.        Relevant Orders   Comprehensive metabolic panel (Completed)   Fall    Had fall over the summer.  xrays as outlined.  No significant pain or problems now.  Using her walker to prevent falls.  Follow.        Hyponatremia    Recheck sodium.             Einar Pheasant, MD

## 2015-01-12 NOTE — Progress Notes (Signed)
Pre-visit discussion using our clinic review tool. No additional management support is needed unless otherwise documented below in the visit note.  

## 2015-01-13 ENCOUNTER — Encounter: Payer: Self-pay | Admitting: Internal Medicine

## 2015-01-13 DIAGNOSIS — W19XXXA Unspecified fall, initial encounter: Secondary | ICD-10-CM | POA: Insufficient documentation

## 2015-01-13 NOTE — Assessment & Plan Note (Signed)
Taking lactulose.  Still some issues with her bowels.  Her current regimen is keeping things relatively stable.  Follow.

## 2015-01-13 NOTE — Assessment & Plan Note (Signed)
Had fall over the summer.  xrays as outlined.  No significant pain or problems now.  Using her walker to prevent falls.  Follow.

## 2015-01-13 NOTE — Assessment & Plan Note (Signed)
Recheck sodium.   

## 2015-01-13 NOTE — Assessment & Plan Note (Signed)
Sees Dr Oliva Bustard.  Seeing Dr Kellie Moor.  Follow.

## 2015-01-13 NOTE — Assessment & Plan Note (Signed)
Blood pressure under good control.  Continue same medication regimen.  Follow pressures.  Follow metabolic panel.   

## 2015-01-13 NOTE — Assessment & Plan Note (Signed)
Seeing psychiatry

## 2015-01-16 NOTE — Telephone Encounter (Signed)
Unread mychart message mailed to patient 

## 2015-01-25 ENCOUNTER — Other Ambulatory Visit: Payer: Self-pay | Admitting: Psychiatry

## 2015-01-30 NOTE — Telephone Encounter (Signed)
Clonazepam 0.5 mg - pn #30 dispensed with 4 refills.

## 2015-02-20 ENCOUNTER — Ambulatory Visit (INDEPENDENT_AMBULATORY_CARE_PROVIDER_SITE_OTHER): Payer: Medicare Other | Admitting: Psychiatry

## 2015-02-20 ENCOUNTER — Encounter: Payer: Self-pay | Admitting: Psychiatry

## 2015-02-20 VITALS — BP 132/78 | HR 70 | Temp 97.9°F | Ht 65.5 in | Wt 133.4 lb

## 2015-02-20 DIAGNOSIS — F330 Major depressive disorder, recurrent, mild: Secondary | ICD-10-CM

## 2015-02-20 MED ORDER — MIRTAZAPINE 30 MG PO TABS: 30.0000 mg | ORAL_TABLET | Freq: Every day | ORAL | Status: DC

## 2015-02-20 MED ORDER — CLONAZEPAM 0.5 MG PO TABS: 0.2500 mg | ORAL_TABLET | Freq: Every day | ORAL | Status: DC

## 2015-02-20 MED ORDER — PAROXETINE HCL 10 MG PO TABS: 10.0000 mg | ORAL_TABLET | Freq: Every day | ORAL | Status: DC

## 2015-02-20 NOTE — Progress Notes (Signed)
[facility] MD/PA/NP OP Progress Note  02/20/2015 2:38 PM Lori Henson  MRN:  [id number]  Subjective:    Patient is a 81 year old married female who presented for the follow-up appointment. She uses walker to ambulate. She reported that she lives with her 41 year old husband and has been doing well. [place] live [place] the [place] community [place]. He stated that she has been compliant with her medications. She spends time playing piano. She reported that her son might be visiting them over the holidays. She does not have any adverse effects of her medications. She reported that she sleeps well with the help of Klonopin and Remeron. She stated that her mood is better and is not experiencing any depressive symptoms at this time. Denied having any suicidal homicidal ideations or plans.   Chief Complaint:  Chief Complaint    Medication Refill; Patient Education     Visit Diagnosis:     ICD-9-CM ICD-10-CM   1. MDD (major depressive disorder), recurrent episode, mild (HCC) 296.31 F33.0     Past Medical History:  Past Medical History  Diagnosis Date  . Malignant melanoma ([place]) 2001    right leg, s/p radiation, recurrence 2008  . Hypertension   . 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 Lichen planus   . Angular cheilitis   . Osteoporosis     fosamax, compression fx s/p kyphoplasty    Past Surgical History  Procedure Laterality Date  . Cholecystectomy  2009  . Tonsillectomy and adenoidectomy  1933  . Kyphoplasty  2007    L-5  . Bladder support/spark sling  2007    s/p anterior repair with pubovaginal sling and cystoscopy  . Pacemaker placement  2007  . Surgery for melanoma    . Tubal ligation    . Hemorroidectomy    . Hernia repair  1980  . Cataract surgery  1998    x2   Family History:  Family History  Problem Relation Age of Onset  . Heart disease Mother   . Heart disease Father     died age 55  . Stroke Sister   . Kidney disease Brother   . Uterine cancer Maternal Aunt   .  Ovarian cancer      cousin   Social History:  Social History   Social History  . Marital Status: Married    Spouse Name: N/A  . Number of Children: 4  . Years of Education: N/A   Social History Main Topics  . Smoking status: Never Smoker   . Smokeless tobacco: Never Used  . Alcohol Use: No  . Drug Use: No  . Sexual Activity: No   Other Topics Concern  . None   Social History Narrative   Additional History:  [facility].   Assessment:   Musculoskeletal: Strength & Muscle Tone: within normal limits Gait & Station: slow with walker Patient leans: N/A  Psychiatric Specialty Exam: HPI   Review of Systems  Psychiatric/Behavioral: Positive for depression. The patient is nervous/anxious.   All other systems reviewed and are negative.   Blood pressure 132/78, pulse 70, temperature 97.9 F (36.6 C), temperature source Tympanic, height 5' 5.5" (1.664 m), weight 133 lb 6.4 oz (60.51 kg), SpO2 96 %.Body mass index is 21.85 kg/(m^2).  General Appearance: Casual  Eye Contact:  Fair  Speech:  Clear and Coherent  Volume:  Decreased  Mood:  Anxious  Affect:  Congruent  Thought Process:  Coherent  Orientation:  Full (Time, Place,  and Person)  Thought Content:  WDL  Suicidal Thoughts:  No  Homicidal Thoughts:  No  Memory:  Immediate;   Fair  Judgement:  Fair  Insight:  Fair  Psychomotor Activity:  Decreased  Concentration:  Fair  Recall:  [employer] of Knowledge: Fair  Language: Fair  Akathisia:  No  Handed:  Right  AIMS (if indicated):    Assets:  Communication Skills Social Support  ADL's:  Intact  Cognition: WNL  Sleep:     Is the patient at risk to self?  No. Has the patient been a risk to self in the past 6 months?  No. Has the patient been a risk to self within the distant past?  No. Is the patient a risk to others?  No. Has the patient been a risk to others in the past 6 months?  No. Has the patient been a risk to others within the distant  past?  No.  Current Medications: Current Outpatient Prescriptions  Medication Sig Dispense Refill  . aspirin EC 81 MG tablet Take 81 mg by mouth daily.    . calcium carbonate (TUMS EX) 750 MG chewable tablet Chew 1 tablet by mouth 2 (two) times daily.    Sarajane Marek Sodium 30-100 MG CAPS Take by mouth daily.     . Cholecalciferol ([employer]S VITAMIN D3) 1000 UNITS capsule Take 1,000 Units by mouth daily.    . clonazePAM (KLONOPIN) 0.5 MG tablet take 1/2 tablet by mouth if needed for 30 DAYS 30 tablet 4  . lisinopril (PRINIVIL,ZESTRIL) 10 MG tablet Take 1 tablet (10 mg total) by mouth 2 (two) times daily. 60 tablet 5  . metoprolol succinate (TOPROL-XL) 50 MG 24 hr tablet Take 50 mg by mouth daily.  0  . mirtazapine (REMERON) 30 MG tablet Take 1 tablet (30 mg total) by mouth daily. 30 tablet 3  . Multiple Vitamins-Minerals (ICAPS) CAPS Take by mouth 2 (two) times daily.     Marland Kitchen PARoxetine (PAXIL) 10 MG tablet Take 1 tablet (10 mg total) by mouth daily. 30 tablet 3  . Sorbitol SOLN Does not apply route as needed 1000 mL 3   No current facility-administered medications for this visit.    Medical Decision Making:  Established Problem, Stable/Improving (1) and Review of Psycho-Social Stressors (1)  Treatment Plan Summary:Medication management   Depression  She will continue on Remeron 30 mg daily as well as Paxil 10 mg in the morning.  Anxiety She takes Klonopin in the morning  Follow-up 6 Months or earlier depending on her symptoms   More than 50% of the time spent in psychoeducation, counseling and coordination of care.    This note was generated in part or whole with voice recognition software. Voice regonition is usually quite accurate but there are transcription errors that can and very often do occur. I apologize for any typographical errors that were not detected and corrected.    Rainey Pines 02/20/2015, 2:38 PM

## 2015-03-05 ENCOUNTER — Ambulatory Visit
Admission: RE | Admit: 2015-03-05 | Discharge: 2015-03-05 | Disposition: A | Discharge status: Home / Self Care | Payer: Medicare Other | Source: Ambulatory Visit | Attending: Cardiology | Admitting: Cardiology

## 2015-03-05 DIAGNOSIS — Z950 Presence of cardiac pacemaker: Secondary | ICD-10-CM | POA: Insufficient documentation

## 2015-03-05 DIAGNOSIS — Z01810 Encounter for preprocedural cardiovascular examination: Secondary | ICD-10-CM | POA: Diagnosis present

## 2015-03-05 DIAGNOSIS — I10 Essential (primary) hypertension: Secondary | ICD-10-CM | POA: Insufficient documentation

## 2015-03-05 DIAGNOSIS — Z01812 Encounter for preprocedural laboratory examination: Secondary | ICD-10-CM | POA: Insufficient documentation

## 2015-03-05 DIAGNOSIS — M47817 Spondylosis without myelopathy or radiculopathy, lumbosacral region: Secondary | ICD-10-CM | POA: Diagnosis not present

## 2015-03-05 DIAGNOSIS — F419 Anxiety disorder, unspecified: Secondary | ICD-10-CM | POA: Insufficient documentation

## 2015-03-05 DIAGNOSIS — R001 Bradycardia, unspecified: Secondary | ICD-10-CM | POA: Diagnosis not present

## 2015-03-05 DIAGNOSIS — R05 Cough: Secondary | ICD-10-CM | POA: Insufficient documentation

## 2015-03-05 DIAGNOSIS — I4892 Unspecified atrial flutter: Secondary | ICD-10-CM

## 2015-03-05 DIAGNOSIS — Z01818 Encounter for other preprocedural examination: Secondary | ICD-10-CM | POA: Diagnosis not present

## 2015-03-05 DIAGNOSIS — R609 Edema, unspecified: Secondary | ICD-10-CM | POA: Diagnosis not present

## 2015-03-05 DIAGNOSIS — K219 Gastro-esophageal reflux disease without esophagitis: Secondary | ICD-10-CM | POA: Insufficient documentation

## 2015-03-05 DIAGNOSIS — C439 Malignant melanoma of skin, unspecified: Secondary | ICD-10-CM | POA: Diagnosis not present

## 2015-03-05 HISTORY — DX: Anxiety disorder, unspecified: F41.9

## 2015-03-05 HISTORY — DX: Cardiac arrhythmia, unspecified: I49.9

## 2015-03-05 HISTORY — DX: Gastro-esophageal reflux disease without esophagitis: K21.9

## 2015-03-05 HISTORY — DX: Unspecified atrial flutter: I48.92

## 2015-03-05 HISTORY — DX: Presence of cardiac pacemaker: Z95.0

## 2015-03-05 LAB — DIFFERENTIAL
BASOS PCT: 2 %
Basophils Absolute: 0.1 10*3/uL (ref 0–0.1)
EOS ABS: 0.4 10*3/uL (ref 0–0.7)
EOS PCT: 6 %
LYMPHS ABS: 1.2 10*3/uL (ref 1.0–3.6)
Lymphocytes Relative: 21 %
MONO ABS: 0.6 10*3/uL (ref 0.2–0.9)
Monocytes Relative: 10 %
Neutro Abs: 3.7 10*3/uL (ref 1.4–6.5)
Neutrophils Relative %: 61 %

## 2015-03-05 LAB — APTT: aPTT: 30 seconds (ref 24–36)

## 2015-03-05 LAB — CBC
HCT: 38.1 % (ref 35.0–47.0)
HEMOGLOBIN: 12.4 g/dL (ref 12.0–16.0)
MCH: 29.9 pg (ref 26.0–34.0)
MCHC: 32.5 g/dL (ref 32.0–36.0)
MCV: 91.9 fL (ref 80.0–100.0)
Platelets: 192 10*3/uL (ref 150–440)
RBC: 4.15 MIL/uL (ref 3.80–5.20)
RDW: 14.1 % (ref 11.5–14.5)
WBC: 6 10*3/uL (ref 3.6–11.0)

## 2015-03-05 LAB — BASIC METABOLIC PANEL
Anion gap: 5 (ref 5–15)
BUN: 21 mg/dL — AB (ref 6–20)
CALCIUM: 8.8 mg/dL — AB (ref 8.9–10.3)
CO2: 31 mmol/L (ref 22–32)
Chloride: 102 mmol/L (ref 101–111)
Creatinine, Ser: 0.8 mg/dL (ref 0.44–1.00)
GFR calc Af Amer: >60 mL/min (ref >60)
GLUCOSE: 69 mg/dL (ref 65–99)
Potassium: 4.6 mmol/L (ref 3.5–5.1)
Sodium: 138 mmol/L (ref 135–145)

## 2015-03-05 LAB — PROTIME-INR
INR: 1.01
Prothrombin Time: 13.5 seconds (ref 11.4–15.0)

## 2015-03-05 LAB — SURGICAL PCR SCREEN
MRSA, PCR: NEGATIVE
STAPHYLOCOCCUS AUREUS: NEGATIVE

## 2015-03-05 NOTE — Patient Instructions (Signed)
  Your procedure is scheduled on: March 07, 2015(Wednesday) Report to Day Surgery.[facility]) To find out your arrival time please call [phone] between 1PM - 3PM on March 06, 2015(Tuesday).  Remember: Instructions that are not followed completely may result in serious medical risk, up to and including death, or upon the discretion of your surgeon and anesthesiologist your surgery may need to be rescheduled.    __x__ 1. Do not eat food or drink liquids after midnight. No gum chewing or hard candies.     ____ 2. No Alcohol for 24 hours before or after surgery.   ____ 3. Bring all medications with you on the day of surgery if instructed.    __x__ 4. Notify your doctor if there is any change in your medical condition     (cold, fever, infections).     Do not wear jewelry, make-up, hairpins, clips or nail polish.  Do not wear lotions, powders, or perfumes. You may wear deodorant.  Do not shave 48 hours prior to surgery. Men may shave face and neck.  Do not bring valuables to the hospital.    [facility] is not responsible for any belongings or valuables.               Contacts, dentures or bridgework may not be worn into surgery.  Leave your suitcase in the car. After surgery it may be brought to your room.  For patients admitted to the hospital, discharge time is determined by your                treatment team.   Patients discharged the day of surgery will not be allowed to drive home.   Please read over the following fact sheets that you were given:   MRSA Information and Surgical Site Infection Prevention   __x__ Take these medicines the morning of surgery with A SIP OF WATER:    1. TAKE ALL MEDICATIONS AS INSTRUCTED BY YOUR HEART DOCTOR THE MORNING OF SURGERY  2.   3.   4.  5.  6.  ____ Fleet Enema (as directed)   __x__ Use CHG Soap as directed  ____ Use inhalers on the day of surgery  ____ Stop metformin 2 days prior to surgery    ____ Take 1/2 of usual  insulin dose the night before surgery and none on the morning of surgery.   ____ Stop Coumadin/Plavix/aspirin on '  ____ Stop Anti-inflammatories on    ____ Stop supplements until after surgery.    ____ Bring C-Pap to the hospital.

## 2015-03-07 ENCOUNTER — Ambulatory Visit
Admission: RE | Admit: 2015-03-07 | Discharge: 2015-03-07 | Disposition: A | Discharge status: Home / Self Care | Payer: Medicare Other | Source: Ambulatory Visit | Attending: Cardiology | Admitting: Cardiology

## 2015-03-07 ENCOUNTER — Ambulatory Visit: Payer: Medicare Other | Admitting: Certified Registered Nurse Anesthetist

## 2015-03-07 ENCOUNTER — Encounter: Payer: Self-pay | Admitting: *Deleted

## 2015-03-07 ENCOUNTER — Emergency Department
Admission: EM | Admit: 2015-03-07 | Discharge: 2015-03-08 | Disposition: A | Discharge status: Home / Self Care | Payer: Medicare Other | Attending: Student | Admitting: Student

## 2015-03-07 ENCOUNTER — Encounter
Admission: RE | Disposition: A | Discharge status: Home / Self Care | Payer: Self-pay | Source: Ambulatory Visit | Attending: Cardiology

## 2015-03-07 DIAGNOSIS — G8929 Other chronic pain: Secondary | ICD-10-CM | POA: Insufficient documentation

## 2015-03-07 DIAGNOSIS — R600 Localized edema: Secondary | ICD-10-CM | POA: Diagnosis not present

## 2015-03-07 DIAGNOSIS — L7622 Postprocedural hemorrhage and hematoma of skin and subcutaneous tissue following other procedure: Secondary | ICD-10-CM | POA: Diagnosis present

## 2015-03-07 DIAGNOSIS — Z79899 Other long term (current) drug therapy: Secondary | ICD-10-CM | POA: Diagnosis not present

## 2015-03-07 DIAGNOSIS — I495 Sick sinus syndrome: Secondary | ICD-10-CM | POA: Diagnosis present

## 2015-03-07 DIAGNOSIS — Z7982 Long term (current) use of aspirin: Secondary | ICD-10-CM | POA: Insufficient documentation

## 2015-03-07 DIAGNOSIS — I119 Hypertensive heart disease without heart failure: Secondary | ICD-10-CM | POA: Insufficient documentation

## 2015-03-07 DIAGNOSIS — Z45018 Encounter for adjustment and management of other part of cardiac pacemaker: Secondary | ICD-10-CM | POA: Diagnosis not present

## 2015-03-07 DIAGNOSIS — I480 Paroxysmal atrial fibrillation: Secondary | ICD-10-CM | POA: Diagnosis not present

## 2015-03-07 DIAGNOSIS — I10 Essential (primary) hypertension: Secondary | ICD-10-CM | POA: Diagnosis not present

## 2015-03-07 DIAGNOSIS — Z792 Long term (current) use of antibiotics: Secondary | ICD-10-CM | POA: Insufficient documentation

## 2015-03-07 DIAGNOSIS — I483 Typical atrial flutter: Secondary | ICD-10-CM | POA: Insufficient documentation

## 2015-03-07 DIAGNOSIS — Z85828 Personal history of other malignant neoplasm of skin: Secondary | ICD-10-CM | POA: Diagnosis not present

## 2015-03-07 DIAGNOSIS — R001 Bradycardia, unspecified: Secondary | ICD-10-CM | POA: Insufficient documentation

## 2015-03-07 DIAGNOSIS — Y658 Other specified misadventures during surgical and medical care: Secondary | ICD-10-CM | POA: Insufficient documentation

## 2015-03-07 HISTORY — PX: PACEMAKER INSERTION: SHX728

## 2015-03-07 SURGERY — INSERTION, CARDIAC PACEMAKER
Anesthesia: Monitor Anesthesia Care | Wound class: Clean

## 2015-03-07 MED ORDER — PROPOFOL 500 MG/50ML IV EMUL
INTRAVENOUS | Status: DC | PRN
  Administered 2015-03-07: 25 ug/kg/min via INTRAVENOUS

## 2015-03-07 MED ORDER — SODIUM CHLORIDE 0.9 % IR SOLN
Status: DC | PRN
  Administered 2015-03-07: 150 mL

## 2015-03-07 MED ORDER — LIDOCAINE HCL 1 % IJ SOLN
INTRAMUSCULAR | Status: DC | PRN
  Administered 2015-03-07: 30 mL

## 2015-03-07 MED ORDER — LACTATED RINGERS IV SOLN
INTRAVENOUS | Status: DC
  Administered 2015-03-07: 12:00:00 via INTRAVENOUS

## 2015-03-07 MED ORDER — FENTANYL CITRATE (PF) 100 MCG/2ML IJ SOLN
INTRAMUSCULAR | Status: DC | PRN
  Administered 2015-03-07 (×2): 25 ug via INTRAVENOUS

## 2015-03-07 MED ORDER — FENTANYL CITRATE (PF) 100 MCG/2ML IJ SOLN: 25.0000 ug | INTRAMUSCULAR | Status: DC | PRN

## 2015-03-07 MED ORDER — ACETAMINOPHEN 325 MG PO TABS: 325.0000 mg | ORAL_TABLET | ORAL | Status: DC | PRN

## 2015-03-07 MED ORDER — ONDANSETRON HCL 4 MG/2ML IJ SOLN: 4.0000 mg | Freq: Four times a day (QID) | INTRAMUSCULAR | Status: DC | PRN

## 2015-03-07 MED ORDER — CEPHALEXIN 250 MG PO CAPS: 250.0000 mg | ORAL_CAPSULE | Freq: Four times a day (QID) | ORAL | Status: DC

## 2015-03-07 MED ORDER — DIPHENHYDRAMINE HCL 50 MG/ML IJ SOLN
INTRAMUSCULAR | Status: DC | PRN
  Administered 2015-03-07: 25 mg via INTRAVENOUS

## 2015-03-07 MED ORDER — GENTAMICIN SULFATE 40 MG/ML IJ SOLN: Freq: Once | INTRAMUSCULAR | Status: DC

## 2015-03-07 MED ORDER — CEFAZOLIN SODIUM 1-5 GM-% IV SOLN
INTRAVENOUS | Status: AC
  Administered 2015-03-07: 1 g via INTRAVENOUS
  Filled 2015-03-07: qty 50

## 2015-03-07 MED ORDER — GENTAMICIN SULFATE 40 MG/ML IJ SOLN
INTRAMUSCULAR | Status: AC
  Filled 2015-03-07: qty 2

## 2015-03-07 MED ORDER — CEFAZOLIN SODIUM 1-5 GM-% IV SOLN
1.0000 g | Freq: Once | INTRAVENOUS | Status: AC
  Administered 2015-03-07: 1 g via INTRAVENOUS

## 2015-03-07 MED ORDER — ONDANSETRON HCL 4 MG/2ML IJ SOLN: 4.0000 mg | Freq: Once | INTRAMUSCULAR | Status: DC | PRN

## 2015-03-07 SURGICAL SUPPLY — 25 items
CABLE SURG 12 DISP A/V CHANNEL (MISCELLANEOUS) ×3 IMPLANT
CANISTER SUCT 1200ML W/VALVE (MISCELLANEOUS) ×3 IMPLANT
CHLORAPREP W/TINT 26ML (MISCELLANEOUS) ×3 IMPLANT
COVER LIGHT HANDLE STERIS (MISCELLANEOUS) ×6 IMPLANT
COVER MAYO STAND STRL (DRAPES) ×3 IMPLANT
DECANTER SPIKE VIAL GLASS SM (MISCELLANEOUS) ×3 IMPLANT
DEVICE DISSECT PLASMABLAD 3.0S (MISCELLANEOUS) ×1 IMPLANT
DRESSING TELFA 4X3 1S ST N-ADH (GAUZE/BANDAGES/DRESSINGS) ×3 IMPLANT
GLOVE BIO SURGEON STRL SZ7.5 (GLOVE) ×3 IMPLANT
GLOVE BIO SURGEON STRL SZ8 (GLOVE) ×3 IMPLANT
GOWN STRL REUS W/ TWL LRG LVL3 (GOWN DISPOSABLE) ×1 IMPLANT
GOWN STRL REUS W/ TWL XL LVL3 (GOWN DISPOSABLE) ×1 IMPLANT
GOWN STRL REUS W/TWL LRG LVL3 (GOWN DISPOSABLE) ×2
GOWN STRL REUS W/TWL XL LVL3 (GOWN DISPOSABLE) ×2
KIT RM TURNOVER STRD PROC AR (KITS) ×3 IMPLANT
LABEL OR SOLS (LABEL) ×3 IMPLANT
MARKER SKIN W/RULER 31145785 (MISCELLANEOUS) ×3 IMPLANT
NEEDLE FILTER BLUNT 18X 1/2SAF (NEEDLE) ×2
NEEDLE FILTER BLUNT 18X1 1/2 (NEEDLE) ×1 IMPLANT
NS IRRIG 500ML POUR BTL (IV SOLUTION) ×3 IMPLANT
PACK PACE INSERTION (MISCELLANEOUS) ×3 IMPLANT
PAD GROUND ADULT SPLIT (MISCELLANEOUS) ×3 IMPLANT
PAD STATPAD (MISCELLANEOUS) ×3 IMPLANT
PLASMABLADE 3.0S (MISCELLANEOUS) ×3
PPM ADVISA MRI DR A2DR01 (Pacemaker) ×3 IMPLANT

## 2015-03-07 NOTE — Op Note (Signed)
[facility] Cardiology   03/07/2015                     12:58 PM  PATIENT:  Lori Henson    PRE-OPERATIVE DIAGNOSIS:  sss/bradycardia  POST-OPERATIVE DIAGNOSIS:  Same  PROCEDURE:  / pacemaker change out  SURGEON:  Maurina Fawaz, MD    ANESTHESIA:     PREOPERATIVE INDICATIONS:  Lori Henson is a  81 y.o. female with a diagnosis of sss/bradycardia who failed conservative measures and elected for surgical management.    The risks benefits and alternatives were discussed with the patient preoperatively including but not limited to the risks of infection, bleeding, cardiopulmonary complications, the need for revision surgery, among others, and the patient was willing to proceed.   OPERATIVE PROCEDURE: Patient was brought to the operating room the fasting state. Pectoral region was prepped and draped in the usual sterile manner. Anesthesia was obtained 1% lidocaine locally. 6 cm incision was performed over the old pacemaker site. His make a generator was retrieved by electrocautery and blunt dissection. The electrodes were disconnected and connected to a new MRI compatible dual-chamber rate responsive pacemaker generator ( Advisa DR MRI [id number] ), and positioned into the pocket. The pocket was closed with 2-0 and 4-0 Vicryl, respectively. Steri-Strips and a pressure dressing were applied

## 2015-03-07 NOTE — Anesthesia Procedure Notes (Signed)
Procedure Name: MAC Performed by: Lason Eveland Pre-anesthesia Checklist: Patient identified, Emergency Drugs available, Suction available, Patient being monitored and Timeout performed Oxygen Delivery Method: Simple face mask       

## 2015-03-07 NOTE — Op Note (Signed)
Printout Information Document Contents Office Visit Document Received Date 03/07/2015 Document Source [place] Patient Demographics  Reason for Visit  Encounter Details  Last Filed Vital Signs (In this Visit)  Instructions  Progress Notes  Plan of Care  Visit Diagnoses  Document Information Encounter Summary - Lori Henson [id number] y.o. Female)As of Nov. 28, 2016 Patient Demographics Patient Address Communication Language Race / Ethnicity  [address] [place], [place] [address]  [phone] [facility]) [phone] (Mobile)  English (Preferred) Caucasian/White / Not Hispanic/Latino  Reason for Visit Reason Comments  Follow-up pacer ck-pacer ERI  Encounter Details Date Type Department Care Team Description  02/27/2015 Office Visit [facility]  [place], [place] [address]  [phone]  Isaias Cowman, [place]  [facility]  [place], [place] [address]  [phone]  [phone] (Fax)  Typical atrial flutter (Primary Dx);Essential hypertension;Junctional bradycardia;Pedal edema  Last Filed Vital Signs (In this Visit) Vital Sign Reading Time Taken  Blood Pressure 122/68 02/27/2015 2:57 PM EST  Pulse 66 02/27/2015 2:57 PM EST  Temperature - -  Respiratory Rate - -  Oxygen Saturation - -  Weight 59.9 kg (132 lb) 02/27/2015 2:57 PM EST  Height 167.6 cm (5\' 6" ) 02/27/2015 2:57 PM EST  Body Mass Index 21.31 02/27/2015 2:57 PM EST  Instructions Patient Instructions - Isaias Cowman, MD - 02/27/2015 3:00 PM EST DASH Eating Plan DASH stands for "Dietary Approaches to Stop Hypertension." The DASH eating plan is a healthy eating plan that has been shown to reduce high blood pressure (hypertension). Additional health benefits may include reducing the risk of type 2 diabetes mellitus, heart disease, and stroke. The DASH eating plan may also help with weight loss. WHAT DO I NEED TO KNOW  ABOUT THE DASH EATING PLAN? For the DASH eating plan, you will follow these general guidelines:  Choose foods with a percent daily value for sodium of less than 5% (as listed on the food label).  Use salt-free seasonings or herbs instead of table salt or sea salt.  Check with your health care provider or pharmacist before using salt substitutes.  Eat lower-sodium products, often labeled as "lower sodium" or "no salt added."  Eat fresh foods.  Eat more vegetables, fruits, and low-fat dairy products.  Choose whole grains. Look for the word "whole" as the first word in the ingredient list.  Choose fish and skinless chicken or [place] more often than red meat. Limit fish, poultry, and meat to 6 oz (170 g) each day.  Limit sweets, desserts, sugars, and sugary drinks.  Choose heart-healthy fats.  Limit cheese to 1 oz (28 g) per day.  Eat more home-cooked food and less restaurant, buffet, and fast food.  Limit fried foods.  Cook foods using methods other than frying.  Limit canned vegetables. If you do use them, rinse them well to decrease the sodium.  When eating at a restaurant, ask that your food be prepared with less salt, or no salt if possible. WHAT FOODS CAN I EAT? Seek help from a dietitian for individual calorie needs. Grains Whole grain or whole wheat bread. Brown rice. Whole grain or whole wheat pasta. Quinoa, bulgur, and whole grain cereals. Low-sodium cereals. Corn or whole wheat flour tortillas. Whole grain cornbread. Whole grain crackers. Low-sodium crackers. Vegetables Fresh or frozen vegetables (raw, steamed, roasted, or grilled). Low-sodium or reduced-sodium tomato and vegetable juices. Low-sodium or reduced-sodium tomato sauce and paste. Low-sodium or reduced-sodium canned vegetables.  Fruits  All fresh, canned (in natural juice), or frozen fruits. Meat and Other Protein Products Ground beef (85% or leaner), grass-fed beef, or beef trimmed of fat. Skinless chicken  or [place]. Ground chicken or [place]. Pork trimmed of fat. All fish and seafood. Eggs. Dried beans, peas, or lentils. Unsalted nuts and seeds. Unsalted canned beans. Dairy Low-fat dairy products, such as skim or 1% milk, 2% or reduced-fat cheeses, low-fat ricotta or cottage cheese, or plain low-fat yogurt. Low-sodium or reduced-sodium cheeses. Fats and Oils Tub margarines without trans fats. Light or reduced-fat mayonnaise and salad dressings (reduced sodium). Avocado. Safflower, olive, or canola oils. Natural peanut or almond butter. Other Unsalted popcorn and pretzels. The items listed above may not be a complete list of recommended foods or beverages. Contact your dietitian for more options. WHAT FOODS ARE NOT RECOMMENDED? Grains White bread. White pasta. White rice. Refined cornbread. Bagels and croissants. Crackers that contain trans fat. Vegetables Creamed or fried vegetables. Vegetables in a cheese sauce. Regular canned vegetables. Regular canned tomato sauce and paste. Regular tomato and vegetable juices. Fruits Dried fruits. Canned fruit in light or heavy syrup. Fruit juice. Meat and Other Protein Products Fatty cuts of meat. Ribs, chicken wings, bacon, sausage, bologna, salami, chitterlings, fatback, hot dogs, bratwurst, and packaged luncheon meats. Salted nuts and seeds. Canned beans with salt. Dairy Whole or 2% milk, cream, half-and-half, and cream cheese. Whole-fat or sweetened yogurt. Full-fat cheeses or blue cheese. Nondairy creamers and whipped toppings. Processed cheese, cheese spreads, or cheese curds. Condiments Onion and garlic salt, seasoned salt, table salt, and sea salt. Canned and packaged gravies. Worcestershire sauce. Tartar sauce. Barbecue sauce. Teriyaki sauce. Soy sauce, including reduced sodium. Steak sauce. Fish sauce. Oyster sauce. Cocktail sauce. Horseradish. Ketchup and mustard. Meat flavorings and tenderizers. Bouillon cubes. Hot sauce. Tabasco sauce. Marinades.  Taco seasonings. Relishes. Fats and Oils Butter, stick margarine, lard, shortening, ghee, and bacon fat. Coconut, palm kernel, or palm oils. Regular salad dressings. Other Pickles and olives. Salted popcorn and pretzels. The items listed above may not be a complete list of foods and beverages to avoid. Contact your dietitian for more information. WHERE CAN I FIND MORE INFORMATION? National Heart, Lung, and Blood Institute: travelstabloid.com  This information is not intended to replace advice given to you by your health care provider. Make sure you discuss any questions you have with your health care provider.  Document Released: 03/13/2011 Document Revised: 04/14/2014 Document Reviewed: 01/26/2013 Elsevier Interactive Patient Education 2016 [place] Notes Isaias Cowman, MD - 02/27/2015 3:00 PM EST Formatting of this note may be different from the original. Established Patient Visit   Chief Complaint: Chief Complaint  Patient presents with  . Follow-up  pacer ck-pacer ERI  Date of Service: 02/27/2015 Date of Birth: 13-Sep-1919 PCP: Alisa Graff, MD, MD  History of Present Illness: Ms. Holifield is a 81 y.o.female patient who returns for  1. Paroxysmal atrial fibrillation/atrial flutter 2. 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 Essential hypertension  Patient denies chest pain shortness of breath. She has not experienced any palpitations or heart racing. She does have peripheral edema, currently wears support hose. The patient is not active and does not exercise regularly. Pacemaker interrogation earlier today revealed elective replacement indication.  The patient has paroxysmal atrial fibrillation/atrial flutter, with chads Vasc score 4, currently taking low-dose aspirin. We discussed in detail the risks, benefits alternatives of chronic anticoagulation in the past, and have elected to stay on aspirin due  to increased  falling risk. The patient currently ambulates with a walker. She fell approximately 3 months ago and still is recovering from a hematoma on her left arm.  The patient has essential hypertension, blood pressure well controlled, currently on lisinopril, and metoprolol succinate which are well tolerated without apparent side effects. The patient follows a low-sodium, no added salt diet.  Past Medical and Surgical History  Past Medical History Past Medical History  Diagnosis Date  . Acute pancreatitis  r/t gallstones  . Angular cheilitis  . Atrial flutter  . Borderline hypertension  . Cataracts, bilateral  . Chronic constipation  followed by GI  . Chronic pain  . Edema  . H/O mammogram 01/26/2008,01/29/2009,02/06/2010,05/15/2011  . History of bone density study 07/08/2005,01/12/2008,01/29/2010  . Osteoporosis  a) Fosamax, b) compression fx s/p kyphoplasty  . Skin cancer 2001, 2008   Past Surgical History She has a past surgical history that includes Insert / replace / remove pacemaker; Colonoscopy (02/06/2004); egd (02/10/2014); Cataract extraction (Bilateral, 1998); HERNIA SURGERY (1980); PROLAPSED URETHRA; Laparoscopic tubal ligation; Cholecystectomy; and Kyphoplasty.   Medications and Allergies  Current Medications  Current Outpatient Prescriptions  Medication Sig Dispense Refill  . ANTIOX#10/OM3/DHA/EPA/LUT/ZEAX (I-CAPS ORAL) Take by mouth once daily.  Marland Kitchen aspirin 81 MG EC tablet Take 1 tablet (81 mg total) by mouth once daily. 30 tablet 11  . calcium carbonate (CALCIUM CARBONATE) 300 mg (750 mg) chewable tablet Take 600 mg of elemental by mouth once daily.  . cholecalciferol (VITAMIN D3) 1,000 unit capsule Take 1,000 Units by mouth once daily.  . clonazePAM (KLONOPIN) 0.5 MG tablet 0.5 mg 2 (two) times daily as needed. 0  . DOCUSATE SODIUM ORAL Take 1 capsule by mouth once daily as needed.  . fentaNYL (DURAGESIC) 25 mcg/hr patch Place 1 patch onto the skin every  third day. 10 patch 0  . levomefolate calcium (DEPLIN) 15 mg Tab Take 1 tablet by mouth once daily.  Marland Kitchen lisinopril (PRINIVIL,ZESTRIL) 10 MG tablet Take by mouth 2 (two) times daily.  Marland Kitchen MAGNESIUM ORAL Take by mouth once daily.  . metoprolol succinate (TOPROL-XL) 50 MG XL tablet take 1 tablet by mouth once daily 30 tablet 6  . mirtazapine (REMERON) 30 MG tablet Take 30 mg by mouth nightly. 0  . PARoxetine (PAXIL) 10 MG tablet Take 10 mg by mouth once daily. 0  . ranitidine (ZANTAC) 75 MG tablet Take 75 mg by mouth once daily.  . sorbitol 70 % solution 0  . triamcinolone 0.1 % cream 0   No current facility-administered medications for this visit.   Allergies: Macrobid [nitrofurantoin monohyd/m-cryst]  Social and Family History  Social History reports that she has never smoked. She has never used smokeless tobacco. She reports that she does not drink alcohol.  Family History Family History  Problem Relation Age of Onset  . Heart attack Father 23  . Kidney disease Brother  . Stroke Sister   Review of Systems   Review of Systems: The patient denies chest pain, shortness of breath, orthopnea, paroxysmal nocturnal dyspnea, reports chronic pedal edema, without palpitations, heart racing, presyncope, syncope. Review of 12 Systems is negative except as described above.  Physical Examination   Vitals: Visit Vitals  . BP 122/68 (BP Location: Left upper arm, Patient Position: Sitting, BP Cuff Size: Adult)  . Pulse 66  . Ht 167.6 cm (5\' 6" )  . Wt 59.9 kg (132 lb)  . BMI 21.31 kg/m2   Ht:167.6 cm (5\' 6" ) Wt:59.9 kg (132 lb) [id number] surface area is  1.67 meters squared. Body mass index is 21.31 kg/(m^2).  HEENT: Pupils equally reactive to light and accomodation  Neck: Supple without thyromegaly, carotid pulses 2+ Lungs: clear to auscultation bilaterally; no wheezes, rales, rhonchi Heart: Regular rate and rhythm. No gallops, murmurs or rub Abdomen: soft nontender, nondistended, with  normal bowel sounds Extremities: no cyanosis, clubbing, or edema Peripheral Pulses: 2+ in all extremities, 2+ femoral pulses bilaterally  Assessment   81 y.o. female with  1. Typical atrial flutter  2. Essential hypertension  3. Junctional bradycardia  4. Pedal edema   81 year old female with paroxysmal atrial fibrillation/atrial flutter, chads Vasc score 4, currently on low-dose aspirin, not on chronic anticoagulation, due to her advanced age, and increased falling risk. The patient has essential hypertension, blood pressure well controlled on current BP medications. Pacemaker interrogation today revealed elective replacement indication.  Plan   1. Continue current medications 2. Continue aspirin for stroke prevention 3. Counseled patient about low-sodium diet 4. DASH diet printed instructions given to the patient 5. Counseled patient about low-cholesterol diet  6. Low-fat and cholesterol diet printed instructions given to the patient 7. Pacemaker generator change out scheduled for 03/07/2015 8. Return to clinic for follow-up after pacemaker generator change  No orders of the defined types were placed in this encounter.  Return in about 3 weeks (around 03/20/2015), or after pacer change-out.  Isaias Cowman, MD    Plan of Care Upcoming Encounters Upcoming Encounters  Date Type Specialty Providers Description  07/18/2015 Appointment Cardiology Elasha Tess, Sheppard Coil, MD  [employer]  [facility]  [place], [place] [address]  [phone]  [phone] (Fax)    Visit Diagnoses   Typical atrial flutter - Primary   Essential hypertension   Junctional bradycardia Other specified cardiac dysrhythmias   Pedal edema Edema  Document Information Primary Care Provider Alisa Graff (Jun. 01, 2015 - Present) [phone] (Work) [phone] (Fax)  [id number] [place], [place] [address] Document Coverage  Dates Nov. 22, 2016 - Nov. 23, 2016 [employer] [phone] (Work) [place], [place] [address] Encounter Providers Isaias Cowman (Attending) [phone] (Work) [phone] (Fax)  [facility] [place], [place] [address] Encounter Date Nov. 22, 2016 - Nov. 23, 2016 Printout Information Document Contents Office Visit Document Received Date 03/07/2015 Document Source Organization [place] Patient Demographics  Reason for Visit  Encounter Details  Last Filed Vital Signs (In this Visit)  Instructions  Progress Notes  Plan of Care  Visit Diagnoses  Document Information Encounter Summary - Lori Henson [id number] y.o. Female)As of Nov. 28, 2016 Patient Demographics Patient Address Communication Language Race / Ethnicity  [address] [place], [place] [address]  [phone] [facility]) [phone] (Mobile)  English (Preferred) Caucasian/White / Not Hispanic/Latino  Reason for Visit Reason Comments  Follow-up pacer ck-pacer ERI  Encounter Details Date Type Department Care Team Description  02/27/2015 Office Visit [facility]  [place], [place] [address]  [phone]  Isaias Cowman, [place]  [facility]  [place], [place] [address]  [phone]  [phone] (Fax)  Typical atrial flutter (Primary Dx);Essential hypertension;Junctional bradycardia;Pedal edema  Last Filed Vital Signs (In this Visit) Vital Sign Reading Time Taken  Blood Pressure 122/68 02/27/2015 2:57 PM EST  Pulse 66 02/27/2015 2:57 PM EST  Temperature - -  Respiratory Rate - -  Oxygen Saturation - -  Weight 59.9 kg (132 lb) 02/27/2015 2:57 PM EST  Height 167.6 cm (  5\' 6" ) 02/27/2015 2:57 PM EST  Body Mass Index 21.31 02/27/2015 2:57 PM EST  Instructions Patient Instructions - Isaias Cowman, MD - 02/27/2015 3:00 PM EST DASH Eating Plan DASH  stands for "Dietary Approaches to Stop Hypertension." The DASH eating plan is a healthy eating plan that has been shown to reduce high blood pressure (hypertension). Additional health benefits may include reducing the risk of type 2 diabetes mellitus, heart disease, and stroke. The DASH eating plan may also help with weight loss. WHAT DO I NEED TO KNOW ABOUT THE DASH EATING PLAN? For the DASH eating plan, you will follow these general guidelines:  Choose foods with a percent daily value for sodium of less than 5% (as listed on the food label).  Use salt-free seasonings or herbs instead of table salt or sea salt.  Check with your health care provider or pharmacist before using salt substitutes.  Eat lower-sodium products, often labeled as "lower sodium" or "no salt added."  Eat fresh foods.  Eat more vegetables, fruits, and low-fat dairy products.  Choose whole grains. Look for the word "whole" as the first word in the ingredient list.  Choose fish and skinless chicken or [place] more often than red meat. Limit fish, poultry, and meat to 6 oz (170 g) each day.  Limit sweets, desserts, sugars, and sugary drinks.  Choose heart-healthy fats.  Limit cheese to 1 oz (28 g) per day.  Eat more home-cooked food and less restaurant, buffet, and fast food.  Limit fried foods.  Cook foods using methods other than frying.  Limit canned vegetables. If you do use them, rinse them well to decrease the sodium.  When eating at a restaurant, ask that your food be prepared with less salt, or no salt if possible. WHAT FOODS CAN I EAT? Seek help from a dietitian for individual calorie needs. Grains Whole grain or whole wheat bread. Brown rice. Whole grain or whole wheat pasta. Quinoa, bulgur, and whole grain cereals. Low-sodium cereals. Corn or whole wheat flour tortillas. Whole grain cornbread. Whole grain crackers. Low-sodium crackers. Vegetables Fresh or frozen vegetables (raw, steamed, roasted,  or grilled). Low-sodium or reduced-sodium tomato and vegetable juices. Low-sodium or reduced-sodium tomato sauce and paste. Low-sodium or reduced-sodium canned vegetables.  Fruits All fresh, canned (in natural juice), or frozen fruits. Meat and Other Protein Products Ground beef (85% or leaner), grass-fed beef, or beef trimmed of fat. Skinless chicken or [place]. Ground chicken or [place]. Pork trimmed of fat. All fish and seafood. Eggs. Dried beans, peas, or lentils. Unsalted nuts and seeds. Unsalted canned beans. Dairy Low-fat dairy products, such as skim or 1% milk, 2% or reduced-fat cheeses, low-fat ricotta or cottage cheese, or plain low-fat yogurt. Low-sodium or reduced-sodium cheeses. Fats and Oils Tub margarines without trans fats. Light or reduced-fat mayonnaise and salad dressings (reduced sodium). Avocado. Safflower, olive, or canola oils. Natural peanut or almond butter. Other Unsalted popcorn and pretzels. The items listed above may not be a complete list of recommended foods or beverages. Contact your dietitian for more options. WHAT FOODS ARE NOT RECOMMENDED? Grains White bread. White pasta. White rice. Refined cornbread. Bagels and croissants. Crackers that contain trans fat. Vegetables Creamed or fried vegetables. Vegetables in a cheese sauce. Regular canned vegetables. Regular canned tomato sauce and paste. Regular tomato and vegetable juices. Fruits Dried fruits. Canned fruit in light or heavy syrup. Fruit juice. Meat and Other Protein Products Fatty cuts of meat. Ribs, chicken wings, bacon, sausage, bologna, salami, chitterlings, fatback, hot  dogs, bratwurst, and packaged luncheon meats. Salted nuts and seeds. Canned beans with salt. Dairy Whole or 2% milk, cream, half-and-half, and cream cheese. Whole-fat or sweetened yogurt. Full-fat cheeses or blue cheese. Nondairy creamers and whipped toppings. Processed cheese, cheese spreads, or cheese curds. Condiments Onion and  garlic salt, seasoned salt, table salt, and sea salt. Canned and packaged gravies. Worcestershire sauce. Tartar sauce. Barbecue sauce. Teriyaki sauce. Soy sauce, including reduced sodium. Steak sauce. Fish sauce. Oyster sauce. Cocktail sauce. Horseradish. Ketchup and mustard. Meat flavorings and tenderizers. Bouillon cubes. Hot sauce. Tabasco sauce. Marinades. Taco seasonings. Relishes. Fats and Oils Butter, stick margarine, lard, shortening, ghee, and bacon fat. Coconut, palm kernel, or palm oils. Regular salad dressings. Other Pickles and olives. Salted popcorn and pretzels. The items listed above may not be a complete list of foods and beverages to avoid. Contact your dietitian for more information. WHERE CAN I FIND MORE INFORMATION? National Heart, Lung, and Blood Institute: travelstabloid.com  This information is not intended to replace advice given to you by your health care provider. Make sure you discuss any questions you have with your health care provider.  Document Released: 03/13/2011 Document Revised: 04/14/2014 Document Reviewed: 01/26/2013 Elsevier Interactive Patient Education 2016 [place] Notes Isaias Cowman, MD - 02/27/2015 3:00 PM EST Formatting of this note may be different from the original. Established Patient Visit   Chief Complaint: Chief Complaint  Patient presents with  . Follow-up  pacer ck-pacer ERI  Date of Service: 02/27/2015 Date of Birth: November 18, 1919 PCP: Alisa Graff, MD, MD  History of Present Illness: [name] is a 81 y.o.female patient who returns for  1. Paroxysmal atrial fibrillation/atrial flutter 2. 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 Essential hypertension  Patient denies chest pain shortness of breath. She has not experienced any palpitations or heart racing. She does have peripheral edema, currently wears support hose. The patient is not active and does  not exercise regularly. Pacemaker interrogation earlier today revealed elective replacement indication.  The patient has paroxysmal atrial fibrillation/atrial flutter, with chads Vasc score 4, currently taking low-dose aspirin. We discussed in detail the risks, benefits alternatives of chronic anticoagulation in the past, and have elected to stay on aspirin due to increased falling risk. The patient currently ambulates with a walker. She fell approximately 3 months ago and still is recovering from a hematoma on her left arm.  The patient has essential hypertension, blood pressure well controlled, currently on lisinopril, and metoprolol succinate which are well tolerated without apparent side effects. The patient follows a low-sodium, no added salt diet.  Past Medical and Surgical History  Past Medical History Past Medical History  Diagnosis Date  . Acute pancreatitis  r/t gallstones  . Angular cheilitis  . Atrial flutter  . Borderline hypertension  . Cataracts, bilateral  . Chronic constipation  followed by GI  . Chronic pain  . Edema  . H/O mammogram 01/26/2008,01/29/2009,02/06/2010,05/15/2011  . History of bone density study 07/08/2005,01/12/2008,01/29/2010  . Osteoporosis  a) Fosamax, b) compression fx s/p kyphoplasty  . Skin cancer 2001, 2008   Past Surgical History She has a past surgical history that includes Insert / replace / remove pacemaker; Colonoscopy (02/06/2004); egd (02/10/2014); Cataract extraction (Bilateral, 1998); HERNIA SURGERY (1980); PROLAPSED URETHRA; Laparoscopic tubal ligation; Cholecystectomy; and Kyphoplasty.   Medications and Allergies  Current Medications  Current Outpatient Prescriptions  Medication Sig Dispense Refill  . ANTIOX#10/OM3/DHA/EPA/LUT/ZEAX (I-CAPS ORAL) Take by mouth once daily.  Marland Kitchen aspirin 81 MG EC  tablet Take 1 tablet (81 mg total) by mouth once daily. 30 tablet 11  . calcium carbonate (CALCIUM CARBONATE) 300 mg (750 mg) chewable tablet  Take 600 mg of elemental by mouth once daily.  . cholecalciferol (VITAMIN D3) 1,000 unit capsule Take 1,000 Units by mouth once daily.  . clonazePAM (KLONOPIN) 0.5 MG tablet 0.5 mg 2 (two) times daily as needed. 0  . DOCUSATE SODIUM ORAL Take 1 capsule by mouth once daily as needed.  . fentaNYL (DURAGESIC) 25 mcg/hr patch Place 1 patch onto the skin every third day. 10 patch 0  . levomefolate calcium (DEPLIN) 15 mg Tab Take 1 tablet by mouth once daily.  Marland Kitchen lisinopril (PRINIVIL,ZESTRIL) 10 MG tablet Take by mouth 2 (two) times daily.  Marland Kitchen MAGNESIUM ORAL Take by mouth once daily.  . metoprolol succinate (TOPROL-XL) 50 MG XL tablet take 1 tablet by mouth once daily 30 tablet 6  . mirtazapine (REMERON) 30 MG tablet Take 30 mg by mouth nightly. 0  . PARoxetine (PAXIL) 10 MG tablet Take 10 mg by mouth once daily. 0  . ranitidine (ZANTAC) 75 MG tablet Take 75 mg by mouth once daily.  . sorbitol 70 % solution 0  . triamcinolone 0.1 % cream 0   No current facility-administered medications for this visit.   Allergies: Macrobid [nitrofurantoin monohyd/m-cryst]  Social and Family History  Social History reports that she has never smoked. She has never used smokeless tobacco. She reports that she does not drink alcohol.  Family History Family History  Problem Relation Age of Onset  . Heart attack Father 34  . Kidney disease Brother  . Stroke Sister   Review of Systems   Review of Systems: The patient denies chest pain, shortness of breath, orthopnea, paroxysmal nocturnal dyspnea, reports chronic pedal edema, without palpitations, heart racing, presyncope, syncope. Review of 12 Systems is negative except as described above.  Physical Examination   Vitals: Visit Vitals  . BP 122/68 (BP Location: Left upper arm, Patient Position: Sitting, BP Cuff Size: Adult)  . Pulse 66  . Ht 167.6 cm (5\' 6" )  . Wt 59.9 kg (132 lb)  . BMI 21.31 kg/m2   Ht:167.6 cm (5\' 6" ) Wt:59.9 kg (132 lb) [id number]  surface area is 1.67 meters squared. Body mass index is 21.31 kg/(m^2).  HEENT: Pupils equally reactive to light and accomodation  Neck: Supple without thyromegaly, carotid pulses 2+ Lungs: clear to auscultation bilaterally; no wheezes, rales, rhonchi Heart: Regular rate and rhythm. No gallops, murmurs or rub Abdomen: soft nontender, nondistended, with normal bowel sounds Extremities: no cyanosis, clubbing, or edema Peripheral Pulses: 2+ in all extremities, 2+ femoral pulses bilaterally  Assessment   81 y.o. female with  1. Typical atrial flutter  2. Essential hypertension  3. Junctional bradycardia  4. Pedal edema   81 year old female with paroxysmal atrial fibrillation/atrial flutter, chads Vasc score 4, currently on low-dose aspirin, not on chronic anticoagulation, due to her advanced age, and increased falling risk. The patient has essential hypertension, blood pressure well controlled on current BP medications. Pacemaker interrogation today revealed elective replacement indication.  Plan   1. Continue current medications 2. Continue aspirin for stroke prevention 3. Counseled patient about low-sodium diet 4. DASH diet printed instructions given to the patient 5. Counseled patient about low-cholesterol diet  6. Low-fat and cholesterol diet printed instructions given to the patient 7. Pacemaker generator change out scheduled for 03/07/2015 8. Return to clinic for follow-up after pacemaker generator change  No orders of  the defined types were placed in this encounter.  Return in about 3 weeks (around 03/20/2015), or after pacer change-out.  Isaias Cowman, MD    Plan of Care Upcoming Encounters Upcoming Encounters  Date Type Specialty Providers Description  07/18/2015 Appointment Cardiology [place] Gan[place], Sheppard Coil, MD  [place]  [facility]  [place], [place] [address]  [phone]  [phone] (Fax)    Visit Diagnoses   Typical  atrial flutter - Primary   Essential hypertension   Junctional bradycardia Other specified cardiac dysrhythmias   Pedal edema Edema  Document Information Primary Care Provider Alisa Graff (Jun. 01, 2015 - Present) [phone] (Work) [phone] (Fax)  [id number] [place], [place] [address] Document Coverage Dates Nov. 22, 2016 - Nov. 23, 2016 [place] [phone] (Work) [place], [place] [address] Encounter Providers Isaias Cowman (Attending) [phone] (Work) [phone] (Fax)  [facility] [place], [place] [address] Encounter Date Nov. 22, 2016 - Nov. 23, 2016

## 2015-03-07 NOTE — Anesthesia Preprocedure Evaluation (Signed)
Anesthesia Evaluation  Patient identified by MRN, date of birth, ID band Patient awake    Reviewed: Allergy & Precautions, H&P , NPO status , Patient's Chart, lab work & pertinent test results, reviewed documented beta blocker date and time   Airway Mallampati: II  TM Distance: >3 FB Neck ROM: full    Dental no notable dental hx. (+) Teeth Intact   Pulmonary neg pulmonary ROS,    Pulmonary exam normal breath sounds clear to auscultation       Cardiovascular Exercise Tolerance: Good hypertension, On Medications negative cardio ROS  Atrial Fibrillation + pacemaker  Rhythm:regular Rate:Normal     Neuro/Psych PSYCHIATRIC DISORDERS negative neurological ROS  negative psych ROS   GI/Hepatic negative GI ROS, Neg liver ROS, GERD  ,  Endo/Other  negative endocrine ROSdiabetes  Renal/GU      Musculoskeletal   Abdominal   Peds  Hematology negative hematology ROS (+)   Anesthesia Other Findings   Reproductive/Obstetrics negative OB ROS                             Anesthesia Physical Anesthesia Plan  ASA: III  Anesthesia Plan: MAC   Post-op Pain Management:    Induction:   Airway Management Planned:   Additional Equipment:   Intra-op Plan:   Post-operative Plan:   Informed Consent: I have reviewed the patients History and Physical, chart, labs and discussed the procedure including the risks, benefits and alternatives for the proposed anesthesia with the patient or authorized representative who has indicated his/her understanding and acceptance.     Plan Discussed with: CRNA  Anesthesia Plan Comments:         Anesthesia Quick Evaluation

## 2015-03-07 NOTE — Interval H&P Note (Signed)
History and Physical Interval Note:  03/07/2015 11:55 AM  Lori Henson  has presented today for surgery, with the diagnosis of sss/bradycardia  The various methods of treatment have been discussed with the patient and family. After consideration of risks, benefits and other options for treatment, the patient has consented to  Procedure(s): INSERTION PACEMAKER/ pacemaker change out (N/A) as a surgical intervention .  The patient's history has been reviewed, patient examined, no change in status, stable for surgery.  I have reviewed the patient's chart and labs.  Questions were answered to the patient's satisfaction.     Lori Henson

## 2015-03-07 NOTE — ED Notes (Signed)
Pt to triage via w/c with no distress noted; st pacemaker battery replaced today and noted bleeding from site; small amount blood noted to lower edge of 4x4 gauze on site

## 2015-03-07 NOTE — Discharge Instructions (Signed)
AMBULATORY SURGERY  °DISCHARGE INSTRUCTIONS ° ° °1) The drugs that you were given will stay in your system until tomorrow so for the next 24 hours you should not: ° °A) Drive an automobile °B) Make any legal decisions °C) Drink any alcoholic beverage ° ° °2) You may resume regular meals tomorrow.  Today it is better to start with liquids and gradually work up to solid foods. ° °You may eat anything you prefer, but it is better to start with liquids, then soup and crackers, and gradually work up to solid foods. ° ° °3) Please notify your doctor immediately if you have any unusual bleeding, trouble breathing, redness and pain at the surgery site, drainage, fever, or pain not relieved by medication. ° ° ° °4) Additional Instructions: ° ° ° ° ° ° ° °Please contact your physician with any problems or Same Day Surgery at [phone], Monday through Friday 6 am to 4 pm, or [place] [facility] [place] Main number at [phone].AMBULATORY SURGERY  °DISCHARGE INSTRUCTIONS ° ° °5) The drugs that you were given will stay in your system until tomorrow so for the next 24 hours you should not: ° °D) Drive an automobile °E) Make any legal decisions °F) Drink any alcoholic beverage ° ° °6) You may resume regular meals tomorrow.  Today it is better to start with liquids and gradually work up to solid foods. ° °You may eat anything you prefer, but it is better to start with liquids, then soup and crackers, and gradually work up to solid foods. ° ° °7) Please notify your doctor immediately if you have any unusual bleeding, trouble breathing, redness and pain at the surgery site, drainage, fever, or pain not relieved by medication. ° ° ° °8) Additional Instructions: ° ° ° ° ° ° ° °Please contact your physician with any problems or Same Day Surgery at [phone], Monday through Friday 6 am to 4 pm, or [place] [facility] [place] Main number at [phone]. °

## 2015-03-07 NOTE — H&P (Signed)
Printout Information Document Contents Office Visit Document Received Date 03/07/2015 Document Source [place] Patient Demographics  Reason for Visit  Encounter Details  Last Filed Vital Signs (In this Visit)  Instructions  Progress Notes  Plan of Care  Visit Diagnoses  Document Information Encounter Summary - Lori Henson [id number] y.o. Female)As of Nov. 28, 2016 Patient Demographics Patient Address Communication Language Race / Ethnicity  [address] [place], [place] [address]  [phone] [facility]) [phone] (Mobile)  English (Preferred) Caucasian/White / Not Hispanic/Latino  Reason for Visit Reason Comments  Follow-up pacer ck-pacer ERI  Encounter Details Date Type Department Care Team Description  02/27/2015 Office Visit [facility]  [place], [place] [address]  [phone]  Isaias Cowman, [place]  [facility]Cardiology  [place], [place] [address]  [phone]  [phone] (Fax)  Typical atrial flutter (Primary Dx);Essential hypertension;Junctional bradycardia;Pedal edema  Last Filed Vital Signs (In this Visit) Vital Sign Reading Time Taken  Blood Pressure 122/68 02/27/2015 2:57 PM EST  Pulse 66 02/27/2015 2:57 PM EST  Temperature - -  Respiratory Rate - -  Oxygen Saturation - -  Weight 59.9 kg (132 lb) 02/27/2015 2:57 PM EST  Height 167.6 cm (5\' 6" ) 02/27/2015 2:57 PM EST  Body Mass Index 21.31 02/27/2015 2:57 PM EST  Instructions Patient Instructions - Isaias Cowman, MD - 02/27/2015 3:00 PM EST DASH Eating Plan DASH stands for "Dietary Approaches to Stop Hypertension." The DASH eating plan is a healthy eating plan that has been shown to reduce high blood pressure (hypertension). Additional health benefits may include reducing the risk of type 2 diabetes mellitus, heart disease, and stroke. The DASH eating plan may also help with weight loss. WHAT DO I NEED TO KNOW  ABOUT THE DASH EATING PLAN? For the DASH eating plan, you will follow these general guidelines:  Choose foods with a percent daily value for sodium of less than 5% (as listed on the food label).  Use salt-free seasonings or herbs instead of table salt or sea salt.  Check with your health care provider or pharmacist before using salt substitutes.  Eat lower-sodium products, often labeled as "lower sodium" or "no salt added."  Eat fresh foods.  Eat more vegetables, fruits, and low-fat dairy products.  Choose whole grains. Look for the word "whole" as the first word in the ingredient list.  Choose fish and skinless chicken or [place] more often than red meat. Limit fish, poultry, and meat to 6 oz (170 g) each day.  Limit sweets, desserts, sugars, and sugary drinks.  Choose heart-healthy fats.  Limit cheese to 1 oz (28 g) per day.  Eat more home-cooked food and less restaurant, buffet, and fast food.  Limit fried foods.  Cook foods using methods other than frying.  Limit canned vegetables. If you do use them, rinse them well to decrease the sodium.  When eating at a restaurant, ask that your food be prepared with less salt, or no salt if possible. WHAT FOODS CAN I EAT? Seek help from a dietitian for individual calorie needs. Grains Whole grain or whole wheat bread. Brown rice. Whole grain or whole wheat pasta. Quinoa, bulgur, and whole grain cereals. Low-sodium cereals. Corn or whole wheat flour tortillas. Whole grain cornbread. Whole grain crackers. Low-sodium crackers. Vegetables Fresh or frozen vegetables (raw, steamed, roasted, or grilled). Low-sodium or reduced-sodium tomato and vegetable juices. Low-sodium or reduced-sodium tomato sauce and paste. Low-sodium or reduced-sodium canned vegetables.  Fruits  All fresh, canned (in natural juice), or frozen fruits. Meat and Other Protein Products Ground beef (85% or leaner), grass-fed beef, or beef trimmed of fat. Skinless chicken  or [place]. Ground chicken or [place]. Pork trimmed of fat. All fish and seafood. Eggs. Dried beans, peas, or lentils. Unsalted nuts and seeds. Unsalted canned beans. Dairy Low-fat dairy products, such as skim or 1% milk, 2% or reduced-fat cheeses, low-fat ricotta or cottage cheese, or plain low-fat yogurt. Low-sodium or reduced-sodium cheeses. Fats and Oils Tub margarines without trans fats. Light or reduced-fat mayonnaise and salad dressings (reduced sodium). Avocado. Safflower, olive, or canola oils. Natural peanut or almond butter. Other Unsalted popcorn and pretzels. The items listed above may not be a complete list of recommended foods or beverages. Contact your dietitian for more options. WHAT FOODS ARE NOT RECOMMENDED? Grains White bread. White pasta. White rice. Refined cornbread. Bagels and croissants. Crackers that contain trans fat. Vegetables Creamed or fried vegetables. Vegetables in a cheese sauce. Regular canned vegetables. Regular canned tomato sauce and paste. Regular tomato and vegetable juices. Fruits Dried fruits. Canned fruit in light or heavy syrup. Fruit juice. Meat and Other Protein Products Fatty cuts of meat. Ribs, chicken wings, bacon, sausage, bologna, salami, chitterlings, fatback, hot dogs, bratwurst, and packaged luncheon meats. Salted nuts and seeds. Canned beans with salt. Dairy Whole or 2% milk, cream, half-and-half, and cream cheese. Whole-fat or sweetened yogurt. Full-fat cheeses or blue cheese. Nondairy creamers and whipped toppings. Processed cheese, cheese spreads, or cheese curds. Condiments Onion and garlic salt, seasoned salt, table salt, and sea salt. Canned and packaged gravies. Worcestershire sauce. Tartar sauce. Barbecue sauce. Teriyaki sauce. Soy sauce, including reduced sodium. Steak sauce. Fish sauce. Oyster sauce. Cocktail sauce. Horseradish. Ketchup and mustard. Meat flavorings and tenderizers. Bouillon cubes. Hot sauce. Tabasco sauce. Marinades.  Taco seasonings. Relishes. Fats and Oils Butter, stick margarine, lard, shortening, ghee, and bacon fat. Coconut, palm kernel, or palm oils. Regular salad dressings. Other Pickles and olives. Salted popcorn and pretzels. The items listed above may not be a complete list of foods and beverages to avoid. Contact your dietitian for more information. WHERE CAN I FIND MORE INFORMATION? National Heart, Lung, and Blood Institute: travelstabloid.com  This information is not intended to replace advice given to you by your health care provider. Make sure you discuss any questions you have with your health care provider.  Document Released: 03/13/2011 Document Revised: 04/14/2014 Document Reviewed: 01/26/2013 Elsevier Interactive Patient Education 2016 [place] Notes Isaias Cowman, MD - 02/27/2015 3:00 PM EST Formatting of this note may be different from the original. Established Patient Visit   Chief Complaint: Chief Complaint  Patient presents with  . Follow-up  pacer ck-pacer ERI  Date of Service: 02/27/2015 Date of Birth: 1919-10-11 PCP: Alisa Graff, MD, MD  History of Present Illness: Ms. Leap is a 81 y.o.female patient who returns for  1. Paroxysmal atrial fibrillation/atrial flutter 2. 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 Essential hypertension  Patient denies chest pain shortness of breath. She has not experienced any palpitations or heart racing. She does have peripheral edema, currently wears support hose. The patient is not active and does not exercise regularly. Pacemaker interrogation earlier today revealed elective replacement indication.  The patient has paroxysmal atrial fibrillation/atrial flutter, with chads Vasc score 4, currently taking low-dose aspirin. We discussed in detail the risks, benefits alternatives of chronic anticoagulation in the past, and have elected to stay on aspirin due  to increased  falling risk. The patient currently ambulates with a walker. She fell approximately 3 months ago and still is recovering from a hematoma on her left arm.  The patient has essential hypertension, blood pressure well controlled, currently on lisinopril, and metoprolol succinate which are well tolerated without apparent side effects. The patient follows a low-sodium, no added salt diet.  Past Medical and Surgical History  Past Medical History Past Medical History  Diagnosis Date  . Acute pancreatitis  r/t gallstones  . Angular cheilitis  . Atrial flutter  . Borderline hypertension  . Cataracts, bilateral  . Chronic constipation  followed by GI  . Chronic pain  . Edema  . H/O mammogram 01/26/2008,01/29/2009,02/06/2010,05/15/2011  . History of bone density study 07/08/2005,01/12/2008,01/29/2010  . Osteoporosis  a) Fosamax, b) compression fx s/p kyphoplasty  . Skin cancer 2001, 2008   Past Surgical History She has a past surgical history that includes Insert / replace / remove pacemaker; Colonoscopy (02/06/2004); egd (02/10/2014); Cataract extraction (Bilateral, 1998); HERNIA SURGERY (1980); PROLAPSED URETHRA; Laparoscopic tubal ligation; Cholecystectomy; and Kyphoplasty.   Medications and Allergies  Current Medications  Current Outpatient Prescriptions  Medication Sig Dispense Refill  . ANTIOX#10/OM3/DHA/EPA/LUT/ZEAX (I-CAPS ORAL) Take by mouth once daily.  Marland Kitchen aspirin 81 MG EC tablet Take 1 tablet (81 mg total) by mouth once daily. 30 tablet 11  . calcium carbonate (CALCIUM CARBONATE) 300 mg (750 mg) chewable tablet Take 600 mg of elemental by mouth once daily.  . cholecalciferol (VITAMIN D3) 1,000 unit capsule Take 1,000 Units by mouth once daily.  . clonazePAM (KLONOPIN) 0.5 MG tablet 0.5 mg 2 (two) times daily as needed. 0  . DOCUSATE SODIUM ORAL Take 1 capsule by mouth once daily as needed.  . fentaNYL (DURAGESIC) 25 mcg/hr patch Place 1 patch onto the skin every  third day. 10 patch 0  . levomefolate calcium (DEPLIN) 15 mg Tab Take 1 tablet by mouth once daily.  Marland Kitchen lisinopril (PRINIVIL,ZESTRIL) 10 MG tablet Take by mouth 2 (two) times daily.  Marland Kitchen MAGNESIUM ORAL Take by mouth once daily.  . metoprolol succinate (TOPROL-XL) 50 MG XL tablet take 1 tablet by mouth once daily 30 tablet 6  . mirtazapine (REMERON) 30 MG tablet Take 30 mg by mouth nightly. 0  . PARoxetine (PAXIL) 10 MG tablet Take 10 mg by mouth once daily. 0  . ranitidine (ZANTAC) 75 MG tablet Take 75 mg by mouth once daily.  . sorbitol 70 % solution 0  . triamcinolone 0.1 % cream 0   No current facility-administered medications for this visit.   Allergies: Macrobid [nitrofurantoin monohyd/m-cryst]  Social and Family History  Social History reports that she has never smoked. She has never used smokeless tobacco. She reports that she does not drink alcohol.  Family History Family History  Problem Relation Age of Onset  . Heart attack Father 10  . Kidney disease Brother  . Stroke Sister   Review of Systems   Review of Systems: The patient denies chest pain, shortness of breath, orthopnea, paroxysmal nocturnal dyspnea, reports chronic pedal edema, without palpitations, heart racing, presyncope, syncope. Review of 12 Systems is negative except as described above.  Physical Examination   Vitals: Visit Vitals  . BP 122/68 (BP Location: Left upper arm, Patient Position: Sitting, BP Cuff Size: Adult)  . Pulse 66  . Ht 167.6 cm (5\' 6" )  . Wt 59.9 kg (132 lb)  . BMI 21.31 kg/m2   Ht:167.6 cm (5\' 6" ) Wt:59.9 kg (132 lb) [id number] surface area is  1.67 meters squared. Body mass index is 21.31 kg/(m^2).  HEENT: Pupils equally reactive to light and accomodation  Neck: Supple without thyromegaly, carotid pulses 2+ Lungs: clear to auscultation bilaterally; no wheezes, rales, rhonchi Heart: Regular rate and rhythm. No gallops, murmurs or rub Abdomen: soft nontender, nondistended, with  normal bowel sounds Extremities: no cyanosis, clubbing, or edema Peripheral Pulses: 2+ in all extremities, 2+ femoral pulses bilaterally  Assessment   81 y.o. female with  1. Typical atrial flutter  2. Essential hypertension  3. Junctional bradycardia  4. Pedal edema   81 year old female with paroxysmal atrial fibrillation/atrial flutter, chads Vasc score 4, currently on low-dose aspirin, not on chronic anticoagulation, due to her advanced age, and increased falling risk. The patient has essential hypertension, blood pressure well controlled on current BP medications. Pacemaker interrogation today revealed elective replacement indication.  Plan   1. Continue current medications 2. Continue aspirin for stroke prevention 3. Counseled patient about low-sodium diet 4. DASH diet printed instructions given to the patient 5. Counseled patient about low-cholesterol diet  6. Low-fat and cholesterol diet printed instructions given to the patient 7. Pacemaker generator change out scheduled for 03/07/2015 8. Return to clinic for follow-up after pacemaker generator change  No orders of the defined types were placed in this encounter.  Return in about 3 weeks (around 03/20/2015), or after pacer change-out.  Isaias Cowman, MD    Plan of Care Upcoming Encounters Upcoming Encounters  Date Type Specialty Providers Description  07/18/2015 Appointment Cardiology [place] Ma[place], Sheppard Coil, MD  [place]  [facility]  [place], [place] [address]  [phone]  [phone] (Fax)    Visit Diagnoses   Typical atrial flutter - Primary   Essential hypertension   Junctional bradycardia Other specified cardiac dysrhythmias   Pedal edema Edema  Document Information Primary Care Provider Alisa Graff (Jun. 01, 2015 - Present) [phone] (Work) [phone] (Fax)  [id number] [place], [place] [address] Document Coverage  Dates Nov. 22, 2016 - Nov. 23, 2016 [place] [phone] (Work) [place], [place] [address] Encounter Providers Isaias Cowman (Attending) [phone] (Work) [phone] (Fax)  [place] [facility], [place] [address] Encounter Date Nov. 22, 2016 - Nov. 23, 2016

## 2015-03-07 NOTE — ED Provider Notes (Signed)
[facility] Emergency Department Provider Note ____________________________________________  Time seen: Approximately 11:00 PM  I have reviewed the triage vital signs and the nursing notes.   HISTORY  Chief Complaint Post-op Problem   HPI Lori Henson is a 81 y.o. female who presents to the emergency department for evaluation of blood oozing from the surgical site after the battery was changed and her pacemaker today. She states that she started to take off her jacket and remove her T-shirt when she noticed that there was blood on her clothing. She denies pain or other complications from the procedure.   Past Medical History  Diagnosis Date  . Hypertension   . 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 Lichen planus   . Angular cheilitis   . Osteoporosis     fosamax, compression fx s/p kyphoplasty  . Dysrhythmia   . Presence of permanent cardiac pacemaker   . Anxiety   . GERD (gastroesophageal reflux disease)   . Atrial flutter ([place])   . Malignant melanoma ([place]) 2001    right leg, s/p radiation, recurrence 2008    Patient Active Problem List   Diagnosis Date Noted  . Fall 01/13/2015  . Cough 10/15/2014  . AV junctional bradycardia 07/07/2014  . Gastritis 06/12/2014  . Hair loss 06/12/2014  . Gastric catarrh 06/12/2014  . 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 Lumbar and sacral osteoarthritis 05/10/2014  . Dysphagia 04/09/2014  . Can't get food down 03/24/2014  . Abdominal pain 02/20/2014  . Wound of ankle 02/20/2014  . Open wound of knee, leg (except thigh), and ankle 02/20/2014  . Hair thinning 11/11/2013  . Unsteady gait 11/11/2013  . Back pain 11/11/2013  . Abnormal gait 11/11/2013  . Alopecia 11/11/2013  . Atrial flutter ([place]) 09/05/2013  . Chronic pain 09/05/2013  . Artificial cardiac pacemaker 09/05/2013  . Edema of foot 09/05/2013  . Prolapse of urethra 09/05/2013  . Squamous cell skin cancer  08/23/2013  . Cancer of skin, squamous cell 08/23/2013  . Restless leg syndrome 02/18/2013  . Restless leg 02/18/2013  . Failure to thrive 01/31/2013  . Neoplasm of uncertain behavior [id number]  . Depression 12/05/2012  . Depression, major, single episode 12/05/2012  . Major depressive disorder with single episode ([place]) 12/05/2012  . Hyponatremia 06/07/2012  . Hypo-osmolality and hyponatremia 06/07/2012  . Lower extremity edema 05/11/2012  . Accumulation of fluid in tissues 05/11/2012  . Hypertension 02/07/2012  . Malignant melanoma ([place]) 02/07/2012  . Essential (primary) hypertension 02/07/2012  . Cutaneous malignant melanoma ([place]) 02/07/2012  . Malignant melanoma of skin ([place]) 02/07/2012  . Chronic constipation 02/06/2012  . CN (constipation) 02/06/2012    Past Surgical History  Procedure Laterality Date  . Cholecystectomy  2009  . Tonsillectomy and adenoidectomy  1933  . Kyphoplasty  2007    L-5  . Bladder support/spark sling  2007    s/p anterior repair with pubovaginal sling and cystoscopy  . Pacemaker placement  2007  . Surgery for melanoma    . Tubal ligation    . Hemorroidectomy    . Hernia repair  1980  . Cataract surgery  1998    x2  . Tonsillectomy    . Back surgery    . Insert / replace / remove pacemaker    . Eye surgery Bilateral     Cataract Extraction  . Fracture surgery Left     fractured left wrist with plate inserted  . Pacemaker  insertion N/A 03/07/2015    Procedure: / pacemaker change out;  Surgeon: Isaias Cowman, MD;  Location: [facility] ORS;  Service: Cardiovascular;  Laterality: N/A;    Current Outpatient Rx  Name  Route  Sig  Dispense  Refill  . aspirin EC 81 MG tablet   Oral   Take 81 mg by mouth daily.         . calcium carbonate (TUMS EX) 750 MG chewable tablet   Oral   Chew 1 tablet by mouth 2 (two) times daily.         Sarajane Marek Sodium 30-100 MG CAPS   Oral   Take 1 capsule by mouth as needed.          .  cephALEXin (KEFLEX) 250 MG capsule   Oral   Take 1 capsule (250 mg total) by mouth 4 (four) times daily.   28 capsule   0   . Cholecalciferol (CVS VITAMIN D3) 1000 UNITS capsule   Oral   Take 1,000 Units by mouth daily.         . clonazePAM (KLONOPIN) 0.5 MG tablet   Oral   Take 0.5 tablets (0.25 mg total) by mouth daily.   45 tablet   4   . lisinopril (PRINIVIL,ZESTRIL) 10 MG tablet   Oral   Take 1 tablet (10 mg total) by mouth 2 (two) times daily.   60 tablet   5   . metoprolol succinate (TOPROL-XL) 50 MG 24 hr tablet   Oral   Take 50 mg by mouth daily.      0   . mirtazapine (REMERON) 30 MG tablet   Oral   Take 1 tablet (30 mg total) by mouth daily. Patient taking differently: Take 30 mg by mouth at bedtime.    90 tablet   3   . Multiple Vitamins-Minerals (ICAPS) CAPS   Oral   Take by mouth 2 (two) times daily.          Marland Kitchen PARoxetine (PAXIL) 10 MG tablet   Oral   Take 1 tablet (10 mg total) by mouth daily.   90 tablet   3   . Sorbitol SOLN      Does not apply route as needed Patient taking differently: Take by mouth as needed. Does not apply route as needed   1000 mL   3     Allergies Macrobid and Zantac  Family History  Problem Relation Age of Onset  . Heart disease Mother   . Heart disease Father     died age 54  . Stroke Sister   . Kidney disease Brother   . Uterine cancer Maternal Aunt   . Ovarian cancer      cousin    Social History Social History  Substance Use Topics  . Smoking status: Never Smoker   . Smokeless tobacco: Never Used  . Alcohol Use: No    Review of Systems Constitutional: No fever/chills Eyes: No visual changes. ENT: No sore throat. Cardiovascular: Denies chest pain. Respiratory: Denies shortness of breath. Gastrointestinal: No abdominal pain.  No nausea, no vomiting.  Musculoskeletal: Negative for back pain. Skin: Bleeding from pacemaker surgical site.. Neurological: Negative for headaches, focal  weakness or numbness. 10-point ROS otherwise negative.  ____________________________________________   PHYSICAL EXAM:  VITAL SIGNS: ED Triage Vitals  Enc Vitals Group     BP 03/07/15 2229 135/61 mmHg     Pulse Rate 03/07/15 2229 60     Resp 03/07/15 2229 20  Temp 03/07/15 2229 97.9 F (36.6 C)     Temp Source 03/07/15 2229 Oral     SpO2 03/07/15 2229 98 %     Weight 03/07/15 2229 133 lb (60.328 kg)     Height 03/07/15 2229 5\' 6"  (1.676 m)     Head Cir --      Peak Flow --      Pain Score --      Pain Loc --      Pain Edu? --      Excl. in [place]? --     Constitutional: Alert and oriented. Well appearing and in no acute distress. Eyes: Conjunctivae are normal. PERRL. EOMI. Head: Atraumatic. Nose: No congestion/rhinnorhea. Mouth/Throat: Mucous membranes are moist.  Neck: No stridor. Cardiovascular:Good peripheral circulation. Respiratory: Normal respiratory effort.  No retractions.  Musculoskeletal: Full ROM throughout. Neurologic:  Normal speech and language. No gross focal neurologic deficits are appreciated. Speech is normal. No gait instability. Skin: Steri-Strips are covering the pacemaker surgical site without bleeding at the time of exam.    Psychiatric: Mood and affect are normal. Speech and behavior are normal.  ____________________________________________   LABS (all labs ordered are listed, but only abnormal results are displayed)  Labs Reviewed - No data to display ____________________________________________  RADIOLOGY  Not indicated ____________________________________________   PROCEDURES  Procedure(s) performed: Surgicel was applied over the Steri-Strips then covered by 2 by twos and pressure dressing via Tegaderm. No bleeding on the 2 x 2's after monitoring the patient for one hour. ___________________________________________   INITIAL IMPRESSION / ASSESSMENT AND PLAN / ED COURSE  Pertinent labs & imaging results that were available during  my care of the patient were reviewed by me and considered in my medical decision making (see chart for details).  Patient has been more advised to call cardiology office tomorrow to advise him of tonight's visit. They were advised to leave the Surgicel and the dressing that was applied tonight in place until follow-up which is scheduled for one week. They were advised to remove the dressing only if it becomes wet or saturated and in that case reapply 2 x 2's and Tegaderm then report to primary care, cardiology, or back to the emergency department. ____________________________________________   FINAL CLINICAL IMPRESSION(S) / ED DIAGNOSES  Final diagnoses:  Postoperative hemorrhage of skin following non-dermatologic procedure       Victorino Dike, FNP 03/08/15 0023  Joanne Gavel, MD 03/08/15 [id number]

## 2015-03-07 NOTE — ED Notes (Signed)
Pt has bright red blood noted to dressing over pacemaker insertion site. Pt states she had battery changed today and has noticed the bleeding this evening. Bleeding controlled and dressing replaced with 4x4 and tegaderm.

## 2015-03-07 NOTE — ED Notes (Signed)
Patient presents to ED from home with c/o bleeding from newly placed pacemaker; placed today here.

## 2015-03-07 NOTE — Transfer of Care (Signed)
Immediate Anesthesia Transfer of Care Note  Patient: Lori Henson  Procedure(s) Performed: Procedure(s): / pacemaker change out (N/A)  Patient Location: PACU  Anesthesia Type:MAC  Level of Consciousness: awake, alert  and oriented  Airway & Oxygen Therapy: Patient Spontanous Breathing and Patient connected to face mask oxygen  Post-op Assessment: Report given to RN and Post -op Vital signs reviewed and stable  Post vital signs: Reviewed and stable  Last Vitals:  Filed Vitals:   03/07/15 1111  BP: 175/83  Pulse: 65  Temp: 36.3 C  Resp: 16    Complications: No apparent anesthesia complications

## 2015-03-08 NOTE — Discharge Instructions (Signed)
Please call to schedule your follow up and let the office know that you came to the emergency department tonight due to bleeding from the surgical site.  Leave the dressing in place until your follow up appointment unless it becomes saturated with water or blood.  If you need to remove the dressing, replace it with gauze and apply a new tegaderm.  Return to the Emergency Department if the bleeding continues and you are unable to see Dr. Saralyn Pilar.

## 2015-03-08 NOTE — ED Notes (Signed)
Pt and husband have been instructed on dressing care and follow up. Pt taken to car in wheelchair by RN and no acute distress noted. No blood on dressing at time of d/c.

## 2015-03-09 NOTE — Anesthesia Postprocedure Evaluation (Signed)
Anesthesia Post Note  Patient: Lori Henson  Procedure(s) Performed: Procedure(s) (LRB): / pacemaker change out (N/A)  Patient location during evaluation: PACU Anesthesia Type: MAC Level of consciousness: awake and alert Pain management: pain level controlled Vital Signs Assessment: post-procedure vital signs reviewed and stable Respiratory status: spontaneous breathing, nonlabored ventilation, respiratory function stable and patient connected to nasal cannula oxygen Cardiovascular status: stable and blood pressure returned to baseline Anesthetic complications: no    Last Vitals:  Filed Vitals:   03/07/15 1403 03/07/15 1448  BP: 169/74 176/66  Pulse: 60 61  Temp: 35.9 C   Resp: 16     Last Pain:  Filed Vitals:   03/07/15 1450  PainSc: 0-No pain                 Molli Barrows

## 2015-03-12 NOTE — Anesthesia Postprocedure Evaluation (Signed)
Anesthesia Post Note  Patient: Lori Henson  Procedure(s) Performed: Procedure(s) (LRB): / pacemaker change out (N/A)  Patient location during evaluation: PACU Anesthesia Type: General Level of consciousness: awake and alert Pain management: pain level controlled Vital Signs Assessment: post-procedure vital signs reviewed and stable Respiratory status: spontaneous breathing, nonlabored ventilation, respiratory function stable and patient connected to nasal cannula oxygen Cardiovascular status: blood pressure returned to baseline and stable Postop Assessment: no signs of nausea or vomiting Anesthetic complications: no    Last Vitals:  Filed Vitals:   03/07/15 1403 03/07/15 1448  BP: 169/74 176/66  Pulse: 60 61  Temp: 35.9 C   Resp: 16     Last Pain:  Filed Vitals:   03/07/15 1450  PainSc: 0-No pain                 Molli Barrows

## 2015-03-12 NOTE — Addendum Note (Signed)
Addendum  created 03/12/15 [id number] by Molli Barrows, MD   Modules edited: Clinical Notes   Clinical Notes:  File: [id number]

## 2015-04-04 ENCOUNTER — Encounter: Payer: Self-pay | Admitting: *Deleted

## 2015-04-08 ENCOUNTER — Other Ambulatory Visit: Payer: Self-pay | Admitting: Internal Medicine

## 2015-05-15 ENCOUNTER — Encounter: Payer: Self-pay | Admitting: Internal Medicine

## 2015-05-15 ENCOUNTER — Ambulatory Visit (INDEPENDENT_AMBULATORY_CARE_PROVIDER_SITE_OTHER): Payer: Medicare Other | Admitting: Internal Medicine

## 2015-05-15 VITALS — BP 124/70 | HR 71 | Temp 97.8°F | Resp 17 | Ht 65.5 in | Wt 137.2 lb

## 2015-05-15 DIAGNOSIS — C439 Malignant melanoma of skin, unspecified: Secondary | ICD-10-CM | POA: Diagnosis not present

## 2015-05-15 DIAGNOSIS — I4892 Unspecified atrial flutter: Secondary | ICD-10-CM

## 2015-05-15 DIAGNOSIS — K5900 Constipation, unspecified: Secondary | ICD-10-CM | POA: Diagnosis not present

## 2015-05-15 DIAGNOSIS — I10 Essential (primary) hypertension: Secondary | ICD-10-CM

## 2015-05-15 DIAGNOSIS — G2581 Restless legs syndrome: Secondary | ICD-10-CM

## 2015-05-15 DIAGNOSIS — K5909 Other constipation: Secondary | ICD-10-CM

## 2015-05-15 DIAGNOSIS — F329 Major depressive disorder, single episode, unspecified: Secondary | ICD-10-CM

## 2015-05-15 DIAGNOSIS — R600 Localized edema: Secondary | ICD-10-CM

## 2015-05-15 NOTE — Progress Notes (Signed)
Patient ID: Lori Henson, female   DOB: [birth year], 82 y.o.   MRN: [id number]   Subjective:    Patient ID: Lori Henson, female    DOB: [birth year], 82 y.o.   MRN: [id number]  HPI  Patient with past history of hypertension, bradycardia s/p pacemaker placement, osteoporosis, anxiety, atrial flutter and malignant melanoma.  She comes in today to follow up on these issues.  She is doing better.  Weight is up.  Eating better.  No chest pain or tightness.  No sob.  No abdominal pain or cramping.  Some constipation.  Has been an issue.  Ambulates with her walker.  Seeing psychiatry.  Doing better on current medication regimen.     Past Medical History  Diagnosis Date  . Hypertension   . 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 Lichen planus   . Angular cheilitis   . Osteoporosis     fosamax, compression fx s/p kyphoplasty  . Dysrhythmia   . Presence of permanent cardiac pacemaker   . Anxiety   . GERD (gastroesophageal reflux disease)   . Atrial flutter ([place])   . Malignant melanoma ([place]) 2001    right leg, s/p radiation, recurrence 2008   Past Surgical History  Procedure Laterality Date  . Cholecystectomy  2009  . Tonsillectomy and adenoidectomy  1933  . Kyphoplasty  2007    L-5  . Bladder support/spark sling  2007    s/p anterior repair with pubovaginal sling and cystoscopy  . Pacemaker placement  2007  . Surgery for melanoma    . Tubal ligation    . Hemorroidectomy    . Hernia repair  1980  . Cataract surgery  1998    x2  . Tonsillectomy    . Back surgery    . Insert / replace / remove pacemaker    . Eye surgery Bilateral     Cataract Extraction  . Fracture surgery Left     fractured left wrist with plate inserted  . Pacemaker insertion N/A 03/07/2015    Procedure: / pacemaker change out;  Surgeon: Isaias Cowman, MD;  Location: [facility] ORS;  Service: Cardiovascular;  Laterality: N/A;   Family History  Problem Relation Age of Onset  . Heart disease Mother   . Heart  disease Father     died age 45  . Stroke Sister   . Kidney disease Brother   . Uterine cancer Maternal Aunt   . Ovarian cancer      cousin   Social History   Social History  . Marital Status: Married    Spouse Name: N/A  . Number of Children: 4  . Years of Education: N/A   Social History Main Topics  . Smoking status: Never Smoker   . Smokeless tobacco: Never Used  . Alcohol Use: No  . Drug Use: No  . Sexual Activity: No   Other Topics Concern  . None   Social History Narrative    Outpatient Encounter Prescriptions as of 05/15/2015  Medication Sig  . aspirin EC 81 MG tablet Take 81 mg by mouth daily.  . calcium carbonate (TUMS EX) 750 MG chewable tablet Chew 1 tablet by mouth 2 (two) times daily.  Sarajane Marek Sodium 30-100 MG CAPS Take 1 capsule by mouth as needed.   . Cholecalciferol (CVS VITAMIN D3) 1000 UNITS capsule Take 1,000 Units by mouth daily.  . clonazePAM (KLONOPIN) 0.5 MG tablet Take 0.5 tablets (0.25 mg total) by mouth daily.  Marland Kitchen  lisinopril (PRINIVIL,ZESTRIL) 10 MG tablet take 1 tablet by mouth twice a day  . metoprolol succinate (TOPROL-XL) 50 MG 24 hr tablet Take 50 mg by mouth daily.  . mirtazapine (REMERON) 30 MG tablet Take 1 tablet (30 mg total) by mouth daily. (Patient taking differently: Take 30 mg by mouth at bedtime. )  . Multiple Vitamins-Minerals (ICAPS) CAPS Take by mouth 2 (two) times daily.   Marland Kitchen PARoxetine (PAXIL) 10 MG tablet Take 1 tablet (10 mg total) by mouth daily.  . Sorbitol SOLN Does not apply route as needed (Patient taking differently: Take by mouth as needed. Does not apply route as needed)  . [DISCONTINUED] cephALEXin (KEFLEX) 250 MG capsule Take 1 capsule (250 mg total) by mouth 4 (four) times daily.   No facility-administered encounter medications on file as of 05/15/2015.    Review of Systems  Constitutional: Negative for appetite change and unexpected weight change.  HENT: Negative for congestion and sinus pressure.     Eyes: Negative for discharge and redness.  Respiratory: Negative for cough, chest tightness and shortness of breath.   Cardiovascular: Negative for chest pain, palpitations and leg swelling.  Gastrointestinal: Positive for constipation. Negative for nausea, vomiting, abdominal pain and diarrhea.  Genitourinary: Negative for dysuria and difficulty urinating.  Musculoskeletal: Negative for back pain and joint swelling.  Neurological: Negative for dizziness, light-headedness and headaches.  Psychiatric/Behavioral: Negative for dysphoric mood and agitation.       Objective:    Physical Exam  Constitutional: She appears well-developed and well-nourished. No distress.  HENT:  Nose: Nose normal.  Mouth/Throat: Oropharynx is clear and moist.  Eyes: Conjunctivae are normal. Right eye exhibits no discharge. Left eye exhibits no discharge.  Neck: Neck supple. No thyromegaly present.  Cardiovascular: Normal rate and regular rhythm.   Pulmonary/Chest: Breath sounds normal. No respiratory distress. She has no wheezes.  Abdominal: Soft. Bowel sounds are normal. There is no tenderness.  Musculoskeletal: She exhibits no edema or tenderness.  Lymphadenopathy:    She has no cervical adenopathy.  Skin: No rash noted. No erythema.  Psychiatric: She has a normal mood and affect. Her behavior is normal.    BP 124/70 mmHg  Pulse 71  Temp(Src) 97.8 F (36.6 C) (Oral)  Resp 17  Ht 5' 5.5" (1.664 m)  Wt 137 lb 4 oz (62.256 kg)  BMI 22.48 kg/m2  SpO2 95% Wt Readings from Last 3 Encounters:  05/15/15 137 lb 4 oz (62.256 kg)  03/07/15 133 lb (60.328 kg)  03/05/15 133 lb (60.328 kg)     Lab Results  Component Value Date   WBC 6.0 03/05/2015   HGB 12.4 03/05/2015   HCT 38.1 03/05/2015   PLT 192 03/05/2015   GLUCOSE 69 03/05/2015   ALT 14 01/12/2015   AST 20 01/12/2015   NA 138 03/05/2015   K 4.6 03/05/2015   CL 102 03/05/2015   CREATININE 0.80 03/05/2015   BUN 21* 03/05/2015   CO2 31  03/05/2015   TSH 1.67 06/08/2014   INR 1.01 03/05/2015       Assessment & Plan:   Problem List Items Addressed This Visit    Atrial flutter ([place])    Documented history of aflutter.  Sees cardiology.  In SR.  Follow.        Chronic constipation    Takes lactulose.  Still some issues with her bowels.  Continue lactulose.  Follow.        Hypertension - Primary    Blood pressure under  good control.  Continue same medication regimen.  Follow pressures.  Follow metabolic panel.        Lower extremity edema    Compression hose.  Better.        Major depressive disorder with single episode ([place])    Followed by psychiatry.  Stable on current medication.  Follow.        Malignant melanoma of skin ([place])    Followed by dermatology.        Restless leg    Takes clonazepam.  Follow.            Einar Pheasant, MD

## 2015-05-15 NOTE — Progress Notes (Signed)
Pre-visit discussion using our clinic review tool. No additional management support is needed unless otherwise documented below in the visit note.  

## 2015-05-21 ENCOUNTER — Encounter: Payer: Self-pay | Admitting: Internal Medicine

## 2015-05-21 NOTE — Assessment & Plan Note (Signed)
Takes lactulose.  Still some issues with her bowels.  Continue lactulose.  Follow.

## 2015-05-21 NOTE — Assessment & Plan Note (Signed)
Followed by dermatology

## 2015-05-21 NOTE — Assessment & Plan Note (Signed)
Takes clonazepam.  Follow.

## 2015-05-21 NOTE — Assessment & Plan Note (Signed)
Compression hose.  Better.  

## 2015-05-21 NOTE — Assessment & Plan Note (Signed)
Followed by psychiatry.  Stable on current medication.  Follow.

## 2015-05-21 NOTE — Assessment & Plan Note (Signed)
Blood pressure under good control.  Continue same medication regimen.  Follow pressures.  Follow metabolic panel.   

## 2015-05-21 NOTE — Assessment & Plan Note (Signed)
Documented history of aflutter.  Sees cardiology.  In SR.  Follow.

## 2015-07-10 ENCOUNTER — Other Ambulatory Visit: Payer: Self-pay | Admitting: Internal Medicine

## 2015-07-11 NOTE — Telephone Encounter (Signed)
ok'd refill lactulose

## 2015-07-11 NOTE — Telephone Encounter (Signed)
Okay to refill? 

## 2015-07-19 ENCOUNTER — Telehealth: Payer: Self-pay | Admitting: Internal Medicine

## 2015-07-19 NOTE — Telephone Encounter (Signed)
Left msg to call office to schedule AWV with Denisa/msn °

## 2015-08-21 ENCOUNTER — Ambulatory Visit (INDEPENDENT_AMBULATORY_CARE_PROVIDER_SITE_OTHER): Payer: Medicare Other | Admitting: Psychiatry

## 2015-08-21 ENCOUNTER — Encounter: Payer: Self-pay | Admitting: Psychiatry

## 2015-08-21 VITALS — BP 122/68 | HR 69 | Temp 98.3°F | Ht 65.5 in | Wt 135.8 lb

## 2015-08-21 DIAGNOSIS — Z634 Disappearance and death of family member: Secondary | ICD-10-CM

## 2015-08-21 DIAGNOSIS — F331 Major depressive disorder, recurrent, moderate: Secondary | ICD-10-CM | POA: Diagnosis not present

## 2015-08-21 DIAGNOSIS — F4321 Adjustment disorder with depressed mood: Secondary | ICD-10-CM | POA: Diagnosis not present

## 2015-08-21 MED ORDER — MIRTAZAPINE 30 MG PO TABS: 30.0000 mg | ORAL_TABLET | Freq: Every day | ORAL | Status: DC

## 2015-08-21 MED ORDER — CLONAZEPAM 0.5 MG PO TABS: 0.2500 mg | ORAL_TABLET | Freq: Every day | ORAL | Status: DC | PRN

## 2015-08-21 MED ORDER — PAROXETINE HCL 10 MG PO TABS: 10.0000 mg | ORAL_TABLET | Freq: Every day | ORAL | Status: DC

## 2015-08-21 NOTE — Progress Notes (Signed)
BH MD/PA/NP OP Progress Note  08/21/2015 2:39 PM Lori Henson  MRN:  [id number]  Subjective:    Patient is a 82 year old married female who presented for the follow-up appointment. She uses walker to ambulate. She reported that her husband passed away on 2022/08/19 after he was diagnosed with lymphatic cancer 4 weeks ago. She reported that she was married for 54 years. She reported that he was sick for only for 4 weeks but he did not seek any treatment for his pancreatic cancer. She reported that he was followed by hospice for a short while. Her children came over  especially her daughter who was very supportive. Her daughter lives in [place]. Patient is currently living in independent living. She was brought in for the appointment by lady who has been helping her with her daily chores. Patient reported that she has been compliant with her medication. She feels tired in the morning and will take naps during the daytime. Patient currently denied having any side effects of the medication. She takes Paxil in the morning. We discussed about her medications in detail and advised her to take Klonopin on a when necessary basis.  She demonstrated understanding. She reported that she has been writing thank you cards to the people who came to the funeral. She likes playing piano. She denied worsening of her depressive symptoms at this time. She reported that her mood is fine and she is not having suicidal homicidal ideations or plans. She has good support system.  Chief Complaint:  Chief Complaint    Follow-up; Medication Refill     Visit Diagnosis:     ICD-9-CM ICD-10-CM   1. MDD (major depressive disorder), recurrent episode, moderate (HCC) 296.32 F33.1   2. Bereavement due to life event 309.0 F43.21    V62.89 Z63.4     Past Medical History:  Past Medical History  Diagnosis Date  . Hypertension   . 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 Lichen planus   . Angular cheilitis   . Osteoporosis     fosamax,  compression fx s/p kyphoplasty  . Dysrhythmia   . Presence of permanent cardiac pacemaker   . Anxiety   . GERD (gastroesophageal reflux disease)   . Atrial flutter ([place])   . Malignant melanoma ([place]) 2001    right leg, s/p radiation, recurrence 2008    Past Surgical History  Procedure Laterality Date  . Cholecystectomy  2009  . Tonsillectomy and adenoidectomy  1933  . Kyphoplasty  2007    L-5  . Bladder support/spark sling  2007    s/p anterior repair with pubovaginal sling and cystoscopy  . Pacemaker placement  2007  . Surgery for melanoma    . Tubal ligation    . Hemorroidectomy    . Hernia repair  1980  . Cataract surgery  1998    x2  . Tonsillectomy    . Back surgery    . Insert / replace / remove pacemaker    . Eye surgery Bilateral     Cataract Extraction  . Fracture surgery Left     fractured left wrist with plate inserted  . Pacemaker insertion N/A 03/07/2015    Procedure: / pacemaker change out;  Surgeon: Isaias Cowman, MD;  Location: [facility] ORS;  Service: Cardiovascular;  Laterality: N/A;   Family History:  Family History  Problem Relation Age of Onset  . Heart disease Mother   . Heart disease Father     died age 41  .  Stroke Sister   . Kidney disease Brother   . Uterine cancer Maternal Aunt   . Ovarian cancer      cousin   Social History:  Social History   Social History  . Marital Status: Married    Spouse Name: N/A  . Number of Children: 4  . Years of Education: N/A   Social History Main Topics  . Smoking status: Never Smoker   . Smokeless tobacco: Never Used  . Alcohol Use: No  . Drug Use: No  . Sexual Activity: No   Other Topics Concern  . None   Social History Narrative   Additional History:  Lives in retirement center.   Assessment:   Musculoskeletal: Strength & Muscle Tone: within normal limits Gait & Station: slow with walker Patient leans: N/A  Psychiatric Specialty Exam: HPI  Review of Systems   Psychiatric/Behavioral: Positive for depression. The patient is nervous/anxious.   All other systems reviewed and are negative.   Blood pressure 122/68, pulse 69, temperature 98.3 F (36.8 C), temperature source Tympanic, height 5' 5.5" (1.664 m), weight 135 lb 12.8 oz (61.598 kg), SpO2 93 %.Body mass index is 22.25 kg/(m^2).  General Appearance: Casual  Eye Contact:  Fair  Speech:  Clear and Coherent  Volume:  Decreased  Mood:  Anxious  Affect:  Congruent  Thought Process:  Coherent  Orientation:  Full (Time, Place, and Person)  Thought Content:  WDL  Suicidal Thoughts:  No  Homicidal Thoughts:  No  Memory:  Immediate;   Fair  Judgement:  Fair  Insight:  Fair  Psychomotor Activity:  Decreased  Concentration:  Fair  Recall:  [employer] of Knowledge: Fair  Language: Fair  Akathisia:  No  Handed:  Right  AIMS (if indicated):    Assets:  Communication Skills Social Support  ADL's:  Intact  Cognition: WNL  Sleep:     Is the patient at risk to self?  No. Has the patient been a risk to self in the past 6 months?  No. Has the patient been a risk to self within the distant past?  No. Is the patient a risk to others?  No. Has the patient been a risk to others in the past 6 months?  No. Has the patient been a risk to others within the distant past?  No.  Current Medications: Current Outpatient Prescriptions  Medication Sig Dispense Refill  . aspirin EC 81 MG tablet Take 81 mg by mouth daily.    . calcium carbonate (TUMS EX) 750 MG chewable tablet Chew 1 tablet by mouth 2 (two) times daily.    Sarajane Marek Sodium 30-100 MG CAPS Take 1 capsule by mouth as needed.     . Cholecalciferol ([employer]S VITAMIN D3) 1000 UNITS capsule Take 1,000 Units by mouth daily.    . clonazePAM (KLONOPIN) 0.5 MG tablet Take 0.5 tablets (0.25 mg total) by mouth daily. 45 tablet 4  . lisinopril (PRINIVIL,ZESTRIL) 10 MG tablet take 1 tablet by mouth twice a day 60 tablet 5  . metoprolol succinate  (TOPROL-XL) 50 MG 24 hr tablet Take 50 mg by mouth daily.  0  . mirtazapine (REMERON) 30 MG tablet Take 1 tablet (30 mg total) by mouth daily. (Patient taking differently: Take 30 mg by mouth at bedtime. ) 90 tablet 3  . Multiple Vitamins-Minerals (ICAPS) CAPS Take by mouth 2 (two) times daily.     Marland Kitchen PARoxetine (PAXIL) 10 MG tablet Take 1 tablet (10 mg total) by mouth  daily. 90 tablet 3  . sorbitol 70 % SOLN use if needed 1000 mL 3   No current facility-administered medications for this visit.    Medical Decision Making:  Established Problem, Stable/Improving (1) and Review of Psycho-Social Stressors (1)  Treatment Plan Summary:Medication management   Depression  She will continue on Remeron 30 mg daily as well as Paxil 10 mg qhs   Anxiety She takes Klonopin  Prn and I will dispense only 15 pills on a when necessary basis.  Follow-up 2 Months or earlier depending on her symptoms   More than 50% of the time spent in psychoeducation, counseling and coordination of care.    This note was generated in part or whole with voice recognition software. Voice regonition is usually quite accurate but there are transcription errors that can and very often do occur. I apologize for any typographical errors that were not detected and corrected.    Rainey Pines, MD  08/21/2015, 2:39 PM

## 2015-09-02 ENCOUNTER — Other Ambulatory Visit: Payer: Self-pay | Admitting: Psychiatry

## 2015-09-18 ENCOUNTER — Ambulatory Visit (INDEPENDENT_AMBULATORY_CARE_PROVIDER_SITE_OTHER): Payer: Medicare Other | Admitting: Internal Medicine

## 2015-09-18 ENCOUNTER — Encounter: Payer: Self-pay | Admitting: Internal Medicine

## 2015-09-18 VITALS — BP 112/60 | HR 69 | Temp 97.9°F | Resp 18 | Ht 65.5 in | Wt 135.1 lb

## 2015-09-18 DIAGNOSIS — K5900 Constipation, unspecified: Secondary | ICD-10-CM | POA: Diagnosis not present

## 2015-09-18 DIAGNOSIS — I10 Essential (primary) hypertension: Secondary | ICD-10-CM

## 2015-09-18 DIAGNOSIS — F320 Major depressive disorder, single episode, mild: Secondary | ICD-10-CM

## 2015-09-18 DIAGNOSIS — R600 Localized edema: Secondary | ICD-10-CM

## 2015-09-18 DIAGNOSIS — F32A Depression, unspecified: Secondary | ICD-10-CM

## 2015-09-18 DIAGNOSIS — C439 Malignant melanoma of skin, unspecified: Secondary | ICD-10-CM | POA: Diagnosis not present

## 2015-09-18 DIAGNOSIS — I4892 Unspecified atrial flutter: Secondary | ICD-10-CM

## 2015-09-18 DIAGNOSIS — K5909 Other constipation: Secondary | ICD-10-CM

## 2015-09-18 DIAGNOSIS — F329 Major depressive disorder, single episode, unspecified: Secondary | ICD-10-CM

## 2015-09-18 DIAGNOSIS — K625 Hemorrhage of anus and rectum: Secondary | ICD-10-CM | POA: Diagnosis not present

## 2015-09-18 NOTE — Progress Notes (Signed)
Patient ID: Lori Henson, female   DOB: 20-Dec-1919, 82 y.o.   MRN: [id number]   Subjective:    Patient ID: Lori Henson, female    DOB: [birth year], 82 y.o.   MRN: [id number]  HPI  Patient here for a scheduled follow up.  She reports some left lower back pain.  Notices after lying for a while.  Once up and moving this resolved.  No pain radiating down leg.  No numbness or tingling.  No chest pain.  She is eating.  Breathing stable.  She reports persistent problems with constipation.  Has been a chronic issue.  Not taking her lactulose regularly.  Will start.  States has noticed with increased straining with bowel movement, will notice rectal pain with bowel movement and BRB with wiping.  No blood in the stool.  No vaginal discharge.  She also reports pulled a scab off her ankle.  No infection.     Past Medical History  Diagnosis Date  . Hypertension   . 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 Lichen planus   . Angular cheilitis   . Osteoporosis     fosamax, compression fx s/p kyphoplasty  . Dysrhythmia   . Presence of permanent cardiac pacemaker   . Anxiety   . GERD (gastroesophageal reflux disease)   . Atrial flutter ([place])   . Malignant melanoma ([place]) 2001    right leg, s/p radiation, recurrence 2008   Past Surgical History  Procedure Laterality Date  . Cholecystectomy  2009  . Tonsillectomy and adenoidectomy  1933  . Kyphoplasty  2007    L-5  . Bladder support/spark sling  2007    s/p anterior repair with pubovaginal sling and cystoscopy  . Pacemaker placement  2007  . Surgery for melanoma    . Tubal ligation    . Hemorroidectomy    . Hernia repair  1980  . Cataract surgery  1998    x2  . Tonsillectomy    . Back surgery    . Insert / replace / remove pacemaker    . Eye surgery Bilateral     Cataract Extraction  . Fracture surgery Left     fractured left wrist with plate inserted  . Pacemaker insertion N/A 03/07/2015    Procedure: / pacemaker change out;  Surgeon:  Isaias Cowman, MD;  Location: [facility] ORS;  Service: Cardiovascular;  Laterality: N/A;   Family History  Problem Relation Age of Onset  . Heart disease Mother   . Heart disease Father     died age 86  . Stroke Sister   . Kidney disease Brother   . Uterine cancer Maternal Aunt   . Ovarian cancer      cousin   Social History   Social History  . Marital Status: Married    Spouse Name: N/A  . Number of Children: 4  . Years of Education: N/A   Social History Main Topics  . Smoking status: Never Smoker   . Smokeless tobacco: Never Used  . Alcohol Use: No  . Drug Use: No  . Sexual Activity: No   Other Topics Concern  . None   Social History Narrative    Outpatient Encounter Prescriptions as of 09/18/2015  Medication Sig  . aspirin EC 81 MG tablet Take 81 mg by mouth daily.  . calcium carbonate (TUMS EX) 750 MG chewable tablet Chew 1 tablet by mouth 2 (two) times daily.  Sarajane Marek Sodium 30-100 MG CAPS Take 1  capsule by mouth as needed.   . Cholecalciferol (CVS VITAMIN D3) 1000 UNITS capsule Take 1,000 Units by mouth daily.  . clonazePAM (KLONOPIN) 0.5 MG tablet Take 0.5 tablets (0.25 mg total) by mouth daily as needed for anxiety.  Marland Kitchen lisinopril (PRINIVIL,ZESTRIL) 10 MG tablet take 1 tablet by mouth twice a day  . metoprolol succinate (TOPROL-XL) 50 MG 24 hr tablet Take 50 mg by mouth daily.  . mirtazapine (REMERON) 30 MG tablet Take 1 tablet (30 mg total) by mouth at bedtime.  . Multiple Vitamins-Minerals (ICAPS) CAPS Take by mouth 2 (two) times daily.   Marland Kitchen PARoxetine (PAXIL) 10 MG tablet Take 1 tablet (10 mg total) by mouth daily.  . sorbitol 70 % SOLN use if needed   No facility-administered encounter medications on file as of 09/18/2015.    Review of Systems  Constitutional: Negative for appetite change and unexpected weight change.  HENT: Negative for congestion and sinus pressure.   Respiratory: Negative for cough, chest tightness and shortness of  breath.   Cardiovascular: Negative for chest pain, palpitations and leg swelling.  Gastrointestinal: Positive for constipation. Negative for nausea, vomiting, abdominal pain and diarrhea.       BRBPR as outlined.   Genitourinary: Negative for dysuria and difficulty urinating.  Musculoskeletal: Positive for back pain. Negative for joint swelling.  Skin:       Lesion - right ankle.  No evidence of infection.  Stasis changes present.    Neurological: Negative for dizziness, light-headedness and headaches.  Psychiatric/Behavioral: Negative for dysphoric mood and agitation.       Handling stress and appears to be coping well with her husband's death.        Objective:    Physical Exam  Constitutional: She appears well-developed and well-nourished. No distress.  HENT:  Nose: Nose normal.  Mouth/Throat: Oropharynx is clear and moist.  Neck: Neck supple. No thyromegaly present.  Cardiovascular: Normal rate and regular rhythm.   Pulmonary/Chest: Breath sounds normal. No respiratory distress. She has no wheezes.  Abdominal: Soft. Bowel sounds are normal. There is no tenderness.  Genitourinary:  GU exam - no vaginal lesions noted.  Rectal exam - heme negative.  No visible hemorrhoid.    Musculoskeletal: She exhibits no edema or tenderness.  Venostasis changes present - lesion present.  No evidence of infection.    Lymphadenopathy:    She has no cervical adenopathy.  Skin: Skin is warm and dry.  Psychiatric: She has a normal mood and affect. Her behavior is normal.    BP 112/60 mmHg  Pulse 69  Temp(Src) 97.9 F (36.6 C) (Oral)  Resp 18  Ht 5' 5.5" (1.664 m)  Wt 135 lb 2 oz (61.292 kg)  BMI 22.14 kg/m2  SpO2 96% Wt Readings from Last 3 Encounters:  09/18/15 135 lb 2 oz (61.292 kg)  08/21/15 135 lb 12.8 oz (61.598 kg)  05/15/15 137 lb 4 oz (62.256 kg)     Lab Results  Component Value Date   WBC 6.0 09/18/2015   HGB 12.8 09/18/2015   HCT 38.2 09/18/2015   PLT 239.0 09/18/2015    GLUCOSE 82 09/18/2015   ALT 13 09/18/2015   AST 20 09/18/2015   NA 133* 09/18/2015   K 4.6 09/18/2015   CL 99 09/18/2015   CREATININE 0.90 09/18/2015   BUN 20 09/18/2015   CO2 29 09/18/2015   TSH 2.47 09/18/2015   INR 1.01 03/05/2015       Assessment & Plan:   Problem  List Items Addressed This Visit    Atrial flutter [facility])    Sees cardiology.  In SR today.  Has been stable.  Follow.       Chronic constipation    Has been a chronic issue.  Discussed with her today.  Will restart lactulose on a regular basis.  Discussed her rectal bleeding.  She declines any further evaluation or w/up.  annusol HC suppositories.  Check cbc.        Relevant Orders   TSH (Completed)   Depression    Seeing psychiatry.        Depression, major, single episode    Seeing psychiatry.  Doing well on current regimen.        Hypertension    Blood pressure under good control.  Continue same medication regimen.  Follow pressures.  Follow metabolic panel.        Relevant Orders   Comprehensive metabolic panel (Completed)   Lower extremity edema    No significant swelling on exam.  Lesion noted.  No infection.  Follow.  Notify me if does not heal.        Malignant melanoma ([place])    Continue to f/u with dermatology.       Rectal bleeding - Primary    Heme negative on exam.  She relates to straining.  Treat constipation as outlined.  She declines any further evaluation for the bleeding.  annusol HC suppositories.  Check cbc.        Relevant Orders   CBC with Differential/Platelet (Completed)     I spent 40 minutes with the patient and more than 50% of the time was spent in consultation regarding the above.     Einar Pheasant, MD

## 2015-09-18 NOTE — Progress Notes (Signed)
Pre-visit discussion using our clinic review tool. No additional management support is needed unless otherwise documented below in the visit note.  

## 2015-09-19 LAB — COMPREHENSIVE METABOLIC PANEL
ALBUMIN: 3.4 g/dL — AB (ref 3.5–5.2)
ALT: 13 U/L (ref 0–35)
AST: 20 U/L (ref 0–37)
Alkaline Phosphatase: 70 U/L (ref 39–117)
BUN: 20 mg/dL (ref 6–23)
CALCIUM: 9.1 mg/dL (ref 8.4–10.5)
CO2: 29 meq/L (ref 19–32)
CREATININE: 0.9 mg/dL (ref 0.40–1.20)
Chloride: 99 mEq/L (ref 96–112)
GFR: 62.01 mL/min (ref >60.00)
Glucose, Bld: 82 mg/dL (ref 70–99)
Potassium: 4.6 mEq/L (ref 3.5–5.1)
Sodium: 133 mEq/L — ABNORMAL LOW (ref 135–145)
TOTAL PROTEIN: 6.7 g/dL (ref 6.0–8.3)
Total Bilirubin: 0.3 mg/dL (ref 0.2–1.2)

## 2015-09-19 LAB — CBC WITH DIFFERENTIAL/PLATELET
BASOS PCT: 1.5 % (ref 0.0–3.0)
Basophils Absolute: 0.1 10*3/uL (ref 0.0–0.1)
EOS ABS: 0.2 10*3/uL (ref 0.0–0.7)
Eosinophils Relative: 4 % (ref 0.0–5.0)
HCT: 38.2 % (ref 36.0–46.0)
HEMOGLOBIN: 12.8 g/dL (ref 12.0–15.0)
Lymphocytes Relative: 23.9 % (ref 12.0–46.0)
Lymphs Abs: 1.4 10*3/uL (ref 0.7–4.0)
MCHC: 33.6 g/dL (ref 30.0–36.0)
MCV: 90.5 fl (ref 78.0–100.0)
MONO ABS: 0.7 10*3/uL (ref 0.1–1.0)
Monocytes Relative: 12 % (ref 3.0–12.0)
Neutro Abs: 3.5 10*3/uL (ref 1.4–7.7)
Neutrophils Relative %: 58.6 % (ref 43.0–77.0)
Platelets: 239 10*3/uL (ref 150.0–400.0)
RBC: 4.22 Mil/uL (ref 3.87–5.11)
RDW: 13.8 % (ref 11.5–15.5)
WBC: 6 10*3/uL (ref 4.0–10.5)

## 2015-09-19 LAB — TSH: TSH: 2.47 u[IU]/mL (ref 0.35–4.50)

## 2015-09-20 ENCOUNTER — Encounter: Payer: Self-pay | Admitting: Internal Medicine

## 2015-09-20 NOTE — Assessment & Plan Note (Signed)
Seeing psychiatry.  Doing well on current regimen.

## 2015-09-20 NOTE — Assessment & Plan Note (Signed)
Seeing psychiatry

## 2015-09-20 NOTE — Assessment & Plan Note (Signed)
Continue to f/u with dermatology

## 2015-09-20 NOTE — Assessment & Plan Note (Signed)
Blood pressure under good control.  Continue same medication regimen.  Follow pressures.  Follow metabolic panel.   

## 2015-09-20 NOTE — Assessment & Plan Note (Signed)
Heme negative on exam.  She relates to straining.  Treat constipation as outlined.  She declines any further evaluation for the bleeding.  annusol HC suppositories.  Check cbc.

## 2015-09-20 NOTE — Assessment & Plan Note (Signed)
Sees cardiology.  In SR today.  Has been stable.  Follow.

## 2015-09-20 NOTE — Assessment & Plan Note (Signed)
No significant swelling on exam.  Lesion noted.  No infection.  Follow.  Notify me if does not heal.

## 2015-09-20 NOTE — Assessment & Plan Note (Signed)
Has been a chronic issue.  Discussed with her today.  Will restart lactulose on a regular basis.  Discussed her rectal bleeding.  She declines any further evaluation or w/up.  annusol HC suppositories.  Check cbc.

## 2015-09-24 ENCOUNTER — Telehealth: Payer: Self-pay | Admitting: *Deleted

## 2015-09-24 ENCOUNTER — Other Ambulatory Visit (INDEPENDENT_AMBULATORY_CARE_PROVIDER_SITE_OTHER): Payer: Medicare Other

## 2015-09-24 DIAGNOSIS — E871 Hypo-osmolality and hyponatremia: Secondary | ICD-10-CM

## 2015-09-24 NOTE — Telephone Encounter (Signed)
Labs and dx?  

## 2015-09-24 NOTE — Telephone Encounter (Signed)
Covering for Dr Nicki Reaper. BMET ordered.

## 2015-09-25 LAB — BASIC METABOLIC PANEL
BUN: 16 mg/dL (ref 6–23)
CO2: 30 mEq/L (ref 19–32)
Calcium: 8.6 mg/dL (ref 8.4–10.5)
Chloride: 98 mEq/L (ref 96–112)
Creatinine, Ser: 0.88 mg/dL (ref 0.40–1.20)
GFR: 63.64 mL/min (ref >60.00)
GLUCOSE: 78 mg/dL (ref 70–99)
POTASSIUM: 4.6 meq/L (ref 3.5–5.1)
SODIUM: 132 meq/L — AB (ref 135–145)

## 2015-09-26 ENCOUNTER — Other Ambulatory Visit: Payer: Self-pay | Admitting: Internal Medicine

## 2015-09-26 DIAGNOSIS — E871 Hypo-osmolality and hyponatremia: Secondary | ICD-10-CM

## 2015-09-26 NOTE — Progress Notes (Signed)
Order placed for f/u sodium.  ?

## 2015-10-12 ENCOUNTER — Other Ambulatory Visit (INDEPENDENT_AMBULATORY_CARE_PROVIDER_SITE_OTHER): Payer: Medicare Other

## 2015-10-12 DIAGNOSIS — E871 Hypo-osmolality and hyponatremia: Secondary | ICD-10-CM

## 2015-10-12 LAB — SODIUM: Sodium: 134 mEq/L — ABNORMAL LOW (ref 135–145)

## 2015-10-14 ENCOUNTER — Encounter: Payer: Self-pay | Admitting: Internal Medicine

## 2015-10-16 ENCOUNTER — Ambulatory Visit (INDEPENDENT_AMBULATORY_CARE_PROVIDER_SITE_OTHER): Payer: Medicare Other | Admitting: Psychiatry

## 2015-10-16 DIAGNOSIS — F331 Major depressive disorder, recurrent, moderate: Secondary | ICD-10-CM

## 2015-10-16 NOTE — Progress Notes (Signed)
BH MD/PA/NP OP Progress Note  10/16/2015 2:45 PM Lori Henson  MRN:  [id number]  Subjective:    Patient is a 82 year old married female who presented for the follow-up appointment. She uses walker to ambulate. She reported that she is becoming more active since her husband passed away. However she appeared very tired and sleepy during the interview. She reported that she usually  takes naps after the breakfast and her lunch. She has been compliant with her medication but she is not using the pillbox. She stated that she remembers take her medications on a regular basis. She denied having any suicidal ideations or plans. She stated that she lives in an independent living and has several friends over there. She appeared calm and collected during the interview. We discussed about her medications and will discontinue the Klonopin after this appointment. She agreed with the plan.She was brought in for the appointment by lady who has been helping her with her daily chores. She reported that her mood is fine and she is not having suicidal homicidal ideations or plans. She has good support system.  Chief Complaint:   Visit Diagnosis:     ICD-9-CM ICD-10-CM   1. MDD (major depressive disorder), recurrent episode, moderate ([place]) 296.32 F33.1     Past Medical History:  Past Medical History  Diagnosis Date  . Hypertension   . 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 Lichen planus   . Angular cheilitis   . Osteoporosis     fosamax, compression fx s/p kyphoplasty  . Dysrhythmia   . Presence of permanent cardiac pacemaker   . Anxiety   . GERD (gastroesophageal reflux disease)   . Atrial flutter ([place])   . Malignant melanoma ([place]) 2001    right leg, s/p radiation, recurrence 2008    Past Surgical History  Procedure Laterality Date  . Cholecystectomy  2009  . Tonsillectomy and adenoidectomy  1933  . Kyphoplasty  2007    L-5  . Bladder support/spark sling  2007    s/p anterior repair with  pubovaginal sling and cystoscopy  . Pacemaker placement  2007  . Surgery for melanoma    . Tubal ligation    . Hemorroidectomy    . Hernia repair  1980  . Cataract surgery  1998    x2  . Tonsillectomy    . Back surgery    . Insert / replace / remove pacemaker    . Eye surgery Bilateral     Cataract Extraction  . Fracture surgery Left     fractured left wrist with plate inserted  . Pacemaker insertion N/A 03/07/2015    Procedure: / pacemaker change out;  Surgeon: Isaias Cowman, MD;  Location: [facility] ORS;  Service: Cardiovascular;  Laterality: N/A;   Family History:  Family History  Problem Relation Age of Onset  . Heart disease Mother   . Heart disease Father     died age 34  . Stroke Sister   . Kidney disease Brother   . Uterine cancer Maternal Aunt   . Ovarian cancer      cousin   Social History:  Social History   Social History  . Marital Status: Married    Spouse Name: N/A  . Number of Children: 4  . Years of Education: N/A   Social History Main Topics  . Smoking status: Never Smoker   . Smokeless tobacco: Never Used  . Alcohol Use: No  . Drug Use: No  . Sexual Activity:  No   Other Topics Concern  . Not on file   Social History Narrative   Additional History:  Lives in retirement center.   Assessment:   Musculoskeletal: Strength & Muscle Tone: within normal limits Gait & Station: slow with walker Patient leans: N/A  Psychiatric Specialty Exam: HPI  Review of Systems  Psychiatric/Behavioral: Positive for depression. The patient is nervous/anxious.   All other systems reviewed and are negative.   There were no vitals taken for this visit.There is no weight on file to calculate BMI.  General Appearance: Casual  Eye Contact:  Fair  Speech:  Clear and Coherent  Volume:  Decreased  Mood:  Anxious  Affect:  Congruent  Thought Process:  Coherent  Orientation:  Full (Time, Place, and Person)  Thought Content:  WDL  Suicidal Thoughts:  No   Homicidal Thoughts:  No  Memory:  Immediate;   Fair  Judgement:  Fair  Insight:  Fair  Psychomotor Activity:  Decreased  Concentration:  Fair  Recall:  [employer] of Knowledge: Fair  Language: Fair  Akathisia:  No  Handed:  Right  AIMS (if indicated):    Assets:  Communication Skills Social Support  ADL's:  Intact  Cognition: WNL  Sleep:     Is the patient at risk to self?  No. Has the patient been a risk to self in the past 6 months?  No. Has the patient been a risk to self within the distant past?  No. Is the patient a risk to others?  No. Has the patient been a risk to others in the past 6 months?  No. Has the patient been a risk to others within the distant past?  No.  Current Medications: Current Outpatient Prescriptions  Medication Sig Dispense Refill  . aspirin EC 81 MG tablet Take 81 mg by mouth daily.    . calcium carbonate (TUMS EX) 750 MG chewable tablet Chew 1 tablet by mouth 2 (two) times daily.    Sarajane Marek Sodium 30-100 MG CAPS Take 1 capsule by mouth as needed.     . Cholecalciferol ([employer]S VITAMIN D3) 1000 UNITS capsule Take 1,000 Units by mouth daily.    . clonazePAM (KLONOPIN) 0.5 MG tablet Take 0.5 tablets (0.25 mg total) by mouth daily as needed for anxiety. 15 tablet 1  . lisinopril (PRINIVIL,ZESTRIL) 10 MG tablet take 1 tablet by mouth twice a day 60 tablet 5  . metoprolol succinate (TOPROL-XL) 50 MG 24 hr tablet Take 50 mg by mouth daily.  0  . mirtazapine (REMERON) 30 MG tablet Take 1 tablet (30 mg total) by mouth at bedtime. 90 tablet 1  . Multiple Vitamins-Minerals (ICAPS) CAPS Take by mouth 2 (two) times daily.     Marland Kitchen PARoxetine (PAXIL) 10 MG tablet Take 1 tablet (10 mg total) by mouth daily. 90 tablet 1  . sorbitol 70 % SOLN use if needed 1000 mL 3   No current facility-administered medications for this visit.    Medical Decision Making:  Established Problem, Stable/Improving (1) and Review of Psycho-Social Stressors (1)  Treatment  Plan Summary:Medication management   Depression  She will continue on Remeron 30 mg daily as well as Decrease Paxil 5 mg by mouth daily at bedtime   Anxiety Discontinue Klonopin as she appeared very tired during the interview  Follow-up 2 Months or earlier depending on her symptoms   More than 50% of the time spent in psychoeducation, counseling and coordination of care.    This  note was generated in part or whole with voice recognition software. Voice regonition is usually quite accurate but there are transcription errors that can and very often do occur. I apologize for any typographical errors that were not detected and corrected.    Rainey Pines, MD  10/16/2015, 2:45 PM

## 2015-10-17 NOTE — Telephone Encounter (Signed)
Unread mychart message mailed to patient 

## 2015-10-22 ENCOUNTER — Other Ambulatory Visit: Payer: Self-pay | Admitting: Internal Medicine

## 2015-11-04 ENCOUNTER — Inpatient Hospital Stay
Admission: EM | Admit: 2015-11-04 | Discharge: 2015-11-09 | DRG: 690 | Disposition: A | Discharge status: Skilled Nursing Facility | Payer: Medicare Other | Attending: Internal Medicine | Admitting: Internal Medicine

## 2015-11-04 ENCOUNTER — Encounter: Payer: Self-pay | Admitting: Emergency Medicine

## 2015-11-04 ENCOUNTER — Emergency Department: Payer: Medicare Other

## 2015-11-04 DIAGNOSIS — K5900 Constipation, unspecified: Secondary | ICD-10-CM

## 2015-11-04 DIAGNOSIS — S39012A Strain of muscle, fascia and tendon of lower back, initial encounter: Secondary | ICD-10-CM

## 2015-11-04 DIAGNOSIS — N390 Urinary tract infection, site not specified: Principal | ICD-10-CM

## 2015-11-04 DIAGNOSIS — K219 Gastro-esophageal reflux disease without esophagitis: Secondary | ICD-10-CM | POA: Diagnosis present

## 2015-11-04 DIAGNOSIS — I10 Essential (primary) hypertension: Secondary | ICD-10-CM | POA: Diagnosis present

## 2015-11-04 DIAGNOSIS — N3289 Other specified disorders of bladder: Secondary | ICD-10-CM | POA: Diagnosis present

## 2015-11-04 DIAGNOSIS — Z85820 Personal history of malignant melanoma of skin: Secondary | ICD-10-CM

## 2015-11-04 DIAGNOSIS — G8929 Other chronic pain: Secondary | ICD-10-CM | POA: Diagnosis present

## 2015-11-04 DIAGNOSIS — Z7982 Long term (current) use of aspirin: Secondary | ICD-10-CM

## 2015-11-04 DIAGNOSIS — M549 Dorsalgia, unspecified: Secondary | ICD-10-CM | POA: Diagnosis present

## 2015-11-04 DIAGNOSIS — E871 Hypo-osmolality and hyponatremia: Secondary | ICD-10-CM | POA: Diagnosis present

## 2015-11-04 DIAGNOSIS — B9620 Unspecified Escherichia coli [E. coli] as the cause of diseases classified elsewhere: Secondary | ICD-10-CM | POA: Diagnosis present

## 2015-11-04 DIAGNOSIS — I4892 Unspecified atrial flutter: Secondary | ICD-10-CM | POA: Diagnosis present

## 2015-11-04 DIAGNOSIS — E876 Hypokalemia: Secondary | ICD-10-CM | POA: Diagnosis present

## 2015-11-04 DIAGNOSIS — Z66 Do not resuscitate: Secondary | ICD-10-CM | POA: Diagnosis present

## 2015-11-04 DIAGNOSIS — Z882 Allergy status to sulfonamides status: Secondary | ICD-10-CM

## 2015-11-04 DIAGNOSIS — E861 Hypovolemia: Secondary | ICD-10-CM | POA: Diagnosis present

## 2015-11-04 DIAGNOSIS — G2581 Restless legs syndrome: Secondary | ICD-10-CM | POA: Diagnosis present

## 2015-11-04 DIAGNOSIS — E222 Syndrome of inappropriate secretion of antidiuretic hormone: Secondary | ICD-10-CM | POA: Diagnosis present

## 2015-11-04 DIAGNOSIS — I4891 Unspecified atrial fibrillation: Secondary | ICD-10-CM | POA: Diagnosis present

## 2015-11-04 DIAGNOSIS — M8088XA Other osteoporosis with current pathological fracture, vertebra(e), initial encounter for fracture: Secondary | ICD-10-CM | POA: Diagnosis present

## 2015-11-04 DIAGNOSIS — Z950 Presence of cardiac pacemaker: Secondary | ICD-10-CM

## 2015-11-04 DIAGNOSIS — Z888 Allergy status to other drugs, medicaments and biological substances status: Secondary | ICD-10-CM

## 2015-11-04 DIAGNOSIS — F419 Anxiety disorder, unspecified: Secondary | ICD-10-CM | POA: Diagnosis present

## 2015-11-04 LAB — URINALYSIS COMPLETE WITH MICROSCOPIC (ARMC ONLY)
Bilirubin Urine: NEGATIVE
Glucose, UA: NEGATIVE mg/dL
Hgb urine dipstick: NEGATIVE
Nitrite: POSITIVE — AB
PH: 6 (ref 5.0–8.0)
PROTEIN: 100 mg/dL — AB
SPECIFIC GRAVITY, URINE: 1.015 (ref 1.005–1.030)
Squamous Epithelial / LPF: NONE SEEN

## 2015-11-04 LAB — URINE DRUG SCREEN, QUALITATIVE (ARMC ONLY)
AMPHETAMINES, UR SCREEN: NOT DETECTED
BENZODIAZEPINE, UR SCRN: NOT DETECTED
Barbiturates, Ur Screen: NOT DETECTED
CANNABINOID 50 NG, UR ~~LOC~~: NOT DETECTED
Cocaine Metabolite,Ur ~~LOC~~: NOT DETECTED
MDMA (ECSTASY) UR SCREEN: NOT DETECTED
Methadone Scn, Ur: NOT DETECTED
Opiate, Ur Screen: POSITIVE — AB
PHENCYCLIDINE (PCP) UR S: NOT DETECTED
TRICYCLIC, UR SCREEN: NOT DETECTED

## 2015-11-04 LAB — COMPREHENSIVE METABOLIC PANEL
ALBUMIN: 3.5 g/dL (ref 3.5–5.0)
ALT: 16 U/L (ref 14–54)
ANION GAP: 9 (ref 5–15)
AST: 30 U/L (ref 15–41)
Alkaline Phosphatase: 94 U/L (ref 38–126)
BUN: 14 mg/dL (ref 6–20)
CHLORIDE: 91 mmol/L — AB (ref 101–111)
CO2: 24 mmol/L (ref 22–32)
Calcium: 8.4 mg/dL — ABNORMAL LOW (ref 8.9–10.3)
Creatinine, Ser: 0.53 mg/dL (ref 0.44–1.00)
GFR calc non Af Amer: >60 mL/min (ref >60)
GLUCOSE: 115 mg/dL — AB (ref 65–99)
POTASSIUM: 4 mmol/L (ref 3.5–5.1)
SODIUM: 124 mmol/L — AB (ref 135–145)
Total Bilirubin: 0.8 mg/dL (ref 0.3–1.2)
Total Protein: 7.1 g/dL (ref 6.5–8.1)

## 2015-11-04 LAB — CBC
HEMATOCRIT: 34.8 % — AB (ref 35.0–47.0)
HEMOGLOBIN: 12.5 g/dL (ref 12.0–16.0)
MCH: 31.8 pg (ref 26.0–34.0)
MCHC: 35.9 g/dL (ref 32.0–36.0)
MCV: 88.6 fL (ref 80.0–100.0)
Platelets: 215 10*3/uL (ref 150–440)
RBC: 3.93 MIL/uL (ref 3.80–5.20)
RDW: 14.2 % (ref 11.5–14.5)
WBC: 10.2 10*3/uL (ref 3.6–11.0)

## 2015-11-04 LAB — MRSA PCR SCREENING: MRSA BY PCR: NEGATIVE

## 2015-11-04 LAB — LIPASE, BLOOD: LIPASE: 18 U/L (ref 11–51)

## 2015-11-04 MED ORDER — ACETAMINOPHEN 650 MG RE SUPP: 650.0000 mg | Freq: Four times a day (QID) | RECTAL | Status: DC | PRN

## 2015-11-04 MED ORDER — ACETAMINOPHEN 325 MG PO TABS
650.0000 mg | ORAL_TABLET | Freq: Four times a day (QID) | ORAL | Status: DC | PRN
  Filled 2015-11-04: qty 2

## 2015-11-04 MED ORDER — SODIUM CHLORIDE 0.9% FLUSH
3.0000 mL | Freq: Two times a day (BID) | INTRAVENOUS | Status: DC
  Administered 2015-11-04: 22:00:00 via INTRAVENOUS
  Administered 2015-11-05: 3 mL via INTRAVENOUS

## 2015-11-04 MED ORDER — METHYLPREDNISOLONE 4 MG PO TBPK
8.0000 mg | ORAL_TABLET | Freq: Every evening | ORAL | Status: AC
  Administered 2015-11-06: 8 mg via ORAL

## 2015-11-04 MED ORDER — LISINOPRIL 10 MG PO TABS
10.0000 mg | ORAL_TABLET | Freq: Two times a day (BID) | ORAL | Status: DC
  Administered 2015-11-04 – 2015-11-09 (×10): 10 mg via ORAL
  Filled 2015-11-04 (×10): qty 1

## 2015-11-04 MED ORDER — DIATRIZOATE MEGLUMINE & SODIUM 66-10 % PO SOLN
15.0000 mL | Freq: Once | ORAL | Status: AC
  Administered 2015-11-04: 15 mL via ORAL

## 2015-11-04 MED ORDER — SENNA 8.6 MG PO TABS
1.0000 | ORAL_TABLET | Freq: Two times a day (BID) | ORAL | Status: DC
  Administered 2015-11-04 – 2015-11-09 (×10): 8.6 mg via ORAL
  Filled 2015-11-04 (×10): qty 1

## 2015-11-04 MED ORDER — SODIUM CHLORIDE 0.9 % IV SOLN
Freq: Once | INTRAVENOUS | Status: AC
  Administered 2015-11-04: 16:00:00 via INTRAVENOUS

## 2015-11-04 MED ORDER — ONDANSETRON HCL 4 MG PO TABS: 4.0000 mg | ORAL_TABLET | Freq: Four times a day (QID) | ORAL | Status: DC | PRN

## 2015-11-04 MED ORDER — METHYLPREDNISOLONE 4 MG PO TBPK
8.0000 mg | ORAL_TABLET | Freq: Every evening | ORAL | Status: AC
  Administered 2015-11-05: 8 mg via ORAL

## 2015-11-04 MED ORDER — METHYLPREDNISOLONE 4 MG PO TBPK
8.0000 mg | ORAL_TABLET | Freq: Every morning | ORAL | Status: AC
  Administered 2015-11-05: 8 mg via ORAL
  Filled 2015-11-04: qty 21

## 2015-11-04 MED ORDER — METHYLPREDNISOLONE 4 MG PO TBPK: 4.0000 mg | ORAL_TABLET | Freq: Three times a day (TID) | ORAL | Status: DC

## 2015-11-04 MED ORDER — METHYLPREDNISOLONE 4 MG PO TBPK: 8.0000 mg | ORAL_TABLET | Freq: Every evening | ORAL | Status: DC

## 2015-11-04 MED ORDER — METHYLPREDNISOLONE 4 MG PO TBPK
4.0000 mg | ORAL_TABLET | Freq: Three times a day (TID) | ORAL | Status: AC
  Administered 2015-11-05 (×2): 4 mg via ORAL

## 2015-11-04 MED ORDER — METHYLPREDNISOLONE 4 MG PO TBPK: 8.0000 mg | ORAL_TABLET | Freq: Every morning | ORAL | Status: DC

## 2015-11-04 MED ORDER — METHYLPREDNISOLONE 4 MG PO TBPK: 4.0000 mg | ORAL_TABLET | ORAL | Status: DC

## 2015-11-04 MED ORDER — ONDANSETRON HCL 4 MG/2ML IJ SOLN: 4.0000 mg | Freq: Four times a day (QID) | INTRAMUSCULAR | Status: DC | PRN

## 2015-11-04 MED ORDER — DEXTROSE 5 % IV SOLN
1.0000 g | Freq: Once | INTRAVENOUS | Status: AC
  Administered 2015-11-04: 1 g via INTRAVENOUS
  Filled 2015-11-04: qty 10

## 2015-11-04 MED ORDER — MIRTAZAPINE 15 MG PO TABS
30.0000 mg | ORAL_TABLET | Freq: Every day | ORAL | Status: DC
Start: 2015-11-04 — End: 2015-11-09
  Administered 2015-11-04 – 2015-11-08 (×5): 30 mg via ORAL
  Filled 2015-11-04 (×5): qty 2

## 2015-11-04 MED ORDER — ASPIRIN EC 81 MG PO TBEC
81.0000 mg | DELAYED_RELEASE_TABLET | Freq: Every day | ORAL | Status: DC
  Administered 2015-11-04 – 2015-11-09 (×6): 81 mg via ORAL
  Filled 2015-11-04 (×6): qty 1

## 2015-11-04 MED ORDER — DEXTROSE 5 % IV SOLN
1.0000 g | INTRAVENOUS | Status: DC
  Administered 2015-11-05 – 2015-11-07 (×3): 1 g via INTRAVENOUS
  Filled 2015-11-04 (×4): qty 10

## 2015-11-04 MED ORDER — VITAMIN D 1000 UNITS PO TABS
1000.0000 [IU] | ORAL_TABLET | Freq: Every day | ORAL | Status: DC
  Administered 2015-11-04 – 2015-11-09 (×6): 1000 [IU] via ORAL
  Filled 2015-11-04 (×6): qty 1

## 2015-11-04 MED ORDER — SODIUM CHLORIDE 0.9% FLUSH: 3.0000 mL | INTRAVENOUS | Status: DC | PRN

## 2015-11-04 MED ORDER — HEPARIN SODIUM (PORCINE) 5000 UNIT/ML IJ SOLN
5000.0000 [IU] | Freq: Three times a day (TID) | INTRAMUSCULAR | Status: DC
  Administered 2015-11-04 – 2015-11-05 (×2): 5000 [IU] via SUBCUTANEOUS
  Filled 2015-11-04 (×3): qty 1

## 2015-11-04 MED ORDER — PAROXETINE HCL 10 MG PO TABS
5.0000 mg | ORAL_TABLET | Freq: Every day | ORAL | Status: DC
  Administered 2015-11-04 – 2015-11-09 (×6): 5 mg via ORAL
  Filled 2015-11-04: qty 1
  Filled 2015-11-04: qty 2
  Filled 2015-11-04: qty 0.5
  Filled 2015-11-04 (×2): qty 1
  Filled 2015-11-04: qty 0.5
  Filled 2015-11-04 (×2): qty 1

## 2015-11-04 MED ORDER — METHYLPREDNISOLONE 4 MG PO TBPK
4.0000 mg | ORAL_TABLET | ORAL | Status: AC
  Administered 2015-11-05: 4 mg via ORAL

## 2015-11-04 MED ORDER — SODIUM CHLORIDE 0.9 % IV SOLN
250.0000 mL | INTRAVENOUS | Status: DC | PRN
Start: 2015-11-04 — End: 2015-11-05

## 2015-11-04 MED ORDER — CYCLOBENZAPRINE HCL 10 MG PO TABS
5.0000 mg | ORAL_TABLET | Freq: Three times a day (TID) | ORAL | Status: DC
  Administered 2015-11-04 – 2015-11-09 (×15): 5 mg via ORAL
  Filled 2015-11-04 (×12): qty 1
  Filled 2015-11-04: qty 2
  Filled 2015-11-04 (×2): qty 1

## 2015-11-04 MED ORDER — METOPROLOL SUCCINATE ER 50 MG PO TB24
50.0000 mg | ORAL_TABLET | Freq: Every day | ORAL | Status: DC
  Administered 2015-11-05 – 2015-11-08 (×4): 50 mg via ORAL
  Filled 2015-11-04 (×5): qty 1

## 2015-11-04 MED ORDER — DIAZEPAM 2 MG PO TABS
2.0000 mg | ORAL_TABLET | Freq: Once | ORAL | Status: AC
Start: 2015-11-04 — End: 2015-11-04
  Administered 2015-11-04: 2 mg via ORAL
  Filled 2015-11-04: qty 1

## 2015-11-04 MED ORDER — MORPHINE SULFATE (PF) 2 MG/ML IV SOLN: 1.0000 mg | INTRAVENOUS | Status: DC | PRN

## 2015-11-04 MED ORDER — HYDROCODONE-ACETAMINOPHEN 5-325 MG PO TABS
1.0000 | ORAL_TABLET | ORAL | Status: DC | PRN
  Administered 2015-11-06 – 2015-11-09 (×5): 1 via ORAL
  Filled 2015-11-04 (×2): qty 1
  Filled 2015-11-04: qty 2
  Filled 2015-11-04 (×3): qty 1

## 2015-11-04 MED ORDER — CALCIUM CARBONATE ANTACID 500 MG PO CHEW
1.0000 | CHEWABLE_TABLET | Freq: Two times a day (BID) | ORAL | Status: DC
  Administered 2015-11-04 – 2015-11-09 (×10): 200 mg via ORAL
  Filled 2015-11-04 (×10): qty 1

## 2015-11-04 MED ORDER — DOCUSATE SODIUM 100 MG PO CAPS
100.0000 mg | ORAL_CAPSULE | Freq: Two times a day (BID) | ORAL | Status: DC
  Administered 2015-11-04 – 2015-11-09 (×10): 100 mg via ORAL
  Filled 2015-11-04 (×10): qty 1

## 2015-11-04 MED ORDER — IOPAMIDOL (ISOVUE-300) INJECTION 61%
75.0000 mL | Freq: Once | INTRAVENOUS | Status: AC | PRN
  Administered 2015-11-04: 75 mL via INTRAVENOUS

## 2015-11-04 MED ORDER — MORPHINE SULFATE (PF) 4 MG/ML IV SOLN
4.0000 mg | Freq: Once | INTRAVENOUS | Status: AC
  Administered 2015-11-04: 4 mg via INTRAVENOUS
  Filled 2015-11-04: qty 1

## 2015-11-04 MED ORDER — METHYLPREDNISOLONE 4 MG PO TBPK
4.0000 mg | ORAL_TABLET | Freq: Four times a day (QID) | ORAL | Status: DC
  Administered 2015-11-07 – 2015-11-09 (×8): 4 mg via ORAL

## 2015-11-04 MED ORDER — METHYLPREDNISOLONE 4 MG PO TBPK: 4.0000 mg | ORAL_TABLET | Freq: Four times a day (QID) | ORAL | Status: DC

## 2015-11-04 NOTE — Care Management Obs Status (Signed)
[place] NOTIFICATION   Patient Details  Name: Lori Henson MRN: [id number] Date of Birth: 01-04-1920   Medicare Observation Status Notification Given:  Yes    Ival Bible, RN 11/04/2015, 7:34 PM

## 2015-11-04 NOTE — ED Provider Notes (Signed)
[facility] Emergency Department Provider Note        Time seen: ----------------------------------------- 3:32 PM on 11/04/2015 -----------------------------------------    I have reviewed the triage vital signs and the nursing notes.   HISTORY  Chief Complaint Back Pain and Abdominal Pain    HPI Lori Henson is a 82 y.o. female who presents to ER coming from [facility] for back spasms in the right lower back. Patient states symptoms started last Thursday, she was seen at an urgent care on Friday and placed on pain medication. She states these have helped her pain some but she does not have anymore. She was seen at [facility] clinic on Friday and had 2 x-rays done and was sent home with Vicodin. She subsequently became constipated. Abdomen has been somewhat distended. She has not had vomiting or fever.   Past Medical History:  Diagnosis Date  . Angular cheilitis   . Anxiety   . Atrial flutter ([place])   . Bradycardia 2007   s/p pacemaker  . Dysrhythmia   . GERD (gastroesophageal reflux disease)   . Hypertension   . Lichen planus   . Malignant melanoma ([place]) 2001   right leg, s/p radiation, recurrence 2008  . Osteoporosis    fosamax, compression fx s/p kyphoplasty  . Presence of permanent cardiac pacemaker     Patient Active Problem List   Diagnosis Date Noted  . Rectal bleeding 09/18/2015  . Fall 01/13/2015  . Cough 10/15/2014  . AV junctional bradycardia 07/07/2014  . Gastritis 06/12/2014  . Hair loss 06/12/2014  . Gastric catarrh 06/12/2014  . 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 Lumbar and sacral osteoarthritis 05/10/2014  . Dysphagia 04/09/2014  . Can't get food down 03/24/2014  . Abdominal pain 02/20/2014  . Wound of ankle 02/20/2014  . Open wound of knee, leg (except thigh), and ankle 02/20/2014  . Hair thinning 11/11/2013  . Unsteady gait 11/11/2013  . Back pain 11/11/2013  . Abnormal gait  11/11/2013  . Alopecia 11/11/2013  . Atrial flutter ([place]) 09/05/2013  . Chronic pain 09/05/2013  . Artificial cardiac pacemaker 09/05/2013  . Edema of foot 09/05/2013  . Prolapse of urethra 09/05/2013  . Squamous cell skin cancer 08/23/2013  . Cancer of skin, squamous cell 08/23/2013  . Restless leg syndrome 02/18/2013  . Restless leg 02/18/2013  . Failure to thrive 01/31/2013  . Neoplasm of uncertain behavior [id number]  . Depression 12/05/2012  . Depression, major, single episode 12/05/2012  . Major depressive disorder with single episode ([place]) 12/05/2012  . Hyponatremia 06/07/2012  . Hypo-osmolality and hyponatremia 06/07/2012  . Lower extremity edema 05/11/2012  . Accumulation of fluid in tissues 05/11/2012  . Edema 05/11/2012  . Hypertension 02/07/2012  . Malignant melanoma ([place]) 02/07/2012  . Essential (primary) hypertension 02/07/2012  . 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 Malignant melanoma of skin ([place]) 02/07/2012  . Chronic constipation 02/06/2012  . CN (constipation) 02/06/2012    Past Surgical History:  Procedure Laterality Date  . BACK SURGERY    . bladder support/spark sling  2007   s/p anterior repair with pubovaginal sling and cystoscopy  . cataract surgery  1998   x2  . CHOLECYSTECTOMY  2009  . EYE SURGERY Bilateral    Cataract Extraction  . FRACTURE SURGERY Left    fractured left wrist with plate inserted  . HEMORROIDECTOMY    . HERNIA REPAIR  1980  . INSERT / REPLACE / REMOVE PACEMAKER    .  KYPHOPLASTY  2007   L-5  . PACEMAKER INSERTION N/A 03/07/2015   Procedure: / pacemaker change out;  Surgeon: Isaias Cowman, MD;  Location: [facility] ORS;  Service: Cardiovascular;  Laterality: N/A;  . PACEMAKER PLACEMENT  2007  . surgery for melanoma    . TONSILLECTOMY    . TONSILLECTOMY AND ADENOIDECTOMY  1933  . TUBAL LIGATION      Allergies Macrobid [nitrofurantoin macrocrystal]; Nitrofurantoin; Ranitidine hcl; and Zantac [ranitidine  hcl]  Social History Social History  Substance Use Topics  . Smoking status: Never Smoker  . Smokeless tobacco: Never Used  . Alcohol use No    Review of Systems Constitutional: Negative for fever. Eyes: Negative for visual changes. ENT: Negative for sore throat. Cardiovascular: Negative for chest pain. Respiratory: Negative for shortness of breath. Gastrointestinal: Positive for abdominal pain, constipation Genitourinary: Negative for dysuria. Musculoskeletal: Positive for right low back pain Skin: Negative for rash. Neurological: Negative for headaches, focal weakness or numbness.  10-point ROS otherwise negative.  ____________________________________________   PHYSICAL EXAM:  VITAL SIGNS: ED Triage Vitals  Enc Vitals Group     BP 11/04/15 1400 (!) 154/97     Pulse Rate 11/04/15 1400 60     Resp 11/04/15 1400 20     Temp 11/04/15 1400 97.9 F (36.6 C)     Temp Source 11/04/15 1400 Oral     SpO2 11/04/15 1400 95 %     Weight 11/04/15 1400 137 lb (62.1 kg)     Height 11/04/15 1400 5\' 6"  (1.676 m)     Head Circumference --      Peak Flow --      Pain Score 11/04/15 1401 4     Pain Loc --      Pain Edu? --      Excl. in [place]? --     Constitutional: Alert and oriented. Well appearing and in no distress. Eyes: Conjunctivae are normal. PERRL. Normal extraocular movements. ENT   Head: Normocephalic and atraumatic.   Nose: No congestion/rhinnorhea.   Mouth/Throat: Mucous membranes are moist.   Neck: No stridor. Cardiovascular: Normal rate, regular rhythm. No murmurs, rubs, or gallops. Respiratory: Normal respiratory effort without tachypnea nor retractions. Breath sounds are clear and equal bilaterally. No wheezes/rales/rhonchi. Gastrointestinal: Distended, nontender. Normal bowel sounds. Musculoskeletal: Nontender with normal range of motion in all extremities. No lower extremity tenderness nor edema. Negative cross straight leg raise  examination Neurologic:  Normal speech and language. No gross focal neurologic deficits are appreciated.  Skin:  Skin is warm, dry and intact. No rash noted. Psychiatric: Mood and affect are normal. Speech and behavior are normal.  ___________________________________________  ED COURSE:  Pertinent labs & imaging results that were available during my care of the patient were reviewed by me and considered in my medical decision making (see chart for details). Clinical Course  Comment By Time  Patient found to be significantly hyponatremic and hypochloremic. She'll be started on saline. Earleen Newport, MD 07/30 [phone]  Patient is in no acute distress, we will obtain basic labs and CT imaging to rule out other etiology.  Procedures ____________________________________________   LABS (pertinent positives/negatives)  Labs Reviewed  COMPREHENSIVE METABOLIC PANEL - Abnormal; Notable for the following:       Result Value   Sodium 124 (*)    Chloride 91 (*)    Glucose, Bld 115 (*)    Calcium 8.4 (*)    All other components within normal limits  CBC - Abnormal; Notable for  the following:    HCT 34.8 (*)    All other components within normal limits  URINALYSIS COMPLETEWITH MICROSCOPIC ([facility] ONLY) - Abnormal; Notable for the following:    Color, Urine YELLOW (*)    APPearance TURBID (*)    Ketones, ur 1+ (*)    Protein, ur 100 (*)    Nitrite POSITIVE (*)    Leukocytes, UA 3+ (*)    Bacteria, UA RARE (*)    All other components within normal limits  LIPASE, BLOOD  CBC WITH DIFFERENTIAL/PLATELET  COMPREHENSIVE METABOLIC PANEL  LIPASE, BLOOD    RADIOLOGY Images were viewed by me  CT the abdomen and pelvis with contrast IMPRESSION: No acute abdominal/ pelvic findings, mass lesions or lymphadenopathy. Numerous renal cysts but no worrisome renal lesions or obstructive findings. Scarring changes involving the left kidney. Tortuous ectatic and calcified abdominal aorta and branch  vessels. The bladder wall is slightly thickened. Possible mild inflammation could suggest cystitis. Small ovarian cysts. Numerous lumbar compression fractures, stable. ____________________________________________  FINAL ASSESSMENT AND PLAN  Flank pain, Hyponatremia, UTI  Plan: Patient with labs and imaging as dictated above. Patient's no acute distress, currently flank pain is improved. She does have significant UTI, urine culture will be sent. I don't think the flank pain is coming from pyelonephritis as the kidneys looked normal. She will be given IV Rocephin, she will also need periodic pain medicine for what appears to be muscle spasms. Sodium also found to be low, she is receiving saline infusion.   Earleen Newport, MD   Note: This dictation was prepared with Dragon dictation. Any transcriptional errors that result from this process are unintentional    Earleen Newport, MD 11/04/15 [phone]

## 2015-11-04 NOTE — H&P (Signed)
[place] [facility] [place] NAME: Lori Henson    MR#:  [id number]  DATE OF BIRTH:  December 31, 1919  DATE OF ADMISSION:  11/04/2015  PRIMARY CARE PHYSICIAN: Einar Pheasant, MD   REQUESTING/REFERRING PHYSICIAN: Dr. Esperanza Heir  CHIEF COMPLAINT:   Chief Complaint  Patient presents with  . Back Pain  . Abdominal Pain    HISTORY OF PRESENT ILLNESS: Lori Henson  is a 82 y.o. female with a known history of  Pacemaker, anxiety, hypertension, GERD, osteoporosis and history of back surgery who currently resides in independent living facility is presenting to the emergency room with left right lower back pain. She states that the pain occurs whenever she most. She also complains of spasms. Patient was seen at an urgent care on Friday and placed on Vicodin which did not seem to help her symptoms. She also having spasms of the back as well.  Patient came to the emergency room with these complaints. Had a CT scan of the abdomen which showed old lumbar fractures. But no new abnormality was noted. She was noted to have a urinary tract infection. He denies any fevers chills no urinary frequency urgency or hesitancy      PAST MEDICAL HISTORY:   Past Medical History:  Diagnosis Date  . Angular cheilitis   . Anxiety   . Atrial flutter ([place])   . Bradycardia 2007   s/p pacemaker  . Dysrhythmia   . GERD (gastroesophageal reflux disease)   . Hypertension   . Lichen planus   . Malignant melanoma ([place]) 2001   right leg, s/p radiation, recurrence 2008  . Osteoporosis    fosamax, compression fx s/p kyphoplasty  . Presence of permanent cardiac pacemaker     PAST SURGICAL HISTORY: Past Surgical History:  Procedure Laterality Date  . BACK SURGERY    . bladder support/spark sling  2007   s/p anterior repair with pubovaginal sling and cystoscopy  . cataract surgery  1998   x2  . CHOLECYSTECTOMY  2009  . EYE SURGERY Bilateral    Cataract Extraction  .  FRACTURE SURGERY Left    fractured left wrist with plate inserted  . HEMORROIDECTOMY    . HERNIA REPAIR  1980  . INSERT / REPLACE / REMOVE PACEMAKER    . KYPHOPLASTY  2007   L-5  . PACEMAKER INSERTION N/A 03/07/2015   Procedure: / pacemaker change out;  Surgeon: Isaias Cowman, MD;  Location: [facility] ORS;  Service: Cardiovascular;  Laterality: N/A;  . PACEMAKER PLACEMENT  2007  . surgery for melanoma    . TONSILLECTOMY    . TONSILLECTOMY AND ADENOIDECTOMY  1933  . TUBAL LIGATION      SOCIAL HISTORY:  Social History  Substance Use Topics  . Smoking status: Never Smoker  . Smokeless tobacco: Never Used  . Alcohol use No    FAMILY HISTORY:  Family History  Problem Relation Age of [place]  . Heart disease Mother   . Heart disease Father     died age 1  . Stroke Sister   . Kidney disease Brother   . Uterine cancer Maternal Aunt   . Ovarian cancer      cousin    DRUG ALLERGIES:  Allergies  Allergen Reactions  . Macrobid [Nitrofurantoin Macrocrystal] Itching  . Nitrofurantoin Itching and Other (See Comments)  . Ranitidine Hcl Diarrhea  . Zantac [Ranitidine Hcl] Diarrhea    REVIEW OF SYSTEMS:   CONSTITUTIONAL: No fever, fatigue or  weakness.  EYES: No blurred or double vision.  EARS, NOSE, AND THROAT: No tinnitus or ear pain.  RESPIRATORY: No cough, shortness of breath, wheezing or hemoptysis.  CARDIOVASCULAR: No chest pain, orthopnea, edema.  GASTROINTESTINAL: No nausea, vomiting, diarrhea or abdominal pain.  Positive constipation  GENITOURINARY: No dysuria, hematuria.  ENDOCRINE: No polyuria, nocturia,  HEMATOLOGY: No anemia, easy bruising or bleeding SKIN: No rash or lesion. MUSCULOSKELETAL: No joint pain or arthritis.Complains of back pain and spasms   NEUROLOGIC: No tingling, numbness, weakness.  PSYCHIATRY: No anxiety or depression.   MEDICATIONS AT HOME:  Prior to Admission medications   Medication Sig Start Date End Date Taking? Authorizing Provider   aspirin EC 81 MG tablet Take 81 mg by mouth daily.   Yes Historical Provider, MD  Casanthranol-Docusate Sodium 30-100 MG CAPS Take 1 capsule by mouth as needed.    Yes Historical Provider, MD  Cholecalciferol ([employer] VITAMIN D3) 1000 UNITS capsule Take 1,000 Units by mouth daily.   Yes Historical Provider, MD  lisinopril (PRINIVIL,ZESTRIL) 10 MG tablet take 1 tablet by mouth twice a day 10/22/15  Yes Einar Pheasant, MD  metoprolol succinate (TOPROL-XL) 50 MG 24 hr tablet Take 50 mg by mouth daily. 02/15/14  Yes Historical Provider, MD  mirtazapine (REMERON) 30 MG tablet Take 1 tablet (30 mg total) by mouth at bedtime. 08/21/15  Yes Rainey Pines, MD  Multiple Vitamins-Minerals (ICAPS) CAPS Take by mouth 2 (two) times daily.    Yes Historical Provider, MD  PARoxetine (PAXIL) 10 MG tablet Take 1 tablet (10 mg total) by mouth daily. Patient taking differently: Take 5 mg by mouth daily.  08/21/15  Yes Rainey Pines, MD  sorbitol 70 % SOLN use if needed 07/11/15  Yes Einar Pheasant, MD  calcium carbonate (TUMS EX) 750 MG chewable tablet Chew 1 tablet by mouth 2 (two) times daily.    Historical Provider, MD      PHYSICAL EXAMINATION:   VITAL SIGNS: Blood pressure (!) 162/88, pulse (!) 58, temperature 97.9 F (36.6 C), temperature source Oral, resp. rate 13, height 5\' 6"  (1.676 m), weight 62.1 kg (137 lb), SpO2 98 %.  GENERAL:  82 y.o.-year-old patient lying in the bed with no acute distress.  EYES: Pupils equal, round, reactive to light and accommodation. No scleral icterus. Extraocular muscles intact.  HEENT: Head atraumatic, normocephalic. Oropharynx and nasopharynx clear.  NECK:  Supple, no jugular venous distention. No thyroid enlargement, no tenderness.  LUNGS: Normal breath sounds bilaterally, no wheezing, rales,rhonchi or crepitation. No use of accessory muscles of respiration.  CARDIOVASCULAR: S1, S2 normal. No murmurs, rubs, or gallops.  ABDOMEN: Soft, nontender,Mild distended. Bowel sounds  present. No organomegaly or mass.  EXTREMITIES: No pedal edema, cyanosis, or clubbing. No significant tenderness appreciated in the lower back NEUROLOGIC: Cranial nerves II through XII are intact. Muscle strength 5/5 in all extremities. Sensation intact. Gait not checked.  PSYCHIATRIC: The patient is alert and oriented x 3.  SKIN: No obvious rash, lesion, or ulcer.   LABORATORY PANEL:   CBC  Recent Labs Lab 11/04/15 1420  WBC 10.2  HGB 12.5  HCT 34.8*  PLT 215  MCV 88.6  MCH 31.8  MCHC 35.9  RDW 14.2   ------------------------------------------------------------------------------------------------------------------  Chemistries   Recent Labs Lab 11/04/15 1420  NA 124*  K 4.0  CL 91*  CO2 24  GLUCOSE 115*  BUN 14  CREATININE 0.53  CALCIUM 8.4*  AST 30  ALT 16  ALKPHOS 94  BILITOT 0.8   ------------------------------------------------------------------------------------------------------------------  estimated creatinine clearance is 41.1 mL/min (by C-G formula based on SCr of 0.8 mg/dL). ------------------------------------------------------------------------------------------------------------------ No results for input(s): TSH, T4TOTAL, T3FREE, THYROIDAB in the last 72 hours.  Invalid input(s): FREET3   Coagulation profile No results for input(s): INR, PROTIME in the last 168 hours. ------------------------------------------------------------------------------------------------------------------- No results for input(s): DDIMER in the last 72 hours. -------------------------------------------------------------------------------------------------------------------  Cardiac Enzymes No results for input(s): CKMB, TROPONINI, MYOGLOBIN in the last 168 hours.  Invalid input(s): CK ------------------------------------------------------------------------------------------------------------------ Invalid input(s):  POCBNP  ---------------------------------------------------------------------------------------------------------------  Urinalysis    Component Value Date/Time   COLORURINE YELLOW (A) 11/04/2015 1737   APPEARANCEUR TURBID (A) 11/04/2015 1737   APPEARANCEUR Hazy 02/02/2014 1608   LABSPEC 1.015 11/04/2015 1737   LABSPEC 1.012 02/02/2014 1608   PHURINE 6.0 11/04/2015 1737   GLUCOSEU NEGATIVE 11/04/2015 1737   GLUCOSEU Negative 02/02/2014 1608   HGBUR NEGATIVE 11/04/2015 1737   BILIRUBINUR NEGATIVE 11/04/2015 1737   BILIRUBINUR neg 09/19/2014 1117   BILIRUBINUR Negative 02/02/2014 1608   KETONESUR 1+ (A) 11/04/2015 1737   PROTEINUR 100 (A) 11/04/2015 1737   UROBILINOGEN 0.2 09/19/2014 1117   NITRITE POSITIVE (A) 11/04/2015 1737   LEUKOCYTESUR 3+ (A) 11/04/2015 1737   LEUKOCYTESUR 3+ 02/02/2014 1608     RADIOLOGY: Ct Abdomen Pelvis W Contrast  Result Date: 11/04/2015 CLINICAL DATA:  Right lower quadrant abdominal pain for 3 days. EXAM: CT ABDOMEN AND PELVIS WITH CONTRAST TECHNIQUE: Multidetector CT imaging of the abdomen and pelvis was performed using the standard protocol following bolus administration of intravenous contrast. CONTRAST:  103mL ISOVUE-300 IOPAMIDOL (ISOVUE-300) INJECTION 61% COMPARISON:  02/02/2014 FINDINGS: Lower chest: Chronic basilar scarring changes. 5 mm nodule at the left lung base. The heart is enlarged but stable. No pericardial effusion. The distal esophagus is grossly normal. Hepatobiliary: No focal hepatic lesions. No intrahepatic biliary dilatation. The gallbladder is surgically absent. Mild associated common bile duct dilatation. Pancreas: No mass, inflammation or ductal dilatation. Spleen: Normal size.  No focal lesions. Adrenals/Urinary Tract: The adrenal glands are normal. Small bilateral renal cysts. No worrisome renal lesions, hydronephrosis or renal or obstructing ureteral calculi. No bladder calculi. Stomach/Bowel: The stomach, duodenum, small bowel and  colon are grossly normal. No inflammatory changes, mass lesions or obstructive findings. The appendix is normal. The terminal ileum is normal. Vascular/Lymphatic: No mesenteric or retroperitoneal mass or lymphadenopathy. There is marked tortuosity, ectasia and calcification of the abdominal aorta and branch vessels. No focal aneurysm or dissection. The major venous structures are patent. A duplicated IVC is noted. Other: No ascites or abdominal wall hernia. Musculoskeletal: No significant bony findings. There are numerous remote compression fractures involving the lumbar spine with vertebral augmentation changes noted at L5. IMPRESSION: No acute abdominal/ pelvic findings, mass lesions or lymphadenopathy. Numerous renal cysts but no worrisome renal lesions or obstructive findings. Scarring changes involving the left kidney. Tortuous ectatic and calcified abdominal aorta and branch vessels. The bladder wall is slightly thickened. Possible mild inflammation could suggest cystitis. Small ovarian cysts. Numerous lumbar compression fractures, stable. Electronically Signed   By: Marijo Sanes M.D.   On: 11/04/2015 17:00   EKG: Orders placed or performed during the hospital encounter of 03/05/15  . EKG 12-Lead  . EKG 12-Lead    IMPRESSION AND PLAN: Patient is a 82 year old presenting with back pain  1. UTI We'll treat with ceftriaxone Await urine cultures  2. Back pain Suspect musculoskeletal no evidence of pyelonephritis on CT Does have history of back surgery as well as lumbar fractures noted We will treat patient  with Medrol Dosepak Place her on Flexeril  3. Constipation We'll start patient on stool softeners  4. Essential hypertension We'll continue her home regimen Add when necessary IV hydralazine  5. GERD we'll continue PPI  6. Miscellaneous Lovenox for DVT prophylaxis   All the records are reviewed and case discussed with ED provider. Management plans discussed with the patient,  family and they are in agreement.  CODE STATUS:    Code Status Orders        Start     Ordered   11/04/15 1855  Full code  Continuous     11/04/15 1855    Code Status History    Date Active Date Inactive Code Status Order ID Comments User Context   03/07/2015  1:19 PM 03/07/2015  5:55 PM Full Code [id number]  Isaias Cowman, MD Inpatient    Advance Directive Documentation   Flowsheet Row Most Recent Value  Type of Advance Directive  Living will, Healthcare Power of Attorney  Pre-existing out of facility DNR order (yellow form or pink MOST form)  No data  "MOST" Form in Place?  No data       TOTAL TIME TAKING CARE OF THIS PATIENT: 45 minutes.    Dustin Flock M.D on 11/04/2015 at 6:58 PM  Between 7am to 6pm - Pager - [phone]  After 6pm go to [email or web address] - password [email or web address] [facility]  Office  [phone]  CC: Primary care physician; Einar Pheasant, MD

## 2015-11-04 NOTE — ED Triage Notes (Signed)
Pt presents to ED via EMS from [facility] c/o back "spasms" that started last Thursday. Pt states she had tried to take her shirt off and thinks she pulled something. Pt seen at [facility] clinic Friday and had 2 x-rays done and was sent home with vicodin. States vicodin has not helped pain at all.   Pt also noted to have distention to abdomen but abd is soft, nontender, BS audible x4, pt states last BM was this morning. States that she normally does have some distention but that it is somewhat worse today and thinks the vicodin has made it worse.

## 2015-11-04 NOTE — ED Notes (Signed)
Pt returned from CT °

## 2015-11-05 LAB — CBC
HEMATOCRIT: 34.2 % — AB (ref 35.0–47.0)
HEMOGLOBIN: 12.1 g/dL (ref 12.0–16.0)
MCH: 31.1 pg (ref 26.0–34.0)
MCHC: 35.5 g/dL (ref 32.0–36.0)
MCV: 87.7 fL (ref 80.0–100.0)
Platelets: 206 10*3/uL (ref 150–440)
RBC: 3.9 MIL/uL (ref 3.80–5.20)
RDW: 13.8 % (ref 11.5–14.5)
WBC: 9.6 10*3/uL (ref 3.6–11.0)

## 2015-11-05 LAB — BASIC METABOLIC PANEL
Anion gap: 9 (ref 5–15)
BUN: 11 mg/dL (ref 6–20)
CHLORIDE: 92 mmol/L — AB (ref 101–111)
CO2: 23 mmol/L (ref 22–32)
CREATININE: 0.49 mg/dL (ref 0.44–1.00)
Calcium: 7.9 mg/dL — ABNORMAL LOW (ref 8.9–10.3)
GFR calc non Af Amer: >60 mL/min (ref >60)
Glucose, Bld: 106 mg/dL — ABNORMAL HIGH (ref 65–99)
POTASSIUM: 2.9 mmol/L — AB (ref 3.5–5.1)
Sodium: 124 mmol/L — ABNORMAL LOW (ref 135–145)

## 2015-11-05 MED ORDER — POTASSIUM CHLORIDE CRYS ER 20 MEQ PO TBCR
20.0000 meq | EXTENDED_RELEASE_TABLET | Freq: Two times a day (BID) | ORAL | Status: DC
  Administered 2015-11-05 – 2015-11-09 (×8): 20 meq via ORAL
  Filled 2015-11-05 (×8): qty 1

## 2015-11-05 MED ORDER — PHENAZOPYRIDINE HCL 100 MG PO TABS
100.0000 mg | ORAL_TABLET | Freq: Three times a day (TID) | ORAL | Status: DC
  Administered 2015-11-05 – 2015-11-09 (×13): 100 mg via ORAL
  Filled 2015-11-05 (×11): qty 1

## 2015-11-05 MED ORDER — SODIUM CHLORIDE 0.9 % IV SOLN
INTRAVENOUS | Status: DC
  Administered 2015-11-05 – 2015-11-06 (×2): via INTRAVENOUS

## 2015-11-05 MED ORDER — ENOXAPARIN SODIUM 40 MG/0.4ML ~~LOC~~ SOLN
40.0000 mg | SUBCUTANEOUS | Status: DC
  Administered 2015-11-05 – 2015-11-08 (×4): 40 mg via SUBCUTANEOUS
  Filled 2015-11-05 (×4): qty 0.4

## 2015-11-05 MED ORDER — POTASSIUM CHLORIDE CRYS ER 20 MEQ PO TBCR
40.0000 meq | EXTENDED_RELEASE_TABLET | Freq: Once | ORAL | Status: AC
  Administered 2015-11-05: 40 meq via ORAL
  Filled 2015-11-05: qty 2

## 2015-11-05 NOTE — Progress Notes (Signed)
[facility] NAME: Lori Henson    MR#:  [id number]  DATE OF BIRTH:  03-22-1920  SUBJECTIVE:   She is still complaining of some lower back pain and spasms. No fever, chills, nausea, vomiting. Patient wants to get up and ambulate.  REVIEW OF SYSTEMS:    Review of Systems  Constitutional: Negative for chills and fever.  HENT: Negative for congestion and tinnitus.   Eyes: Negative for blurred vision and double vision.  Respiratory: Negative for cough, shortness of breath and wheezing.   Cardiovascular: Negative for chest pain, orthopnea and PND.  Gastrointestinal: Negative for abdominal pain, diarrhea, nausea and vomiting.  Genitourinary: Positive for dysuria. Negative for hematuria.  Neurological: Negative for dizziness, sensory change and focal weakness.  All other systems reviewed and are negative.   Nutrition: Regular Tolerating Diet: yes Tolerating PT: Await Eval   DRUG ALLERGIES:   Allergies  Allergen Reactions  . Macrobid [Nitrofurantoin Macrocrystal] Itching  . Nitrofurantoin Itching and Other (See Comments)  . Ranitidine Hcl Diarrhea  . Zantac [Ranitidine Hcl] Diarrhea    VITALS:  Blood pressure (!) 162/97, pulse (!) 120, temperature 97.7 F (36.5 C), temperature source Oral, resp. rate 16, height 5\' 6"  (1.676 m), weight 62 kg (136 lb 11.2 oz), SpO2 95 %.  PHYSICAL EXAMINATION:   Physical Exam  GENERAL:  82 y.o.-year-old patient lying in the bed in no acute distress.  EYES: Pupils equal, round, reactive to light and accommodation. No scleral icterus. Extraocular muscles intact.  HEENT: Head atraumatic, normocephalic. Oropharynx and nasopharynx clear.  NECK:  Supple, no jugular venous distention. No thyroid enlargement, no tenderness.  LUNGS: Normal breath sounds bilaterally, no wheezing, rales, rhonchi. No use of accessory muscles of respiration.  CARDIOVASCULAR: S1, S2 normal. No murmurs, rubs, or gallops.   ABDOMEN: Soft, nontender, nondistended. Bowel sounds present. No organomegaly or mass.  EXTREMITIES: No cyanosis, clubbing or edema b/l.    NEUROLOGIC: Cranial nerves II through XII are intact. No focal Motor or sensory deficits b/l. Globally weak.    PSYCHIATRIC: The patient is alert and oriented x 3.  SKIN: No obvious rash, lesion, or ulcer.    LABORATORY PANEL:   CBC  Recent Labs Lab 11/05/15 0406  WBC 9.6  HGB 12.1  HCT 34.2*  PLT 206   ------------------------------------------------------------------------------------------------------------------  Chemistries   Recent Labs Lab 11/04/15 1420 11/05/15 0406  NA 124* 124*  K 4.0 2.9*  CL 91* 92*  CO2 24 23  GLUCOSE 115* 106*  BUN 14 11  CREATININE 0.53 0.49  CALCIUM 8.4* 7.9*  AST 30  --   ALT 16  --   ALKPHOS 94  --   BILITOT 0.8  --    ------------------------------------------------------------------------------------------------------------------  Cardiac Enzymes No results for input(s): TROPONINI in the last 168 hours. ------------------------------------------------------------------------------------------------------------------  RADIOLOGY:  Ct Abdomen Pelvis W Contrast  Result Date: 11/04/2015 CLINICAL DATA:  Right lower quadrant abdominal pain for 3 days. EXAM: CT ABDOMEN AND PELVIS WITH CONTRAST TECHNIQUE: Multidetector CT imaging of the abdomen and pelvis was performed using the standard protocol following bolus administration of intravenous contrast. CONTRAST:  27mL ISOVUE-300 IOPAMIDOL (ISOVUE-300) INJECTION 61% COMPARISON:  02/02/2014 FINDINGS: Lower chest: Chronic basilar scarring changes. 5 mm nodule at the left lung base. The heart is enlarged but stable. No pericardial effusion. The distal esophagus is grossly normal. Hepatobiliary: No focal hepatic lesions. No intrahepatic biliary dilatation. The gallbladder is surgically absent. Mild associated common bile duct dilatation. Pancreas:  No mass,  inflammation or ductal dilatation. Spleen: Normal size.  No focal lesions. Adrenals/Urinary Tract: The adrenal glands are normal. Small bilateral renal cysts. No worrisome renal lesions, hydronephrosis or renal or obstructing ureteral calculi. No bladder calculi. Stomach/Bowel: The stomach, duodenum, small bowel and colon are grossly normal. No inflammatory changes, mass lesions or obstructive findings. The appendix is normal. The terminal ileum is normal. Vascular/Lymphatic: No mesenteric or retroperitoneal mass or lymphadenopathy. There is marked tortuosity, ectasia and calcification of the abdominal aorta and branch vessels. No focal aneurysm or dissection. The major venous structures are patent. A duplicated IVC is noted. Other: No ascites or abdominal wall hernia. Musculoskeletal: No significant bony findings. There are numerous remote compression fractures involving the lumbar spine with vertebral augmentation changes noted at L5. IMPRESSION: No acute abdominal/ pelvic findings, mass lesions or lymphadenopathy. Numerous renal cysts but no worrisome renal lesions or obstructive findings. Scarring changes involving the left kidney. Tortuous ectatic and calcified abdominal aorta and branch vessels. The bladder wall is slightly thickened. Possible mild inflammation could suggest cystitis. Small ovarian cysts. Numerous lumbar compression fractures, stable. Electronically Signed   By: Marijo Sanes M.D.   On: 11/04/2015 17:00    ASSESSMENT AND PLAN:   82 yo female w/ hx of HTN, GERD, a. Fib/flutter s/p pacemaker, hx of Melanoma, who presented to the hospital due to back pain, abdominal pain and noted to have urinary tract infection.  1. Urinary tract infection-continue IV ceftriaxone, follow urine cultures. -Currently afebrile and hemodynamically stable. -We'll add some Pyridium for the bladder spasms.  2. Hyponatremia-likely hypotonic hypovolemic in nature. -I'll start her on some gentle IV fluids,  follow sodium. Check a serum and urine osmolality along with a random urine sodium.  3. Back pain-etiology unclear. Likely musculoskeletal in nature. -Continue Flexeril, Medrol dosepak for now.\ -We'll get physical therapy evaluation.  4. Essential hypertension-continue lisinopril, metoprolol  5. Anxiety-continue Paxil   All the records are reviewed and case discussed with Care Management/Social Workerr. Management plans discussed with the patient, family and they are in agreement.  CODE STATUS: Full Code  DVT Prophylaxis: Hep. SQ  TOTAL TIME TAKING CARE OF THIS PATIENT: 30 minutes.   POSSIBLE D/C IN 1-2 DAYS, DEPENDING ON CLINICAL CONDITION.   Henreitta Leber M.D on 11/05/2015 at 1:56 PM  Between 7am to 6pm - Pager - [phone]  After 6pm go to [email or web address] - password [email or web address]  Office  [phone]  CC: Primary care physician; Einar Pheasant, MD

## 2015-11-05 NOTE — Care Management Note (Signed)
Case Management Note  Patient Details  Name: Lori Henson MRN: [id number] Date of Birth: 1919-08-09  Subjective/Objective: Spoke with patient for discharge planning. Patent is alert and answers questions appropriately. Patient live in a seniors apartment alone. She stated that she uses her walker wherever she goes. Patient has Rolator. She stated that her son is coming here from [facility] to stay with her for a few days after discharge. No Cm needs identified.              Action/Plan: Discharge home with self care.   Expected Discharge Date:                  Expected Discharge Plan:  Home/Self Care  In-House Referral:     Discharge planning Services  CM Consult  Post Acute Care Choice:    Choice offered to:     DME Arranged:    DME Agency:     HH Arranged:    [place] Agency:     Status of Service:  Completed, signed off  If discussed at [employer] of Stay Meetings, dates discussed:    Additional Comments:  Alvie Heidelberg, RN 11/05/2015, 2:12 PM

## 2015-11-05 NOTE — Progress Notes (Signed)
Physical Therapy Evaluation Patient Details Name: Lori Henson MRN: [id number] DOB: May 16, 1919 Today's Date: 11/05/2015   History of Present Illness  Pt is a 82 y/o female admitted for back pain. Per pt notes, pt reports that she felt as though she may have pulled something in back when trying to put shirt on. Since that incident she has felt spasms in her R lower back when moving into vertain positions. PMh includes HTN, osteaoarthritias, and a history of back surgery.   Clinical Impression  Pt is a pleasant and motivated 82 y/o female presenting with difficulty walking and generalized weakness. PLOF: Pt was independent at ILF with use of 4WRW. Pt reports the farthest she needs to ambulate is to the [employer], pt reports difficulty with pushing her RW forward. Pt in min assist with bed mobility, transfers, and ambulation. Pt requires verbal cueing for proper hand placement with sit/stand transfer. Verbal cueing to keep RW close to body and to take bigger steps (increase step length). Pt HR 112 bpm post ambulation. Pt requires verbal and tactile cueing to initiate sit to supine transfer. Pt reports R lower back pain with bed mobility, pillows placed under knees and back to relieve pain at end of session. Pt will benefit from skilled PT in order to improve strength, balance, functional mobility, and safety with DME use. Pt would recommend SNF, but to due pt's observation status, 24 hour assistance in ILF will be appropriate to maintain pt safety.    Follow Up Recommendations Home health PT;Supervision/Assistance - 24 hour (PT in ILF)    Equipment Recommendations  3in1 (PT) (BSC)    Recommendations for Other Services       Precautions / Restrictions Precautions Precautions: Fall Restrictions Weight Bearing Restrictions: No      Mobility  Bed Mobility Overal bed mobility: Needs Assistance Bed Mobility: Sit to Supine       Sit to supine: Min assist   General bed mobility comments:  Pt requires verbal and tactile cueing to initiate sit to supine movement. PT assistance to swing LE on bed. Pt reports back pain with movement, pillows placed under knees and back to relieve pain.  Transfers Overall transfer level: Needs assistance Equipment used: Rolling walker (2 wheeled) Transfers: Sit to/from Stand Sit to Stand: Min assist         General transfer comment: Pt requires verbal cueing to push off from armrests with sit/stand transfer. Pt reports no back pain with transfer.   Ambulation/Gait Ambulation/Gait assistance: Min assist Ambulation Distance (Feet): 30 Feet Assistive device: Rolling walker (2 wheeled) Gait Pattern/deviations: Step-to pattern;Decreased step length - right;Decreased step length - left;Decreased stride length;Trunk flexed   Gait velocity interpretation: Below normal speed for age/gender General Gait Details: Pt demonstrates step-to gait pattern with 2WRW. Pt requires mod-max verbal and tactile cueing for turning and to keep RW close to body for safety. Pt also requires verbal sueing to pick up feet and increase step length. Pt's HR monitored throughout session, 73-78 bpm throughout ambulation. 112 bpm post ambulation.   Stairs            Wheelchair Mobility    Modified Rankin (Stroke Patients Only)       Balance Overall balance assessment: Needs assistance Sitting-balance support: Bilateral upper extremity supported;Feet supported Sitting balance-Leahy Scale: Normal Sitting balance - Comments: Pt able to maintain sitting balance with B UE support.    Standing balance support: Bilateral upper extremity supported Standing balance-Leahy Scale: Good Standing balance comment: Pt  requires B UE support on RW to maintain standing balance.                             Pertinent Vitals/Pain Pain Assessment: Faces Faces Pain Scale: Hurts even more Pain Location: Lower back Pain Descriptors / Indicators: Spasm Pain  Intervention(s): Monitored during session (pillow under knees at end of session)    Home Living Family/patient expects to be discharged to:: Other (Comment) (Independent living facility)                 Additional Comments: Per pt notes, pt lives at [facility].    Prior Function Level of Independence: Independent with assistive device(s)         Comments: Pt reports she was independent with use of RW at ILF. Pt states she has to walk to [employer] and back which has recently become harder for her. Pt reports difficulty pushing her 4WRW at ILF.     Hand Dominance        Extremity/Trunk Assessment   Upper Extremity Assessment: Generalized weakness (UE strength grossly 4-/5)           Lower Extremity Assessment: Generalized weakness (LE strength grossly 4-/5)         Communication   Communication: No difficulties  Cognition Arousal/Alertness: Awake/alert Behavior During Therapy: WFL for tasks assessed/performed Overall Cognitive Status: Within Functional Limits for tasks assessed                      General Comments      Exercises Other Exercises Other Exercises: Pt taken to restroom to void. Pt requires min verbal cueing for safe sit/stand transfer onto [facility].       Assessment/Plan    PT Assessment Patient needs continued PT services  PT Diagnosis Generalized weakness;Difficulty walking   PT Problem List Decreased strength;Decreased activity tolerance;Decreased balance;Decreased mobility;Decreased coordination;Decreased knowledge of use of DME;Decreased safety awareness  PT Treatment Interventions DME instruction;Gait training;Functional mobility training;Therapeutic activities;Therapeutic exercise;Balance training;Patient/family education   PT Goals (Current goals can be found in the Care Plan section) Acute Rehab PT Goals Patient Stated Goal: To return to facility PT Goal Formulation: With patient Time For Goal Achievement:  11/19/15 Potential to Achieve Goals: Good    Frequency Min 2X/week   Barriers to discharge Decreased caregiver support      Co-evaluation               End of Session Equipment Utilized During Treatment: Gait belt Activity Tolerance: Patient tolerated treatment well Patient left: in bed;with call bell/phone within reach;with bed alarm set;with family/visitor present Nurse Communication: Mobility status (IV beeping)    Functional Assessment Tool Used:  (Clinical judgement) Functional Limitation: Mobility: Walking and moving around Mobility: Walking and Moving Around Current Status [id number]): At least 1 percent but less than 20 percent impaired, limited or restricted Mobility: Walking and Moving Around Goal Status [phone]): At least 1 percent but less than 20 percent impaired, limited or restricted    Time: 1537-1609 PT Time Calculation (min) (ACUTE ONLY): 32 min   Charges:   PT Evaluation $PT Eval Moderate Complexity: 1 Procedure PT Treatments $Therapeutic Activity: 8-22 mins (Assisted patient to commode for toileting.)   PT G Codes:   PT G-Codes **NOT FOR INPATIENT CLASS** Functional Assessment Tool Used:  (Clinical judgement) Functional Limitation: Mobility: Walking and moving around Mobility: Walking and Moving Around Current Status [id number]): At least 1 percent  but less than 20 percent impaired, limited or restricted Mobility: Walking and Moving Around Goal Status [phone]): At least 1 percent but less than 20 percent impaired, limited or restricted    [facility] 11/05/2015, 6:25 PM  Georgina Pillion, SPT [phone]

## 2015-11-06 DIAGNOSIS — K5900 Constipation, unspecified: Secondary | ICD-10-CM | POA: Diagnosis present

## 2015-11-06 DIAGNOSIS — N3289 Other specified disorders of bladder: Secondary | ICD-10-CM | POA: Diagnosis present

## 2015-11-06 DIAGNOSIS — G2581 Restless legs syndrome: Secondary | ICD-10-CM | POA: Diagnosis present

## 2015-11-06 DIAGNOSIS — K219 Gastro-esophageal reflux disease without esophagitis: Secondary | ICD-10-CM | POA: Diagnosis present

## 2015-11-06 DIAGNOSIS — G8929 Other chronic pain: Secondary | ICD-10-CM | POA: Diagnosis present

## 2015-11-06 DIAGNOSIS — Z882 Allergy status to sulfonamides status: Secondary | ICD-10-CM | POA: Diagnosis not present

## 2015-11-06 DIAGNOSIS — F419 Anxiety disorder, unspecified: Secondary | ICD-10-CM | POA: Diagnosis present

## 2015-11-06 DIAGNOSIS — Z888 Allergy status to other drugs, medicaments and biological substances status: Secondary | ICD-10-CM | POA: Diagnosis not present

## 2015-11-06 DIAGNOSIS — E876 Hypokalemia: Secondary | ICD-10-CM | POA: Diagnosis present

## 2015-11-06 DIAGNOSIS — N390 Urinary tract infection, site not specified: Secondary | ICD-10-CM | POA: Diagnosis present

## 2015-11-06 DIAGNOSIS — I4892 Unspecified atrial flutter: Secondary | ICD-10-CM | POA: Diagnosis present

## 2015-11-06 DIAGNOSIS — M8088XA Other osteoporosis with current pathological fracture, vertebra(e), initial encounter for fracture: Secondary | ICD-10-CM | POA: Diagnosis present

## 2015-11-06 DIAGNOSIS — I10 Essential (primary) hypertension: Secondary | ICD-10-CM | POA: Diagnosis present

## 2015-11-06 DIAGNOSIS — Z7982 Long term (current) use of aspirin: Secondary | ICD-10-CM | POA: Diagnosis not present

## 2015-11-06 DIAGNOSIS — I4891 Unspecified atrial fibrillation: Secondary | ICD-10-CM | POA: Diagnosis present

## 2015-11-06 DIAGNOSIS — Z950 Presence of cardiac pacemaker: Secondary | ICD-10-CM | POA: Diagnosis not present

## 2015-11-06 DIAGNOSIS — Z85820 Personal history of malignant melanoma of skin: Secondary | ICD-10-CM | POA: Diagnosis not present

## 2015-11-06 DIAGNOSIS — B9620 Unspecified Escherichia coli [E. coli] as the cause of diseases classified elsewhere: Secondary | ICD-10-CM | POA: Diagnosis present

## 2015-11-06 DIAGNOSIS — E871 Hypo-osmolality and hyponatremia: Secondary | ICD-10-CM | POA: Diagnosis present

## 2015-11-06 DIAGNOSIS — E222 Syndrome of inappropriate secretion of antidiuretic hormone: Secondary | ICD-10-CM | POA: Diagnosis present

## 2015-11-06 DIAGNOSIS — E861 Hypovolemia: Secondary | ICD-10-CM | POA: Diagnosis present

## 2015-11-06 DIAGNOSIS — Z66 Do not resuscitate: Secondary | ICD-10-CM | POA: Diagnosis present

## 2015-11-06 LAB — OSMOLALITY: Osmolality: 254 mOsm/kg — ABNORMAL LOW (ref 275–295)

## 2015-11-06 LAB — BASIC METABOLIC PANEL
ANION GAP: 6 (ref 5–15)
BUN: 11 mg/dL (ref 6–20)
CHLORIDE: 90 mmol/L — AB (ref 101–111)
CO2: 24 mmol/L (ref 22–32)
Calcium: 7.6 mg/dL — ABNORMAL LOW (ref 8.9–10.3)
Creatinine, Ser: 0.48 mg/dL (ref 0.44–1.00)
GFR calc Af Amer: >60 mL/min (ref >60)
GFR calc non Af Amer: >60 mL/min (ref >60)
GLUCOSE: 124 mg/dL — AB (ref 65–99)
POTASSIUM: 2.9 mmol/L — AB (ref 3.5–5.1)
Sodium: 120 mmol/L — ABNORMAL LOW (ref 135–145)

## 2015-11-06 LAB — SODIUM: Sodium: 121 mmol/L — ABNORMAL LOW (ref 135–145)

## 2015-11-06 NOTE — Care Management Note (Signed)
Case Management Note  Patient Details  Name: Lori Henson MRN: [id number] Date of Birth: 11-May-1919  Subjective Objective:   Spoke with patient for continued assessment after discussing case with Lori Henson. Patient lives at independent living at [place].  She again has a bedside commode and also a walker and stated that she never walks without a walker. She also has a Corporate investment banker. Contacted the patients son Lori Henson who is in [place]. Patient was up for possible discharge this date but repeat labs contraindicated discharge. Son will be coming down to pick patient up at time of discharge and would like to be notified when discharge is pending. Patient has anxiety about discharging back to independent living. Discussed changing over to assisted living in the near future with patient and son. Patient does have a aid that comes to her home 2 x weekly to help her with chores and bathing. Patient given choice of [place] agencies and would like [facility]. Referral placed for PT RN Aid and CSW. Discussed the need for extra help in the home with pateint s son.     Action/Plan:Anticipated discharge in 1-2 days with HH.   Expected Discharge Date:                  Expected Discharge Plan:  [place]  In-House Referral:     Discharge planning Services  CM Consult  Post Acute Care Choice:    Choice offered to:     DME Arranged:  N/A, Other see comment (Has DME [place] and BSC at home.) DME Agency:  [place]:  RN, PT, Nurse's Aide, Social Work [employer] Agency:  [place]  Status of Service:  Completed, signed off  If discussed at [employer] of [employer], dates discussed:    Additional Comments:  Lori Heidelberg, RN 11/06/2015, 10:54 AM

## 2015-11-06 NOTE — Clinical Social Work Note (Signed)
Clinical Social Work Assessment  Patient Details  Name: JAIMEY FRANCHINI MRN: [id number] Date of Birth: 04/30/1919  Date of referral:  11/06/15               Reason for consult:  Facility Placement                Permission sought to share information with:  Chartered certified accountant granted to share information::  Yes, Verbal Permission Granted  Name::      [place]::   [place]   Relationship::     Contact Information:     Housing/Transportation Living arrangements for the past 2 months:  [place] of Information:  Patient, Adult Children, Friend/Neighbor Patient Interpreter Needed:  None Criminal Activity/Legal Involvement Pertinent to Current Situation/Hospitalization:  No - Comment as needed Significant Relationships:  Adult Children, Friend Lives with:  Self Do you feel safe going back to the place where you live?  Yes Need for family participation in patient care:  Yes (Comment)  Care giving concerns:  Patient lives at [facility] in [place].    Social Worker assessment / plan:  Holiday representative (CSW) received verbal consult from PT that recommendation is SNF. Patient changed from observation to inpatient today 11/06/15. CSW met with patient and her friend Barnetta Chapel was at bedside. CSW introduced self and explained role of CSW department. Patient was alert and oriented and was sitting up in the chair. Per patient she lives alone at a retirement community attached to [place]. CSW explained that PT is recommending SNF and that Medicare will cover SNF if patient has a 3 night inpatient qualifying stay. Patient and her friend verbalized their understanding. Patient prefers [facility].   FL2 complete and faxed out. CSW contacted patient's daughter Vivi Martens in [place]. Per daughter patient's husband passed away 3 months ago and the family has been concerned what patient is going to do in  the long term. Daughter is agreeable to SNF search and thanked CSW for assistance.   Employment status:  Retired Forensic scientist:  Medicare PT Recommendations:  [place] / Referral to community resources:  [place]  Patient/Family's Response to care:  Patient and daughter are agreeable to [facility].   Patient/Family's Understanding of and Emotional Response to Diagnosis, Current Treatment, and Prognosis:  Patient and daughter were very pleasant and thanked CSW for assistance.   Emotional Assessment Appearance:  Appears stated age Attitude/Demeanor/Rapport:    Affect (typically observed):  Accepting, Adaptable, Pleasant Orientation:  Oriented to Self, Oriented to Place, Oriented to  Time, Oriented to Situation Alcohol / Substance use:  Not Applicable Psych involvement (Current and /or in the community):  No (Comment)  Discharge Needs  Concerns to be addressed:  Discharge Planning Concerns Readmission within the last 30 days:  No Current discharge risk:  Dependent with Mobility Barriers to Discharge:  Continued Medical Work up   [employer], Veronia Beets, LCSW 11/06/2015, 4:07 PM

## 2015-11-06 NOTE — Progress Notes (Signed)
Physical Therapy Treatment Patient Details Name: Lori Henson MRN: [id number] DOB: [birth year] Today's Date: 11/06/2015    History of Present Illness Pt is a 82 y/o female admitted for back pain. Per pt notes, pt reports that she felt as though she may have pulled something in back when trying to put shirt on. Since that incident she has felt spasms in her R lower back when moving into vertain positions. PMh includes HTN, osteaoarthritias, and a history of back surgery.     PT Comments    Pt vitals at beginning of session: BP-166/76 HR-70 bpm. Pt requires min assist for bed mobility, and transfers, and for ambulation. Pt demonstrates good sitting balance with UE and LE support with anterior/posterior perturbations but increased sway with lateral perturbations. Pt continues to have trouble with sit/stand transfer requiring verbal and tactile cueing for safe UE placement on RW and reinforcement to push through RW, not to pull on RW. Pt requires moderate verbal cueing to increase gait speed and step length. Requires tactile cueing to initiate moving RW away from obstacles. HR monitored with ambulation; 66-96 bpm. Pt will continue to benefit from PT in order to improve endurance, strength, functional mobility, and safety with DME use. At this time pt is most appropriate for STR to address strength and endurance deficits.  Follow Up Recommendations  SNF     Equipment Recommendations  3in1 (PT) [facility])    Recommendations for Other Services       Precautions / Restrictions Precautions Precautions: Fall Restrictions Weight Bearing Restrictions: No    Mobility  Bed Mobility Overal bed mobility: Needs Assistance Bed Mobility: Supine to Sit     Supine to sit: Min assist     General bed mobility comments: Pt requires verbal and tactile cueing to initiate supine to sit movement and to scoot to EOB. PT assistance to swing LE off EOB. Pt reports slight back pain with movement, can work through it.    Transfers Overall transfer level: Needs assistance Equipment used: Rolling walker (2 wheeled) Transfers: Sit to/from Stand Sit to Stand: Min assist         General transfer comment: Bed elevated to assist with transfer. Verbal cueing for safe transfer technique with proper UE placement. Pt was slightly unsteady upon standing, was able to steady herself with B UE support on RW.  Ambulation/Gait Ambulation/Gait assistance: Min assist Ambulation Distance (Feet): 30 Feet Assistive device: Rolling walker (2 wheeled) Gait Pattern/deviations: Step-to pattern;Decreased step length - right;Decreased step length - left;Decreased stride length;Trunk flexed   Gait velocity interpretation: Below normal speed for age/gender General Gait Details: Pt demonstrates decreased gait speed with step-to gait pattern. Continues to require constant verbal cueing to pick up feet and increase step length. Verbal and tactile cueing to keep RW close to body for safety. Pt demonstrates slight SOB with ambulation. HR monitored throughout session, 66-96 bpm.    Stairs            Wheelchair Mobility    Modified Rankin (Stroke Patients Only)       Balance Overall balance assessment: Needs assistance Sitting-balance support: No upper extremity supported;Feet supported Sitting balance-Leahy Scale: Normal Sitting balance - Comments: Pt able to maintain sitting balance with B UE support. Pt able to maintain sitting balance with pertubations in all directions.   Standing balance support: Bilateral upper extremity supported Standing balance-Leahy Scale: Good Standing balance comment: Pt requires RW to maintain standing balance.  Cognition Arousal/Alertness: Awake/alert Behavior During Therapy: WFL for tasks assessed/performed Overall Cognitive Status: Within Functional Limits for tasks assessed                      Exercises Other Exercises Other Exercises: Supine  ther ex: B ankle pumps, B SLRs, B heel slides x 5 reps with min assist and with verbal and visual cueing for proper technique and to ensure pt did not become distracted. Seated ther ex: B marches and B LAQs x 5 reps (min assist) also performed with verbal and visual cueing for proper technique.    General Comments        Pertinent Vitals/Pain Pain Assessment: Faces Faces Pain Scale: Hurts little more Pain Location: Abdomen Pain Intervention(s): Monitored during session;Limited activity within patient's tolerance    Home Living                      Prior Function            PT Goals (current goals can now be found in the care plan section) Acute Rehab PT Goals Patient Stated Goal: To be discharged somewhere she would have more assistance PT Goal Formulation: With patient Time For Goal Achievement: 11/19/15 Potential to Achieve Goals: Good Progress towards PT goals: Progressing toward goals    Frequency  Min 2X/week    PT Plan Discharge plan needs to be updated    Co-evaluation             End of Session Equipment Utilized During Treatment: Gait belt Activity Tolerance: Patient tolerated treatment well Patient left: in chair;with call bell/phone within reach;with chair alarm set     Time: 1450-1519 PT Time Calculation (min) (ACUTE ONLY): 29 min  Charges:  $Gait Training: 8-22 mins $Therapeutic Exercise: 8-22 mins                    G Codes:      Georgina Pillion Dec 01, 2015, 5:40 PM Georgina Pillion, SPT [phone]

## 2015-11-06 NOTE — Progress Notes (Signed)
[facility] [place] NAME: Lori Henson    MR#:  [id number]  DATE OF BIRTH:  1919-11-02  SUBJECTIVE:   She is still complaining of some weakness, poor by mouth intake. Still noted to be hyponatremic, hypokalemic.   REVIEW OF SYSTEMS:    Review of Systems  Constitutional: Negative for chills and fever.  HENT: Negative for congestion and tinnitus.   Eyes: Negative for blurred vision and double vision.  Respiratory: Negative for cough, shortness of breath and wheezing.   Cardiovascular: Negative for chest pain, orthopnea and PND.  Gastrointestinal: Negative for abdominal pain, diarrhea, nausea and vomiting.  Genitourinary: Negative for dysuria and hematuria.  Neurological: Positive for weakness. Negative for dizziness, sensory change and focal weakness.  All other systems reviewed and are negative.   Nutrition: Regular Tolerating Diet: yes Tolerating PT: PT eval noted.   DRUG ALLERGIES:   Allergies  Allergen Reactions  . Macrobid [Nitrofurantoin Macrocrystal] Itching  . Nitrofurantoin Itching and Other (See Comments)  . Ranitidine Hcl Diarrhea  . Zantac [Ranitidine Hcl] Diarrhea    VITALS:  Blood pressure (!) 160/96, pulse (!) 120, temperature 98.4 F (36.9 C), temperature source Oral, resp. rate 16, height 5\' 6"  (1.676 m), weight 62 kg (136 lb 11.2 oz), SpO2 96 %.  PHYSICAL EXAMINATION:   Physical Exam  GENERAL:  82 y.o.-year-old patient lying in the bed in no acute distress.  EYES: Pupils equal, round, reactive to light and accommodation. No scleral icterus. Extraocular muscles intact.  HEENT: Head atraumatic, normocephalic. Oropharynx and nasopharynx clear.  NECK:  Supple, no jugular venous distention. No thyroid enlargement, no tenderness.  LUNGS: Normal breath sounds bilaterally, no wheezing, rales, rhonchi. No use of accessory muscles of respiration.  CARDIOVASCULAR: S1, S2 normal. No murmurs, rubs, or gallops.  ABDOMEN:  Soft, nontender, nondistended. Bowel sounds present. No organomegaly or mass.  EXTREMITIES: No cyanosis, clubbing or edema b/l.    NEUROLOGIC: Cranial nerves II through XII are intact. No focal Motor or sensory deficits b/l. Globally weak.    PSYCHIATRIC: The patient is alert and oriented x 3.  SKIN: No obvious rash, lesion, or ulcer.    LABORATORY PANEL:   CBC  Recent Labs Lab 11/05/15 0406  WBC 9.6  HGB 12.1  HCT 34.2*  PLT 206   ------------------------------------------------------------------------------------------------------------------  Chemistries   Recent Labs Lab 11/04/15 1420  11/06/15 0917  NA 124*  < > 120*  K 4.0  < > 2.9*  CL 91*  < > 90*  CO2 24  < > 24  GLUCOSE 115*  < > 124*  BUN 14  < > 11  CREATININE 0.53  < > 0.48  CALCIUM 8.4*  < > 7.6*  AST 30  --   --   ALT 16  --   --   ALKPHOS 94  --   --   BILITOT 0.8  --   --   < > = values in this interval not displayed. ------------------------------------------------------------------------------------------------------------------  Cardiac Enzymes No results for input(s): TROPONINI in the last 168 hours. ------------------------------------------------------------------------------------------------------------------  RADIOLOGY:  Ct Abdomen Pelvis W Contrast  Result Date: 11/04/2015 CLINICAL DATA:  Right lower quadrant abdominal pain for 3 days. EXAM: CT ABDOMEN AND PELVIS WITH CONTRAST TECHNIQUE: Multidetector CT imaging of the abdomen and pelvis was performed using the standard protocol following bolus administration of intravenous contrast. CONTRAST:  3mL ISOVUE-300 IOPAMIDOL (ISOVUE-300) INJECTION 61% COMPARISON:  02/02/2014 FINDINGS: Lower chest: Chronic basilar scarring changes. 5  mm nodule at the left lung base. The heart is enlarged but stable. No pericardial effusion. The distal esophagus is grossly normal. Hepatobiliary: No focal hepatic lesions. No intrahepatic biliary dilatation. The  gallbladder is surgically absent. Mild associated common bile duct dilatation. Pancreas: No mass, inflammation or ductal dilatation. Spleen: Normal size.  No focal lesions. Adrenals/Urinary Tract: The adrenal glands are normal. Small bilateral renal cysts. No worrisome renal lesions, hydronephrosis or renal or obstructing ureteral calculi. No bladder calculi. Stomach/Bowel: The stomach, duodenum, small bowel and colon are grossly normal. No inflammatory changes, mass lesions or obstructive findings. The appendix is normal. The terminal ileum is normal. Vascular/Lymphatic: No mesenteric or retroperitoneal mass or lymphadenopathy. There is marked tortuosity, ectasia and calcification of the abdominal aorta and branch vessels. No focal aneurysm or dissection. The major venous structures are patent. A duplicated IVC is noted. Other: No ascites or abdominal wall hernia. Musculoskeletal: No significant bony findings. There are numerous remote compression fractures involving the lumbar spine with vertebral augmentation changes noted at L5. IMPRESSION: No acute abdominal/ pelvic findings, mass lesions or lymphadenopathy. Numerous renal cysts but no worrisome renal lesions or obstructive findings. Scarring changes involving the left kidney. Tortuous ectatic and calcified abdominal aorta and branch vessels. The bladder wall is slightly thickened. Possible mild inflammation could suggest cystitis. Small ovarian cysts. Numerous lumbar compression fractures, stable. Electronically Signed   By: Marijo Sanes M.D.   On: 11/04/2015 17:00    ASSESSMENT AND PLAN:   82 yo female w/ hx of HTN, GERD, a. Fib/flutter s/p pacemaker, hx of Melanoma, who presented to the hospital due to back pain, abdominal pain and noted to have urinary tract infection.  1. Urinary tract infection-continue IV ceftriaxone, And cultures growing 100,000 colonies of gram-negative rod. Identification pending. -Currently afebrile and hemodynamically  stable. -Continue Pyridium.  2. Hyponatremia-etiology unclear. Despite getting some fluids patient's sodium went down. Questionable SIADH. -We'll get a nephrology consult. Check serum and urine osmolality with a urine sodium.  3. Back pain-etiology unclear. Likely musculoskeletal in nature. -Continue Flexeril, Medrol dosepak for now. -PT evaluation noted.  4. Essential hypertension-continue lisinopril, metoprolol  5. Anxiety-continue Paxil  Physical therapy reevaluate patient tomorrow. Possible discharge home with home health versus assisted living depending on progress.   All the records are reviewed and case discussed with Care Management/Social Workerr. Management plans discussed with the patient, family and they are in agreement.  CODE STATUS: Full Code  DVT Prophylaxis: Hep. SQ  TOTAL TIME TAKING CARE OF THIS PATIENT: 30 minutes.   POSSIBLE D/C IN 2-3 DAYS, DEPENDING ON CLINICAL CONDITION.   Henreitta Leber M.D on 11/06/2015 at 2:38 PM  Between 7am to 6pm - Pager - [phone]  After 6pm go to [email or web address] - password [email or web address] [facility]  Office  [phone]  CC: Primary care physician; Einar Pheasant, MD

## 2015-11-06 NOTE — Progress Notes (Signed)
Pt alert and oriented. Still with complaints of back pain. Not ambulating well two assist to bsc.

## 2015-11-06 NOTE — Progress Notes (Signed)
Patient with minimal complaints at this time other than muscle spasms and urine frequency. 1 assist to bsc this shift. Abd is very distended, last recorded bm 7/30, bowel sounds present. Colace given per orders.

## 2015-11-06 NOTE — Consult Note (Signed)
[facility]  CONSULT NOTE    Date: 11/06/2015                  Patient Name:  Lori Henson  MRN: [id number]  DOB: 1919-08-16  Age / Sex: 82 y.o., female         PCP: Einar Pheasant, MD                 Service Requesting Consult: Dr. Verdell Carmine                 Reason for Consult: Hyponatremia            History of Present Illness: Lori Henson is a 82 y.o. white female with hypertension, depression, anxiety, atrial flutter, pacemaker, GERD, lichen planus, who was admitted to [facility] on 11/04/2015 for Hyponatremia [E87.1] UTI (lower urinary tract infection) [N39.0] Lumbar strain, initial encounter [id number]   Patient's sodium baseline of 134. However after IV NS administration, sodium trended down to 120. Nephrology consulted. Patient unable to give much of a history.    Medications: Outpatient medications: Prescriptions Prior to Admission  Medication Sig Dispense Refill Last Dose  . aspirin EC 81 MG tablet Take 81 mg by mouth daily.   11/03/2015 at 1200  . Casanthranol-Docusate Sodium 30-100 MG CAPS Take 1 capsule by mouth as needed.    11/03/2015 at 1800  . Cholecalciferol ([employer] VITAMIN D3) 1000 UNITS capsule Take 1,000 Units by mouth daily.   11/03/2015 at 1200  . lisinopril (PRINIVIL,ZESTRIL) 10 MG tablet take 1 tablet by mouth twice a day 60 tablet 5 11/04/2015 at 0900  . metoprolol succinate (TOPROL-XL) 50 MG 24 hr tablet Take 50 mg by mouth daily.  0 11/04/2015 at 0900  . mirtazapine (REMERON) 30 MG tablet Take 1 tablet (30 mg total) by mouth at bedtime. 90 tablet 1 11/03/2015 at 2300  . Multiple Vitamins-Minerals (ICAPS) CAPS Take by mouth 2 (two) times daily.    11/03/2015 at 1800  . PARoxetine (PAXIL) 10 MG tablet Take 1 tablet (10 mg total) by mouth daily. (Patient taking differently: Take 5 mg by mouth daily. ) 90 tablet 1 11/03/2015 at 2300  . sorbitol 70 % SOLN use if needed 1000 mL 3 prn at prn  . calcium carbonate (TUMS EX) 750 MG chewable tablet Chew  1 tablet by mouth 2 (two) times daily.   prn at prn    Current medications: Current Facility-Administered Medications  Medication Dose Route Frequency Provider Last Rate Last Dose  . acetaminophen (TYLENOL) tablet 650 mg  650 mg Oral Q6H PRN Dustin Flock, MD       Or  . acetaminophen (TYLENOL) suppository 650 mg  650 mg Rectal Q6H PRN Dustin Flock, MD      . aspirin EC tablet 81 mg  81 mg Oral Daily Dustin Flock, MD   81 mg at 11/06/15 [id number]  . calcium carbonate (TUMS - dosed in mg elemental calcium) chewable tablet 200 mg of elemental calcium  1 tablet Oral BID Dustin Flock, MD   200 mg of elemental calcium at 11/06/15 0825  . cefTRIAXone (ROCEPHIN) 1 g in dextrose 5 % 50 mL IVPB  1 g Intravenous Q24H Dustin Flock, MD   1 g at 11/05/15 1745  . cholecalciferol (VITAMIN D) tablet 1,000 Units  1,000 Units Oral Daily Dustin Flock, MD   1,000 Units at 11/06/15 [phone]  . cyclobenzaprine (FLEXERIL) tablet 5 mg  5 mg Oral TID Dustin Flock, MD  5 mg at 11/06/15 0821  . docusate sodium (COLACE) capsule 100 mg  100 mg Oral BID Dustin Flock, MD   100 mg at 11/06/15 0824  . enoxaparin (LOVENOX) injection 40 mg  40 mg Subcutaneous Q24H Henreitta Leber, MD   40 mg at 11/05/15 1744  . HYDROcodone-acetaminophen (NORCO/VICODIN) 5-325 MG per tablet 1-2 tablet  1-2 tablet Oral Q4H PRN Dustin Flock, MD   1 tablet at 11/06/15 0122  . lisinopril (PRINIVIL,ZESTRIL) tablet 10 mg  10 mg Oral BID Dustin Flock, MD   10 mg at 11/06/15 [id number]  . [START ON 11/07/2015] methylPREDNISolone (MEDROL DOSEPAK) tablet 4 mg  4 mg Oral 4X daily taper Lenis Noon, RPH      . methylPREDNISolone (MEDROL DOSEPAK) tablet 8 mg  8 mg Oral Nightly Darylene Price [place], [facility]      . metoprolol succinate (TOPROL-XL) 24 hr tablet 50 mg  50 mg Oral Daily Dustin Flock, MD   50 mg at 11/06/15 0823  . mirtazapine (REMERON) tablet 30 mg  30 mg Oral QHS Dustin Flock, MD   30 mg at 11/05/15 2106  . morphine 2 MG/ML injection 1 mg  1 mg  Intravenous Q4H PRN Dustin Flock, MD      . ondansetron (ZOFRAN) tablet 4 mg  4 mg Oral Q6H PRN Dustin Flock, MD       Or  . ondansetron (ZOFRAN) injection 4 mg  4 mg Intravenous Q6H PRN Dustin Flock, MD      . PARoxetine (PAXIL) tablet 5 mg  5 mg Oral Daily Dustin Flock, MD   5 mg at 11/06/15 0824  . phenazopyridine (PYRIDIUM) tablet 100 mg  100 mg Oral TID WC Henreitta Leber, MD   100 mg at 11/06/15 0825  . potassium chloride SA (K-DUR,KLOR-CON) CR tablet 20 mEq  20 mEq Oral BID Henreitta Leber, MD   20 mEq at 11/06/15 0824  . senna (SENOKOT) tablet 8.6 mg  1 tablet Oral BID Dustin Flock, MD   8.6 mg at 11/06/15 [id number]      Allergies: Allergies  Allergen Reactions  . Macrobid [Nitrofurantoin Macrocrystal] Itching  . Nitrofurantoin Itching and Other (See Comments)  . Ranitidine Hcl Diarrhea  . Zantac [Ranitidine Hcl] Diarrhea      Past Medical History: Past Medical History:  Diagnosis Date  . Angular cheilitis   . Anxiety   . Atrial flutter ([place])   . Bradycardia 2007   s/p pacemaker  . Dysrhythmia   . GERD (gastroesophageal reflux disease)   . Hypertension   . Lichen planus   . Malignant melanoma ([place]) 2001   right leg, s/p radiation, recurrence 2008  . Osteoporosis    fosamax, compression fx s/p kyphoplasty  . Presence of permanent cardiac pacemaker      Past Surgical History: Past Surgical History:  Procedure Laterality Date  . BACK SURGERY    . bladder support/spark sling  2007   s/p anterior repair with pubovaginal sling and cystoscopy  . cataract surgery  1998   x2  . CHOLECYSTECTOMY  2009  . EYE SURGERY Bilateral    Cataract Extraction  . FRACTURE SURGERY Left    fractured left wrist with plate inserted  . HEMORROIDECTOMY    . HERNIA REPAIR  1980  . INSERT / REPLACE / REMOVE PACEMAKER    . KYPHOPLASTY  2007   L-5  . PACEMAKER INSERTION N/A 03/07/2015   Procedure: / pacemaker change out;  Surgeon: Isaias Cowman, MD;  Location: [facility] ORS;   Service: Cardiovascular;  Laterality: N/A;  . PACEMAKER PLACEMENT  2007  . surgery for melanoma    . TONSILLECTOMY    . TONSILLECTOMY AND ADENOIDECTOMY  1933  . TUBAL LIGATION       Family History: Family History  Problem Relation Age of Onset  . Heart disease Mother   . Heart disease Father     died age 93  . Stroke Sister   . Kidney disease Brother   . Uterine cancer Maternal Aunt   . Ovarian cancer      cousin     Social History: Social History   Social History  . Marital status: Widowed    Spouse name: N/A  . Number of children: 4  . Years of education: N/A   Occupational History  . Not on file.   Social History Main Topics  . Smoking status: Never Smoker  . Smokeless tobacco: Never Used  . Alcohol use No  . Drug use: No  . Sexual activity: No   Other Topics Concern  . Not on file   Social History Narrative  . No narrative on file     Review of Systems: Review of Systems  Unable to perform ROS: Age    Vital Signs: Blood pressure (!) 160/96, pulse (!) 120, temperature 98.4 F (36.9 C), temperature source Oral, resp. rate 16, height 5\' 6"  (1.676 m), weight 62 kg (136 lb 11.2 oz), SpO2 96 %.  Weight trends: Filed Weights   11/04/15 1400 11/04/15 2047  Weight: 62.1 kg (137 lb) 62 kg (136 lb 11.2 oz)    Physical Exam: General: NAD, sitting up  Head: Normocephalic, atraumatic. Moist oral mucosal membranes  Eyes: Anicteric, PERRL  Neck: Supple, trachea midline  Lungs:  Clear to auscultation  Heart: Regular rate and rhythm  Abdomen:  Soft, nontender,   Extremities: no peripheral edema.  Neurologic: Nonfocal, moving all four extremities  Skin: No lesions        Lab results: Basic Metabolic Panel:  Recent Labs Lab 11/04/15 1420 11/05/15 0406 11/06/15 0917  NA 124* 124* 120*  K 4.0 2.9* 2.9*  CL 91* 92* 90*  CO2 24 23 24   GLUCOSE 115* 106* 124*  BUN 14 11 11   CREATININE 0.53 0.49 0.48  CALCIUM 8.4* 7.9* 7.6*    Liver Function  Tests:  Recent Labs Lab 11/04/15 1420  AST 30  ALT 16  ALKPHOS 94  BILITOT 0.8  PROT 7.1  ALBUMIN 3.5    Recent Labs Lab 11/04/15 1420  LIPASE 18   No results for input(s): AMMONIA in the last 168 hours.  CBC:  Recent Labs Lab 11/04/15 1420 11/05/15 0406  WBC 10.2 9.6  HGB 12.5 12.1  HCT 34.8* 34.2*  MCV 88.6 87.7  PLT 215 206    Cardiac Enzymes: No results for input(s): CKTOTAL, CKMB, CKMBINDEX, TROPONINI in the last 168 hours.  BNP: Invalid input(s): POCBNP  CBG: No results for input(s): GLUCAP in the last 168 hours.  Microbiology: Results for orders placed or performed during the hospital encounter of 11/04/15  Urine culture     Status: Abnormal (Preliminary result)   Collection Time: 11/04/15  5:37 PM  Result Value Ref Range Status   Specimen Description URINE, RANDOM  Final   Special Requests NONE  Final   Culture >=100,000 COLONIES/mL GRAM NEGATIVE RODS (A)  Final   Report Status PENDING  Incomplete  MRSA PCR Screening     Status: None  Collection Time: 11/04/15  8:15 PM  Result Value Ref Range Status   MRSA by PCR NEGATIVE NEGATIVE Final    Comment:        The GeneXpert MRSA Assay (FDA approved for NASAL specimens only), is one component of a comprehensive MRSA colonization surveillance program. It is not intended to diagnose MRSA infection nor to guide or monitor treatment for MRSA infections.     Coagulation Studies: No results for input(s): LABPROT, INR in the last 72 hours.  Urinalysis:  Recent Labs  11/04/15 1737  COLORURINE YELLOW*  LABSPEC 1.015  PHURINE 6.0  GLUCOSEU NEGATIVE  HGBUR NEGATIVE  BILIRUBINUR NEGATIVE  KETONESUR 1+*  PROTEINUR 100*  NITRITE POSITIVE*  LEUKOCYTESUR 3+*      Imaging: Ct Abdomen Pelvis W Contrast  Result Date: 11/04/2015 CLINICAL DATA:  Right lower quadrant abdominal pain for 3 days. EXAM: CT ABDOMEN AND PELVIS WITH CONTRAST TECHNIQUE: Multidetector CT imaging of the abdomen and  pelvis was performed using the standard protocol following bolus administration of intravenous contrast. CONTRAST:  69mL ISOVUE-300 IOPAMIDOL (ISOVUE-300) INJECTION 61% COMPARISON:  02/02/2014 FINDINGS: Lower chest: Chronic basilar scarring changes. 5 mm nodule at the left lung base. The heart is enlarged but stable. No pericardial effusion. The distal esophagus is grossly normal. Hepatobiliary: No focal hepatic lesions. No intrahepatic biliary dilatation. The gallbladder is surgically absent. Mild associated common bile duct dilatation. Pancreas: No mass, inflammation or ductal dilatation. Spleen: Normal size.  No focal lesions. Adrenals/Urinary Tract: The adrenal glands are normal. Small bilateral renal cysts. No worrisome renal lesions, hydronephrosis or renal or obstructing ureteral calculi. No bladder calculi. Stomach/Bowel: The stomach, duodenum, small bowel and colon are grossly normal. No inflammatory changes, mass lesions or obstructive findings. The appendix is normal. The terminal ileum is normal. Vascular/Lymphatic: No mesenteric or retroperitoneal mass or lymphadenopathy. There is marked tortuosity, ectasia and calcification of the abdominal aorta and branch vessels. No focal aneurysm or dissection. The major venous structures are patent. A duplicated IVC is noted. Other: No ascites or abdominal wall hernia. Musculoskeletal: No significant bony findings. There are numerous remote compression fractures involving the lumbar spine with vertebral augmentation changes noted at L5. IMPRESSION: No acute abdominal/ pelvic findings, mass lesions or lymphadenopathy. Numerous renal cysts but no worrisome renal lesions or obstructive findings. Scarring changes involving the left kidney. Tortuous ectatic and calcified abdominal aorta and branch vessels. The bladder wall is slightly thickened. Possible mild inflammation could suggest cystitis. Small ovarian cysts. Numerous lumbar compression fractures, stable.  Electronically Signed   By: Marijo Sanes M.D.   On: 11/04/2015 17:00     Assessment & Plan: Ms. KAILIN BAKOS is a 82 y.o. white female with hypertension, depression, anxiety, atrial flutter, pacemaker, GERD, lichen planus, who was admitted to [facility] on 11/04/2015   1. Hyponatremia: underlying SIADH? Lower with administration of 0.9 saline.  - fluid restriction - serial sodiums  2. Hypokalemia: did not receive diuretics. Most likely from saline infusion - agree with PO potassium supplementation.   3. Urinary Tract Infection: gram negative rods.  - empiric ceftriaxone  4. Hypertension: elevated - metoprolol and lisinopril.    LOS: [place], [place] 8/1/20172:56 PM

## 2015-11-06 NOTE — Clinical Social Work Placement (Signed)
   CLINICAL SOCIAL WORK PLACEMENT  NOTE  Date:  11/06/2015  Patient Details  Name: Lori Henson MRN: [id number] Date of Birth: Sep 28, 1919  Clinical Social Work is seeking post-discharge placement for this patient at the [place] level of care (*CSW will initial, date and re-position this form in  chart as items are completed):  Yes   Patient/family provided with [place] Work Department's list of facilities offering this level of care within the geographic area requested by the patient (or if unable, by the patient's family).  Yes   Patient/family informed of their freedom to choose among providers that offer the needed level of care, that participate in Medicare, Medicaid or managed care program needed by the patient, have an available bed and are willing to accept the patient.  Yes   Patient/family informed of [facility]'s ownership interest in [facility] and [facility], as well as of the fact that they are under no obligation to receive care at these facilities.  PASRR submitted to EDS on 11/06/15     PASRR number received on 11/06/15     Existing PASRR number confirmed on       FL2 transmitted to all facilities in geographic area requested by pt/family on 11/06/15     FL2 transmitted to all facilities within larger geographic area on       Patient informed that his/her managed care company has contracts with or will negotiate with certain facilities, including the following:            Patient/family informed of bed offers received.  Patient chooses bed at       Physician recommends and patient chooses bed at      Patient to be transferred to   on  .  Patient to be transferred to facility by       Patient family notified on   of transfer.  Name of family member notified:        PHYSICIAN       Additional Comment:    _______________________________________________ Carrin Vannostrand, Veronia Beets, LCSW 11/06/2015, 4:06 PM

## 2015-11-06 NOTE — NC FL2 (Signed)
[place] LEVEL OF CARE SCREENING TOOL     IDENTIFICATION  Patient Name: Lori Henson Birthdate: [birth year] Sex: female Admission Date (Current Location): 11/04/2015  [place] and [place] Number:  Engineering geologist and Address:  [facility], [address], [place], [place] [address]      Provider Number: [id number]  Attending Physician Name and Address:  Henreitta Leber, MD  Relative Name and Phone Number:       Current Level of Care: Hospital Recommended Level of Care: [place] Prior Approval Number:    Date Approved/Denied:   PASRR Number:  ([id number])  Discharge Plan: SNF    Current Diagnoses: Patient Active Problem List   Diagnosis Date Noted  . Rectal bleeding 09/18/2015  . Fall 01/13/2015  . Cough 10/15/2014  . AV junctional bradycardia 07/07/2014  . Gastritis 06/12/2014  . Hair loss 06/12/2014  . Gastric catarrh 06/12/2014  . 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 Lumbar and sacral osteoarthritis 05/10/2014  . Dysphagia 04/09/2014  . Can't get food down 03/24/2014  . Abdominal pain 02/20/2014  . Wound of ankle 02/20/2014  . Open wound of knee, leg (except thigh), and ankle 02/20/2014  . Hair thinning 11/11/2013  . Unsteady gait 11/11/2013  . Back pain 11/11/2013  . Abnormal gait 11/11/2013  . Alopecia 11/11/2013  . Atrial flutter ([place]) 09/05/2013  . Chronic pain 09/05/2013  . Artificial cardiac pacemaker 09/05/2013  . Edema of foot 09/05/2013  . Prolapse of urethra 09/05/2013  . Squamous cell skin cancer 08/23/2013  . Cancer of skin, squamous cell 08/23/2013  . Restless leg syndrome 02/18/2013  . Restless leg 02/18/2013  . Failure to thrive 01/31/2013  . Neoplasm of uncertain behavior [id number]  . Depression 12/05/2012  . Depression, major, single episode 12/05/2012  . Major depressive disorder with single episode ([place]) 12/05/2012  . Hyponatremia  06/07/2012  . Hypo-osmolality and hyponatremia 06/07/2012  . Lower extremity edema 05/11/2012  . Accumulation of fluid in tissues 05/11/2012  . Edema 05/11/2012  . Hypertension 02/07/2012  . Malignant melanoma ([place]) 02/07/2012  . Essential (primary) hypertension 02/07/2012  . Cutaneous malignant melanoma ([place]) 02/07/2012  . Malignant melanoma of skin ([place]) 02/07/2012  . Chronic constipation 02/06/2012  . CN (constipation) 02/06/2012    Orientation RESPIRATION BLADDER Height & Weight     Self, Time, Situation, Place  Normal Continent Weight: 136 lb 11.2 oz (62 kg) Height:  5\' 6"  (167.6 cm)  BEHAVIORAL SYMPTOMS/MOOD NEUROLOGICAL BOWEL NUTRITION STATUS   (none )  (none ) Continent Diet (Regular Diet )  AMBULATORY STATUS COMMUNICATION OF NEEDS Skin   Extensive Assist Verbally Normal                       Personal Care Assistance Level of Assistance  Bathing, Feeding, Dressing Bathing Assistance: Limited assistance Feeding assistance: Independent Dressing Assistance: Limited assistance     Functional Limitations Info  Sight, Hearing, Speech Sight Info: Adequate Hearing Info: Adequate Speech Info: Adequate    SPECIAL CARE FACTORS FREQUENCY  PT (By licensed PT)     PT Frequency:  (5)              Contractures      Additional Factors Info  Code Status, Allergies Code Status Info:  (Full Code. ) Allergies Info:  (Macrobid Nitrofurantoin Macrocrystal, Nitrofurantoin, Ranitidine Hcl, Zantac Ranitidine Hcl)           Current  Medications (11/06/2015):  This is the current hospital active medication list Current Facility-Administered Medications  Medication Dose Route Frequency Provider Last Rate Last Dose  . acetaminophen (TYLENOL) tablet 650 mg  650 mg Oral Q6H PRN Dustin Flock, MD       Or  . acetaminophen (TYLENOL) suppository 650 mg  650 mg Rectal Q6H PRN Dustin Flock, MD      . aspirin EC tablet 81 mg  81 mg Oral Daily Dustin Flock, MD   81 mg at  11/06/15 [id number]  . calcium carbonate (TUMS - dosed in mg elemental calcium) chewable tablet 200 mg of elemental calcium  1 tablet Oral BID Dustin Flock, MD   200 mg of elemental calcium at 11/06/15 0825  . cefTRIAXone (ROCEPHIN) 1 g in dextrose 5 % 50 mL IVPB  1 g Intravenous Q24H Dustin Flock, MD   1 g at 11/05/15 1745  . cholecalciferol (VITAMIN D) tablet 1,000 Units  1,000 Units Oral Daily Dustin Flock, MD   1,000 Units at 11/06/15 [phone]  . cyclobenzaprine (FLEXERIL) tablet 5 mg  5 mg Oral TID Dustin Flock, MD   5 mg at 11/06/15 [id number]  . docusate sodium (COLACE) capsule 100 mg  100 mg Oral BID Dustin Flock, MD   100 mg at 11/06/15 0824  . enoxaparin (LOVENOX) injection 40 mg  40 mg Subcutaneous Q24H Henreitta Leber, MD   40 mg at 11/05/15 1744  . HYDROcodone-acetaminophen (NORCO/VICODIN) 5-325 MG per tablet 1-2 tablet  1-2 tablet Oral Q4H PRN Dustin Flock, MD   1 tablet at 11/06/15 0122  . lisinopril (PRINIVIL,ZESTRIL) tablet 10 mg  10 mg Oral BID Dustin Flock, MD   10 mg at 11/06/15 [id number]  . [START ON 11/07/2015] methylPREDNISolone (MEDROL DOSEPAK) tablet 4 mg  4 mg Oral 4X daily taper Lenis Noon, RPH      . methylPREDNISolone (MEDROL DOSEPAK) tablet 8 mg  8 mg Oral Nightly Darylene Price [place], [facility]      . metoprolol succinate (TOPROL-XL) 24 hr tablet 50 mg  50 mg Oral Daily Dustin Flock, MD   50 mg at 11/06/15 0823  . mirtazapine (REMERON) tablet 30 mg  30 mg Oral QHS Dustin Flock, MD   30 mg at 11/05/15 2106  . morphine 2 MG/ML injection 1 mg  1 mg Intravenous Q4H PRN Dustin Flock, MD      . ondansetron (ZOFRAN) tablet 4 mg  4 mg Oral Q6H PRN Dustin Flock, MD       Or  . ondansetron (ZOFRAN) injection 4 mg  4 mg Intravenous Q6H PRN Dustin Flock, MD      . PARoxetine (PAXIL) tablet 5 mg  5 mg Oral Daily Dustin Flock, MD   5 mg at 11/06/15 0824  . phenazopyridine (PYRIDIUM) tablet 100 mg  100 mg Oral TID WC Henreitta Leber, MD   100 mg at 11/06/15 0825  . potassium chloride SA  (K-DUR,KLOR-CON) CR tablet 20 mEq  20 mEq Oral BID Henreitta Leber, MD   20 mEq at 11/06/15 0824  . senna (SENOKOT) tablet 8.6 mg  1 tablet Oral BID Dustin Flock, MD   8.6 mg at 11/06/15 [id number]     Discharge Medications: Please see discharge summary for a list of discharge medications.  Relevant Imaging Results:  Relevant Lab Results:   Additional Information  (SSN: [id number])  Sonu Kruckenberg, Veronia Beets, LCSW

## 2015-11-07 LAB — BASIC METABOLIC PANEL
Anion gap: 9 (ref 5–15)
BUN: 14 mg/dL (ref 6–20)
CALCIUM: 8 mg/dL — AB (ref 8.9–10.3)
CO2: 24 mmol/L (ref 22–32)
CREATININE: 0.4 mg/dL — AB (ref 0.44–1.00)
Chloride: 88 mmol/L — ABNORMAL LOW (ref 101–111)
GLUCOSE: 111 mg/dL — AB (ref 65–99)
Potassium: 3.1 mmol/L — ABNORMAL LOW (ref 3.5–5.1)
Sodium: 121 mmol/L — ABNORMAL LOW (ref 135–145)

## 2015-11-07 LAB — MAGNESIUM: MAGNESIUM: 1.6 mg/dL — AB (ref 1.7–2.4)

## 2015-11-07 LAB — OSMOLALITY, URINE: OSMOLALITY UR: 345 mosm/kg (ref 300–900)

## 2015-11-07 LAB — SODIUM, URINE, RANDOM: SODIUM UR: 74 mmol/L

## 2015-11-07 MED ORDER — BISACODYL 10 MG RE SUPP
10.0000 mg | Freq: Every day | RECTAL | Status: DC | PRN
  Administered 2015-11-08 – 2015-11-09 (×2): 10 mg via RECTAL
  Filled 2015-11-07 (×2): qty 1

## 2015-11-07 MED ORDER — POLYETHYLENE GLYCOL 3350 17 G PO PACK
17.0000 g | PACK | Freq: Every day | ORAL | Status: DC | PRN
  Administered 2015-11-07 – 2015-11-09 (×3): 17 g via ORAL
  Filled 2015-11-07 (×3): qty 1

## 2015-11-07 MED ORDER — SODIUM CHLORIDE 1 G PO TABS
1.0000 g | ORAL_TABLET | Freq: Three times a day (TID) | ORAL | Status: DC
  Administered 2015-11-07 – 2015-11-09 (×7): 1 g via ORAL
  Filled 2015-11-07 (×5): qty 1

## 2015-11-07 MED ORDER — MAGNESIUM SULFATE 2 GM/50ML IV SOLN
2.0000 g | Freq: Once | INTRAVENOUS | Status: AC
Start: 2015-11-07 — End: 2015-11-07
  Administered 2015-11-07: 2 g via INTRAVENOUS
  Filled 2015-11-07: qty 50

## 2015-11-07 NOTE — Consult Note (Signed)
   [facility] CM Inpatient Consult   11/07/2015  Lori Henson 1919/09/24 [id number]   Patient screened for potential [facility] Management services. Patient is eligible for [place]. Electronic medical record reveals patient's discharge plan is SNF. [facility] Care Management services not appropriate at this time. If patient's post hospital needs change please place a [facility] Care Management consult. For questions please contact:   Charl Wellen RN, [facility] Liaison  [phone]) Business Mobile [phone]) Toll free office

## 2015-11-07 NOTE — Progress Notes (Signed)
[place] [facility] [place] NAME: Lori Henson    MR#:  [id number]  DATE OF BIRTH:  1920/02/04  SUBJECTIVE:   Sodium remains low. Pt. Sitting up in chair.  Son at bedside.  No other complaints presently.    REVIEW OF SYSTEMS:    Review of Systems  Constitutional: Negative for chills and fever.  HENT: Negative for congestion and tinnitus.   Eyes: Negative for blurred vision and double vision.  Respiratory: Negative for cough, shortness of breath and wheezing.   Cardiovascular: Negative for chest pain, orthopnea and PND.  Gastrointestinal: Negative for abdominal pain, diarrhea, nausea and vomiting.  Genitourinary: Negative for dysuria and hematuria.  Neurological: Positive for weakness. Negative for dizziness, sensory change and focal weakness.  All other systems reviewed and are negative.   Nutrition: Regular Tolerating Diet: yes Tolerating PT: PT eval noted.   DRUG ALLERGIES:   Allergies  Allergen Reactions  . Macrobid [Nitrofurantoin Macrocrystal] Itching  . Nitrofurantoin Itching and Other (See Comments)  . Ranitidine Hcl Diarrhea  . Zantac [Ranitidine Hcl] Diarrhea    VITALS:  Blood pressure (!) 147/62, pulse (!) 59, temperature 98.3 F (36.8 C), temperature source Oral, resp. rate 16, height 5\' 6"  (1.676 m), weight 62 kg (136 lb 11.2 oz), SpO2 95 %.  PHYSICAL EXAMINATION:   Physical Exam  GENERAL:  82 y.o.-year-old patient sitting in chair in no acute distress.  EYES: Pupils equal, round, reactive to light and accommodation. No scleral icterus. Extraocular muscles intact.  HEENT: Head atraumatic, normocephalic. Oropharynx and nasopharynx clear.  NECK:  Supple, no jugular venous distention. No thyroid enlargement, no tenderness.  LUNGS: Normal breath sounds bilaterally, no wheezing, rales, rhonchi. No use of accessory muscles of respiration.  CARDIOVASCULAR: S1, S2 normal. No murmurs, rubs, or gallops.  ABDOMEN: Soft, nontender,  nondistended. Bowel sounds present. No organomegaly or mass.  EXTREMITIES: No cyanosis, clubbing or edema b/l.    NEUROLOGIC: Cranial nerves II through XII are intact. No focal Motor or sensory deficits b/l. Globally weak.    PSYCHIATRIC: The patient is alert and oriented x 3.  SKIN: No obvious rash, lesion, or ulcer.    LABORATORY PANEL:   CBC  Recent Labs Lab 11/05/15 0406  WBC 9.6  HGB 12.1  HCT 34.2*  PLT 206   ------------------------------------------------------------------------------------------------------------------  Chemistries   Recent Labs Lab 11/04/15 1420  11/07/15 0521  NA 124*  < > 121*  K 4.0  < > 3.1*  CL 91*  < > 88*  CO2 24  < > 24  GLUCOSE 115*  < > 111*  BUN 14  < > 14  CREATININE 0.53  < > 0.40*  CALCIUM 8.4*  < > 8.0*  MG  --   --  1.6*  AST 30  --   --   ALT 16  --   --   ALKPHOS 94  --   --   BILITOT 0.8  --   --   < > = values in this interval not displayed. ------------------------------------------------------------------------------------------------------------------  Cardiac Enzymes No results for input(s): TROPONINI in the last 168 hours. ------------------------------------------------------------------------------------------------------------------  RADIOLOGY:  No results found.   ASSESSMENT AND PLAN:   82 yo female w/ hx of HTN, GERD, a. Fib/flutter s/p pacemaker, hx of Melanoma, who presented to the hospital due to back pain, abdominal pain and noted to have urinary tract infection.  1. Urinary tract infection-continue IV ceftriaxone, And cultures growing 100,000 colonies of gram-negative rod.  Identification pending. -Currently afebrile and hemodynamically stable. -Continue Pyridium.  2. Hyponatremia-etiology unclear. Despite getting some fluids patient's sodium went down. Questionable SIADH.  - appreciate Nephro input and started on fluid restriction and salt tabs and will follow sodium.    3. Back pain-etiology  unclear. Likely musculoskeletal in nature. -Continue Flexeril, Medrol dosepak for now. -PT evaluation noted.  4. Essential hypertension-continue lisinopril, metoprolol  5. Anxiety-continue Paxil  Pt eval noted and pt. Will likely need SNF and possible d/c there on Friday.    All the records are reviewed and case discussed with Care Management/Social Workerr. Management plans discussed with the patient, family and they are in agreement.  CODE STATUS: Full Code  DVT Prophylaxis: Hep. SQ  TOTAL TIME TAKING CARE OF THIS PATIENT: 30 minutes.   POSSIBLE D/C IN 2-3 DAYS, DEPENDING ON CLINICAL CONDITION.   Henreitta Leber M.D on 11/07/2015 at 3:09 PM  Between 7am to 6pm - Pager - [phone]  After 6pm go to [email or web address] - password [email or web address] [facility]  Office  [phone]  CC: Primary care physician; Einar Pheasant, MD

## 2015-11-07 NOTE — Progress Notes (Signed)
Clinical Education officer, museum (CSW) presented bed offers to patient. She chose [facility] CSW left patient's daughter [name] a voicemail making her aware of above. CSW sent Kim admissions coordinator at [facility] a message making her aware of accepted bed offer. CSW will continue to follow and assist as needed.   [employer], LCSW [phone]

## 2015-11-07 NOTE — Progress Notes (Signed)
[facility]  ROUNDING NOTE   Subjective:   Sitting in chair.  Na 121   Objective:  Vital signs in last 24 hours:  Temp:  [97.8 F (36.6 C)-98.3 F (36.8 C)] 98.3 F (36.8 C) (08/02 0800) Pulse Rate:  [44-125] 59 (08/02 0800) Resp:  [16-20] 16 (08/02 0800) BP: (144-177)/(62-88) 147/62 (08/02 0800) SpO2:  [95 %-98 %] 95 % (08/02 0800)  Weight change:  Filed Weights   11/04/15 1400 11/04/15 2047  Weight: 62.1 kg (137 lb) 62 kg (136 lb 11.2 oz)    Intake/Output: I/O last 3 completed shifts: In: -  Out: 1950 [Urine:1950]   Intake/Output this shift:  Total I/O In: -  Out: 300 [Urine:300]  Physical Exam: General: NAD,   Head: Normocephalic, atraumatic. Moist oral mucosal membranes  Eyes: Anicteric, PERRL  Neck: Supple, trachea midline  Lungs:  Clear to auscultation  Heart: Regular rate and rhythm  Abdomen:  Soft, nontender,   Extremities: no peripheral edema.  Neurologic: Nonfocal, moving all four extremities  Skin: No lesions       Basic Metabolic Panel:  Recent Labs Lab 11/04/15 1420 11/05/15 0406 11/06/15 0917 11/06/15 1607 11/07/15 0521  NA 124* 124* 120* 121* 121*  K 4.0 2.9* 2.9*  --  3.1*  CL 91* 92* 90*  --  88*  CO2 24 23 24   --  24  GLUCOSE 115* 106* 124*  --  111*  BUN 14 11 11   --  14  CREATININE 0.53 0.49 0.48  --  0.40*  CALCIUM 8.4* 7.9* 7.6*  --  8.0*  MG  --   --   --   --  1.6*    Liver Function Tests:  Recent Labs Lab 11/04/15 1420  AST 30  ALT 16  ALKPHOS 94  BILITOT 0.8  PROT 7.1  ALBUMIN 3.5    Recent Labs Lab 11/04/15 1420  LIPASE 18   No results for input(s): AMMONIA in the last 168 hours.  CBC:  Recent Labs Lab 11/04/15 1420 11/05/15 0406  WBC 10.2 9.6  HGB 12.5 12.1  HCT 34.8* 34.2*  MCV 88.6 87.7  PLT 215 206    Cardiac Enzymes: No results for input(s): CKTOTAL, CKMB, CKMBINDEX, TROPONINI in the last 168 hours.  BNP: Invalid input(s): POCBNP  CBG: No results for input(s):  GLUCAP in the last 168 hours.  Microbiology: Results for orders placed or performed during the hospital encounter of 11/04/15  Urine culture     Status: Abnormal (Preliminary result)   Collection Time: 11/04/15  5:37 PM  Result Value Ref Range Status   Specimen Description URINE, RANDOM  Final   Special Requests NONE  Final   Culture >=100,000 COLONIES/mL GRAM NEGATIVE RODS (A)  Final   Report Status PENDING  Incomplete  MRSA PCR Screening     Status: None   Collection Time: 11/04/15  8:15 PM  Result Value Ref Range Status   MRSA by PCR NEGATIVE NEGATIVE Final    Comment:        The GeneXpert MRSA Assay (FDA approved for NASAL specimens only), is one component of a comprehensive MRSA colonization surveillance program. It is not intended to diagnose MRSA infection nor to guide or monitor treatment for MRSA infections.     Coagulation Studies: No results for input(s): LABPROT, INR in the last 72 hours.  Urinalysis:  Recent Labs  11/04/15 1737  COLORURINE YELLOW*  LABSPEC 1.015  PHURINE 6.0  GLUCOSEU NEGATIVE  HGBUR NEGATIVE  BILIRUBINUR NEGATIVE  KETONESUR 1+*  PROTEINUR 100*  NITRITE POSITIVE*  LEUKOCYTESUR 3+*      Imaging: No results found.   Medications:     . aspirin EC  81 mg Oral Daily  . calcium carbonate  1 tablet Oral BID  . cefTRIAXone (ROCEPHIN)  IV  1 g Intravenous Q24H  . cholecalciferol  1,000 Units Oral Daily  . cyclobenzaprine  5 mg Oral TID  . docusate sodium  100 mg Oral BID  . enoxaparin (LOVENOX) injection  40 mg Subcutaneous Q24H  . lisinopril  10 mg Oral BID  . methylPREDNISolone  4 mg Oral 4X daily taper  . metoprolol succinate  50 mg Oral Daily  . mirtazapine  30 mg Oral QHS  . PARoxetine  5 mg Oral Daily  . phenazopyridine  100 mg Oral TID WC  . potassium chloride  20 mEq Oral BID  . senna  1 tablet Oral BID  . sodium chloride  1 g Oral TID WC   acetaminophen **OR** acetaminophen, HYDROcodone-acetaminophen, morphine  injection, ondansetron **OR** ondansetron (ZOFRAN) IV  Assessment/ Plan:  [name] is a 82 y.o. white female  with hypertension, depression, anxiety, atrial flutter, pacemaker, GERD, lichen planus, who was admitted to [facility] on 11/04/2015   1. Hyponatremia: underlying SIADH? Lower with administration of 0.9 saline.  - fluid restriction - started on sodium chloride.  - serial sodiums  2. Hypokalemia: did not receive diuretics. Most likely from saline infusion - agree with PO potassium supplementation.   3. Urinary Tract Infection: gram negative rods.  - empiric ceftriaxone  4. Hypertension: elevated - metoprolol and lisinopril.    LOS: [place], Raymond 8/2/201712:02 PM

## 2015-11-08 LAB — URINE CULTURE: Culture: >=100000 — AB

## 2015-11-08 LAB — BASIC METABOLIC PANEL
Anion gap: 7 (ref 5–15)
BUN: 18 mg/dL (ref 6–20)
CO2: 26 mmol/L (ref 22–32)
Calcium: 8.1 mg/dL — ABNORMAL LOW (ref 8.9–10.3)
Chloride: 92 mmol/L — ABNORMAL LOW (ref 101–111)
Creatinine, Ser: 0.62 mg/dL (ref 0.44–1.00)
GFR calc Af Amer: >60 mL/min (ref >60)
GLUCOSE: 117 mg/dL — AB (ref 65–99)
Potassium: 3.2 mmol/L — ABNORMAL LOW (ref 3.5–5.1)
SODIUM: 125 mmol/L — AB (ref 135–145)

## 2015-11-08 MED ORDER — CEFUROXIME AXETIL 500 MG PO TABS
500.0000 mg | ORAL_TABLET | Freq: Two times a day (BID) | ORAL | Status: DC
  Administered 2015-11-08 – 2015-11-09 (×2): 500 mg via ORAL
  Filled 2015-11-08 (×2): qty 1

## 2015-11-08 NOTE — Progress Notes (Signed)
[place] [facility] [place] NAME: Hadasha Artley    MR#:  [id number]  DATE OF BIRTH:  Nov 16, 1919  SUBJECTIVE:   Sodium Improving.  Still complaining of vague lower back, abdominal pain.  No other acute events overnight.    REVIEW OF SYSTEMS:    Review of Systems  Constitutional: Negative for chills and fever.  HENT: Negative for congestion and tinnitus.   Eyes: Negative for blurred vision and double vision.  Respiratory: Negative for cough, shortness of breath and wheezing.   Cardiovascular: Negative for chest pain, orthopnea and PND.  Gastrointestinal: Negative for abdominal pain, diarrhea, nausea and vomiting.  Genitourinary: Negative for dysuria and hematuria.  Neurological: Positive for weakness. Negative for dizziness, sensory change, focal weakness and seizures.  All other systems reviewed and are negative.   Nutrition: Regular Tolerating Diet: yes Tolerating PT: PT eval noted.   DRUG ALLERGIES:   Allergies  Allergen Reactions  . Macrobid [Nitrofurantoin Macrocrystal] Itching  . Nitrofurantoin Itching and Other (See Comments)  . Ranitidine Hcl Diarrhea  . Zantac [Ranitidine Hcl] Diarrhea    VITALS:  Blood pressure 117/88, pulse (!) 53, temperature 97.8 F (36.6 C), temperature source Oral, resp. rate 16, height 5\' 6"  (1.676 m), weight 62 kg (136 lb 11.2 oz), SpO2 96 %.  PHYSICAL EXAMINATION:   Physical Exam  GENERAL:  82 y.o.-year-old patient lying in bed in no acute distress.  EYES: Pupils equal, round, reactive to light and accommodation. No scleral icterus. Extraocular muscles intact.  HEENT: Head atraumatic, normocephalic. Oropharynx and nasopharynx clear.  NECK:  Supple, no jugular venous distention. No thyroid enlargement, no tenderness.  LUNGS: Normal breath sounds bilaterally, no wheezing, rales, rhonchi. No use of accessory muscles of respiration.  CARDIOVASCULAR: S1, S2 normal. No murmurs, rubs, or gallops.  ABDOMEN:  Soft, nontender, nondistended. Bowel sounds present. No organomegaly or mass.  EXTREMITIES: No cyanosis, clubbing or edema b/l.    NEUROLOGIC: Cranial nerves II through XII are intact. No focal Motor or sensory deficits b/l. Globally weak.    PSYCHIATRIC: The patient is alert and oriented x 3.  SKIN: No obvious rash, lesion, or ulcer.    LABORATORY PANEL:   CBC  Recent Labs Lab 11/05/15 0406  WBC 9.6  HGB 12.1  HCT 34.2*  PLT 206   ------------------------------------------------------------------------------------------------------------------  Chemistries   Recent Labs Lab 11/04/15 1420  11/07/15 0521 11/08/15 0337  NA 124*  < > 121* 125*  K 4.0  < > 3.1* 3.2*  CL 91*  < > 88* 92*  CO2 24  < > 24 26  GLUCOSE 115*  < > 111* 117*  BUN 14  < > 14 18  CREATININE 0.53  < > 0.40* 0.62  CALCIUM 8.4*  < > 8.0* 8.1*  MG  --   --  1.6*  --   AST 30  --   --   --   ALT 16  --   --   --   ALKPHOS 94  --   --   --   BILITOT 0.8  --   --   --   < > = values in this interval not displayed. ------------------------------------------------------------------------------------------------------------------  Cardiac Enzymes No results for input(s): TROPONINI in the last 168 hours. ------------------------------------------------------------------------------------------------------------------  RADIOLOGY:  No results found.   ASSESSMENT AND PLAN:   82 yo female w/ hx of HTN, GERD, a. Fib/flutter s/p pacemaker, hx of Melanoma, who presented to the hospital due to back  pain, abdominal pain and noted to have urinary tract infection.  1. Urinary tract infection- urine cultures growing E. Coli which is pansensitive.  - will switch from IV Ceftriaxone to Oral Ceftin.   -Currently afebrile and hemodynamically stable. -Continue Pyridium.  2. Hyponatremia-etiology unclear. Despite getting some fluids patient's sodium went down. Likely SIADH.  - cont. Fluid restriction, sodium  chloride tabs and it's improving and will monitor.  - appreciate Nephro input.    3. Back pain-etiology unclear. Likely musculoskeletal in nature. -Continue Flexeril, Medrol dosepak for now. -PT evaluation noted and likely to rehab tomorrow.   4. Essential hypertension-continue lisinopril, metoprolol  5. Anxiety-continue Paxil  Pt. eval noted and pt. Will likely need SNF and possible d/c there on Friday.    All the records are reviewed and case discussed with Care Management/Social Workerr. Management plans discussed with the patient, family and they are in agreement.  CODE STATUS: Full Code  DVT Prophylaxis: Hep. SQ  TOTAL TIME TAKING CARE OF THIS PATIENT: 30 minutes.   POSSIBLE D/C IN 1-2 DAYS, DEPENDING ON CLINICAL CONDITION.   Henreitta Leber M.D on 11/08/2015 at 2:27 PM  Between 7am to 6pm - Pager - [phone]  After 6pm go to [email or web address] - password [email or web address] [facility]  Office  [phone]  CC: Primary care physician; Einar Pheasant, MD

## 2015-11-08 NOTE — Progress Notes (Signed)
Plan is for patient to D/C to [facility] tomorrow when medically stable. Patient is aware of above. Per patient her son Phillip Heal came to [place] from [place] to visit. Patient reported that son is aware of D/C to [facility] tomorrow. CSW attempted to call patient's son however the call would not go through. Kim admissions coordinator at [facility] is aware of above. CSW will continue to follow and assist as needed.   [employer], LCSW [phone]

## 2015-11-08 NOTE — Progress Notes (Signed)
[facility]  ROUNDING NOTE   Subjective:   Na 125  Objective:  Vital signs in last 24 hours:  Temp:  [97.8 F (36.6 C)-98.8 F (37.1 C)] 98.8 F (37.1 C) (08/03 0515) Pulse Rate:  [75-109] 82 (08/03 0939) Resp:  [18-19] 19 (08/03 0515) BP: (135-148)/(75-82) 148/82 (08/03 0939) SpO2:  [97 %-98 %] 98 % (08/03 0515)  Weight change:  Filed Weights   11/04/15 1400 11/04/15 2047  Weight: 62.1 kg (137 lb) 62 kg (136 lb 11.2 oz)    Intake/Output: I/O last 3 completed shifts: In: 240 [P.O.:240] Out: 2400 [Urine:2400]   Intake/Output this shift:  Total I/O In: 240 [P.O.:240] Out: -   Physical Exam: General: NAD,   Head: Normocephalic, atraumatic. Moist oral mucosal membranes  Eyes: Anicteric, PERRL  Neck: Supple, trachea midline  Lungs:  Clear to auscultation  Heart: Regular rate and rhythm  Abdomen:  Soft, nontender,   Extremities: no peripheral edema.  Neurologic: Nonfocal, moving all four extremities  Skin: No lesions       Basic Metabolic Panel:  Recent Labs Lab 11/04/15 1420 11/05/15 0406 11/06/15 0917 11/06/15 1607 11/07/15 0521 11/08/15 0337  NA 124* 124* 120* 121* 121* 125*  K 4.0 2.9* 2.9*  --  3.1* 3.2*  CL 91* 92* 90*  --  88* 92*  CO2 24 23 24   --  24 26  GLUCOSE 115* 106* 124*  --  111* 117*  BUN 14 11 11   --  14 18  CREATININE 0.53 0.49 0.48  --  0.40* 0.62  CALCIUM 8.4* 7.9* 7.6*  --  8.0* 8.1*  MG  --   --   --   --  1.6*  --     Liver Function Tests:  Recent Labs Lab 11/04/15 1420  AST 30  ALT 16  ALKPHOS 94  BILITOT 0.8  PROT 7.1  ALBUMIN 3.5    Recent Labs Lab 11/04/15 1420  LIPASE 18   No results for input(s): AMMONIA in the last 168 hours.  CBC:  Recent Labs Lab 11/04/15 1420 11/05/15 0406  WBC 10.2 9.6  HGB 12.5 12.1  HCT 34.8* 34.2*  MCV 88.6 87.7  PLT 215 206    Cardiac Enzymes: No results for input(s): CKTOTAL, CKMB, CKMBINDEX, TROPONINI in the last 168 hours.  BNP: Invalid input(s):  POCBNP  CBG: No results for input(s): GLUCAP in the last 168 hours.  Microbiology: Results for orders placed or performed during the hospital encounter of 11/04/15  Urine culture     Status: Abnormal   Collection Time: 11/04/15  5:37 PM  Result Value Ref Range Status   Specimen Description URINE, RANDOM  Final   Special Requests NONE  Final   Culture >=100,000 COLONIES/mL ESCHERICHIA COLI (A)  Final   Report Status 11/08/2015 FINAL  Final   Organism ID, Bacteria ESCHERICHIA COLI (A)  Final      Susceptibility   Escherichia coli - MIC*    AMPICILLIN <=2 SENSITIVE Sensitive     CEFAZOLIN <=4 SENSITIVE Sensitive     CEFTRIAXONE <=1 SENSITIVE Sensitive     CIPROFLOXACIN <=0.25 SENSITIVE Sensitive     GENTAMICIN 4 SENSITIVE Sensitive     IMIPENEM <=0.25 SENSITIVE Sensitive     NITROFURANTOIN <=16 SENSITIVE Sensitive     TRIMETH/SULFA <=20 SENSITIVE Sensitive     AMPICILLIN/SULBACTAM <=2 SENSITIVE Sensitive     Extended ESBL NEGATIVE Sensitive     * >=100,000 COLONIES/mL ESCHERICHIA COLI  MRSA PCR Screening  Status: None   Collection Time: 11/04/15  8:15 PM  Result Value Ref Range Status   MRSA by PCR NEGATIVE NEGATIVE Final    Comment:        The GeneXpert MRSA Assay (FDA approved for NASAL specimens only), is one component of a comprehensive MRSA colonization surveillance program. It is not intended to diagnose MRSA infection nor to guide or monitor treatment for MRSA infections.     Coagulation Studies: No results for input(s): LABPROT, INR in the last 72 hours.  Urinalysis: No results for input(s): COLORURINE, LABSPEC, PHURINE, GLUCOSEU, HGBUR, BILIRUBINUR, KETONESUR, PROTEINUR, UROBILINOGEN, NITRITE, LEUKOCYTESUR in the last 72 hours.  Invalid input(s): APPERANCEUR    Imaging: No results found.   Medications:     . aspirin EC  81 mg Oral Daily  . calcium carbonate  1 tablet Oral BID  . cefTRIAXone (ROCEPHIN)  IV  1 g Intravenous Q24H  .  cholecalciferol  1,000 Units Oral Daily  . cyclobenzaprine  5 mg Oral TID  . docusate sodium  100 mg Oral BID  . enoxaparin (LOVENOX) injection  40 mg Subcutaneous Q24H  . lisinopril  10 mg Oral BID  . methylPREDNISolone  4 mg Oral 4X daily taper  . metoprolol succinate  50 mg Oral Daily  . mirtazapine  30 mg Oral QHS  . PARoxetine  5 mg Oral Daily  . phenazopyridine  100 mg Oral TID WC  . potassium chloride  20 mEq Oral BID  . senna  1 tablet Oral BID  . sodium chloride  1 g Oral TID WC   acetaminophen **OR** acetaminophen, bisacodyl, HYDROcodone-acetaminophen, morphine injection, ondansetron **OR** ondansetron (ZOFRAN) IV, polyethylene glycol  Assessment/ Plan:  [name] is a 82 y.o. white female  with hypertension, depression, anxiety, atrial flutter, pacemaker, GERD, lichen planus, who was admitted to [facility] on 11/04/2015   1. Hyponatremia: underlying SIADH? Lower with administration of 0.9 saline.  - fluid restriction - PO sodium chloride.  - serial sodiums  2. Hypokalemia: did not receive diuretics. Most likely from saline infusion - agree with PO potassium supplementation.   3. Urinary Tract Infection: E. Coli - empiric ceftriaxone  4. Hypertension: better control - metoprolol and lisinopril.    LOS: [place], Lori Henson 8/3/201710:54 AM

## 2015-11-08 NOTE — Progress Notes (Signed)
Physical Therapy Treatment Patient Details Name: Lori Henson MRN: [id number] DOB: 12-16-1919 Today's Date: 11/08/2015    History of Present Illness Pt is a 82 y/o female admitted for back pain. Per pt notes, pt reports that she felt as though she may have pulled something in back when trying to put shirt on. Since that incident she has felt spasms in her R lower back when moving into vertain positions. PMh includes HTN, osteaoarthritias, and a history of back surgery.     PT Comments    Pt demonstrates slight increase in ambulation distance on this date compared to prior sessions. She ambulates exceedingly slowly requiring 23 seconds to ambulate 10.' Five time sit to stand of 31.30 indicates decreased LE power and increased fall risk. Pt requires UE assist for test as she cannot perform sit to stand without UE support. Pt will need SNF placement at discharge in order to facilitate safe return to prior level of function at home. Pt will benefit from skilled PT services to address deficits in strength, balance, and mobility in order to return to full function at home.   Follow Up Recommendations  SNF     Equipment Recommendations  3in1 (PT)    Recommendations for Other Services       Precautions / Restrictions Precautions Precautions: Fall Restrictions Weight Bearing Restrictions: No    Mobility  Bed Mobility Overal bed mobility: Needs Assistance Bed Mobility: Supine to Sit     Supine to sit: Supervision Sit to supine: Supervision   General bed mobility comments: Pt moves very slow requiring extended time to come to sitting due to back pain. HOB elevated and bed rail utilized but no manual assist from therapist required  Transfers Overall transfer level: Needs assistance Equipment used: Rolling walker (2 wheeled) Transfers: Sit to/from Stand Sit to Stand: Min assist         General transfer comment: Pt requires minA for stability with sit to stand transfer. Pt takes  increased time to come to standing demonstrating decreased LE strength. 5TSTS: 31.30 with use of UE to assist. Pt unable to perform transfer without UE support  Ambulation/Gait Ambulation/Gait assistance: Min guard Ambulation Distance (Feet): 90 Feet Assistive device: Rolling walker (2 wheeled) Gait Pattern/deviations: Step-through pattern (partial step through pattern) Gait velocity: 10'=23.0 seconds Gait velocity interpretation: <1.8 ft/sec, indicative of risk for recurrent falls General Gait Details: Pt ambulates very slowly out to RN station and back to room. She requires min to mod verbal cues for turns and safety with rolling walker. Pt provided cues for upright posture and safety. Denies DOE. Pt ambulates 10' in 23.0 seconds   Stairs            Wheelchair Mobility    Modified Rankin (Stroke Patients Only)       Balance Overall balance assessment: Needs assistance Sitting-balance support: No upper extremity supported Sitting balance-Leahy Scale: Normal     Standing balance support: Bilateral upper extremity supported Standing balance-Leahy Scale: Fair Standing balance comment: Pt demonstrates some instability and requires UE support to maintain balance                    Cognition Arousal/Alertness: Awake/alert Behavior During Therapy: WFL for tasks assessed/performed Overall Cognitive Status: Within Functional Limits for tasks assessed                      Exercises General Exercises - Lower Extremity Long Arc Quad: Strengthening;Both;10 reps;Seated Heel Slides: Strengthening;Both;10 reps;Seated  Hip ABduction/ADduction: Strengthening;Both;10 reps;Seated Hip Flexion/Marching: Strengthening;Both;10 reps;Seated Heel Raises: Strengthening;Both;10 reps;Seated    General Comments        Pertinent Vitals/Pain Pain Assessment: 0-10 Pain Score: 10-Worst pain ever Pain Location: R low back with movement, 0/10 at rest. 6/10 R abdominal pain Pain  Descriptors / Indicators: Spasm Pain Intervention(s): Monitored during session    Home Living                      Prior Function            PT Goals (current goals can now be found in the care plan section) Acute Rehab PT Goals Patient Stated Goal: To be discharged somewhere she would have more assistance PT Goal Formulation: With patient Time For Goal Achievement: 11/19/15 Potential to Achieve Goals: Good Progress towards PT goals: Progressing toward goals    Frequency  Min 2X/week    PT Plan Discharge plan needs to be updated    Co-evaluation             End of Session Equipment Utilized During Treatment: Gait belt Activity Tolerance: Patient tolerated treatment well Patient left: in bed;with call bell/phone within reach;with bed alarm set;Other (comment) (Refuses up to recliner)     Time: [id number] PT Time Calculation (min) (ACUTE ONLY): 26 min  Charges:  $Gait Training: 8-22 mins $Therapeutic Exercise: 8-22 mins                    G Codes:      Lyndel Safe Waleed Dettman PT, DPT   Mac Dowdell 11/08/2015, 1:39 PM

## 2015-11-09 ENCOUNTER — Inpatient Hospital Stay: Payer: Medicare Other

## 2015-11-09 ENCOUNTER — Encounter
Admission: RE | Admit: 2015-11-09 | Discharge: 2015-11-09 | Disposition: A | Discharge status: Home / Self Care | Payer: Medicare Other | Source: Ambulatory Visit | Attending: Internal Medicine | Admitting: Internal Medicine

## 2015-11-09 ENCOUNTER — Telehealth: Payer: Self-pay | Admitting: Internal Medicine

## 2015-11-09 LAB — BASIC METABOLIC PANEL
Anion gap: 7 (ref 5–15)
BUN: 20 mg/dL (ref 6–20)
CALCIUM: 8.1 mg/dL — AB (ref 8.9–10.3)
CHLORIDE: 96 mmol/L — AB (ref 101–111)
CO2: 25 mmol/L (ref 22–32)
CREATININE: 0.64 mg/dL (ref 0.44–1.00)
GFR calc non Af Amer: >60 mL/min (ref >60)
GLUCOSE: 180 mg/dL — AB (ref 65–99)
Potassium: 3.8 mmol/L (ref 3.5–5.1)
Sodium: 128 mmol/L — ABNORMAL LOW (ref 135–145)

## 2015-11-09 MED ORDER — CEFUROXIME AXETIL 500 MG PO TABS: 500.0000 mg | ORAL_TABLET | Freq: Two times a day (BID) | ORAL | 0 refills | Status: DC

## 2015-11-09 MED ORDER — POLYETHYLENE GLYCOL 3350 17 G PO PACK: 17.0000 g | PACK | Freq: Every day | ORAL | 0 refills | Status: DC | PRN

## 2015-11-09 MED ORDER — MAGNESIUM CITRATE PO SOLN
1.0000 | ORAL | Status: AC
  Administered 2015-11-09: 1 via ORAL
  Filled 2015-11-09: qty 296

## 2015-11-09 MED ORDER — METOPROLOL TARTRATE 50 MG PO TABS
50.0000 mg | ORAL_TABLET | Freq: Two times a day (BID) | ORAL | Status: DC
  Administered 2015-11-09: 50 mg via ORAL
  Filled 2015-11-09: qty 1

## 2015-11-09 MED ORDER — FLEET ENEMA 7-19 GM/118ML RE ENEM
1.0000 | ENEMA | Freq: Once | RECTAL | Status: AC
  Administered 2015-11-09: 1 via RECTAL

## 2015-11-09 MED ORDER — METHYLPREDNISOLONE 4 MG PO TBPK: ORAL_TABLET | ORAL | 0 refills | Status: DC

## 2015-11-09 MED ORDER — BISACODYL 10 MG RE SUPP
10.0000 mg | Freq: Every day | RECTAL | Status: DC
  Administered 2015-11-09: 10 mg via RECTAL
  Filled 2015-11-09: qty 1

## 2015-11-09 MED ORDER — SENNA 8.6 MG PO TABS: 1.0000 | ORAL_TABLET | Freq: Two times a day (BID) | ORAL | 0 refills | Status: DC

## 2015-11-09 MED ORDER — PAROXETINE HCL 10 MG PO TABS: 5.0000 mg | ORAL_TABLET | Freq: Every day | ORAL | 0 refills | Status: DC

## 2015-11-09 MED ORDER — LACTULOSE 10 GM/15ML PO SOLN: 30.0000 g | Freq: Two times a day (BID) | ORAL | 0 refills | Status: AC

## 2015-11-09 MED ORDER — LACTULOSE 10 GM/15ML PO SOLN
30.0000 g | Freq: Two times a day (BID) | ORAL | Status: DC
  Administered 2015-11-09: 30 g via ORAL
  Filled 2015-11-09: qty 60

## 2015-11-09 MED ORDER — METOPROLOL TARTRATE 50 MG PO TABS
50.0000 mg | ORAL_TABLET | Freq: Two times a day (BID) | ORAL | 0 refills | Status: AC
Start: 2015-11-09 — End: ?

## 2015-11-09 NOTE — Clinical Social Work Placement (Signed)
   CLINICAL SOCIAL WORK PLACEMENT  NOTE  Date:  11/09/2015  Patient Details  Name: Lori Henson MRN: [id number] Date of Birth: [birth year]  Clinical Social Work is seeking post-discharge placement for this patient at the [place] level of care (*CSW will initial, date and re-position this form in  chart as items are completed):  Yes   Patient/family provided with [facility] Work Department's list of facilities offering this level of care within the geographic area requested by the patient (or if unable, by the patient's family).  Yes   Patient/family informed of their freedom to choose among providers that offer the needed level of care, that participate in Medicare, Medicaid or managed care program needed by the patient, have an available bed and are willing to accept the patient.  Yes   Patient/family informed of [facility]'s ownership interest in [facility] and [facility], as well as of the fact that they are under no obligation to receive care at these facilities.  PASRR submitted to EDS on 11/06/15     PASRR number received on 11/06/15     Existing PASRR number confirmed on       FL2 transmitted to all facilities in geographic area requested by pt/family on 11/06/15     FL2 transmitted to all facilities within larger geographic area on       Patient informed that his/her managed care company has contracts with or will negotiate with certain facilities, including the following:        Yes   Patient/family informed of bed offers received.  Patient chooses bed at  [facility] )     Physician recommends and patient chooses bed at      [facility] on [facility]09[facility]15.  Patient to be transferred to facility by  [facility] )     Patient family notified on 11/09/15 of transfer.  Name of family member notified:   (CSW left patient's daughter [name] a voicemail making her aware of above. )      PHYSICIAN       Additional Comment:    _______________________________________________ Kyanna Mahrt, Veronia Beets, LCSW 11/09/2015, 10:47 AM

## 2015-11-09 NOTE — Telephone Encounter (Signed)
Noted. Will follow.  

## 2015-11-09 NOTE — Discharge Summary (Addendum)
[place] at [place] NAME: Nathaniel Boodoo    MR#:  [id number]  DATE OF BIRTH:  1919/08/03  DATE OF ADMISSION:  11/04/2015 ADMITTING PHYSICIAN: Dustin Flock, MD  DATE OF DISCHARGE: 11/09/2015  PRIMARY CARE PHYSICIAN: Einar Pheasant, MD    ADMISSION DIAGNOSIS:  Hyponatremia [E87.1] UTI (lower urinary tract infection) [N39.0] Lumbar strain, initial encounter [id number]  DISCHARGE DIAGNOSIS:  Active Problems:   Hyponatremia   Back pain   SECONDARY DIAGNOSIS:   Past Medical History:  Diagnosis Date  . Angular cheilitis   . Anxiety   . Atrial flutter ([place])   . Bradycardia 2007   s/p pacemaker  . Dysrhythmia   . GERD (gastroesophageal reflux disease)   . Hypertension   . Lichen planus   . Malignant melanoma ([place]) 2001   right leg, s/p radiation, recurrence 2008  . Osteoporosis    fosamax, compression fx s/p kyphoplasty  . Presence of permanent cardiac pacemaker     HOSPITAL COURSE:   82 yo female w/ hx of HTN, GERD, a. Fib/flutter s/p pacemaker, hx of Melanoma, who presented to the hospital due to back pain, abdominal pain and noted to have urinary tract infection.  1. Urinary tract infection- urine cultures growing E. Coli which is pansensitive.  She was  switchws from IV Ceftriaxone to Oral Ceftin.   She is currently afebrile and hemodynamically stable.  2. Hyponatremia: This is from SIADH.  Despite getting IV fluids patient's sodium went down. Sodium improved. She needs 1200 cc fluid restriction. She will follow up with NEPHROLOGY early next week for repeat BMP.   3. Back pain: Likely due to compression fractures. She is working with PT and can be discharged with back brace and outpatient follow up with ORTHO if this is not working. Continue Medrol dosepak. PT evaluation recommending SNF at discharge.  4. Essential hypertension: BP and HR elevated this am. I made some subtle changes to metoprolol for twice a day  administration versus once a day.  Continue lisinopril.  5. Anxiety: Continue Paxil  6. Constipation: Cont stool softners.   DISCHARGE CONDITIONS AND DIET:   Stable 1200 cc fluid restriction Regular diet  CONSULTS OBTAINED:  Treatment Team:  Lavonia Dana, MD  DRUG ALLERGIES:   Allergies  Allergen Reactions  . Macrobid [Nitrofurantoin Macrocrystal] Itching  . Nitrofurantoin Itching and Other (See Comments)  . Ranitidine Hcl Diarrhea  . Zantac [Ranitidine Hcl] Diarrhea    DISCHARGE MEDICATIONS:   Current Discharge Medication List    START taking these medications   Details  cefUROXime (CEFTIN) 500 MG tablet Take 1 tablet (500 mg total) by mouth 2 (two) times daily with a meal. Qty: 10 tablet, Refills: 0    lactulose (CHRONULAC) 10 GM/15ML solution Take 45 mLs (30 g total) by mouth 2 (two) times daily. Qty: 240 mL, Refills: 0    methylPREDNISolone (MEDROL DOSEPAK) 4 MG TBPK tablet 4 mg dose pack taper for taper Qty: 21 tablet, Refills: 0    metoprolol (LOPRESSOR) 50 MG tablet Take 1 tablet (50 mg total) by mouth 2 (two) times daily. Qty: 60 tablet, Refills: 0    polyethylene glycol (MIRALAX / GLYCOLAX) packet Take 17 g by mouth daily as needed for mild constipation. Qty: 14 each, Refills: 0    senna (SENOKOT) 8.6 MG TABS tablet Take 1 tablet (8.6 mg total) by mouth 2 (two) times daily. Qty: 120 each, Refills: 0      CONTINUE these medications which have  CHANGED   Details  PARoxetine (PAXIL) 10 MG tablet Take 0.5 tablets (5 mg total) by mouth daily. Qty: 30 tablet, Refills: 0      CONTINUE these medications which have NOT CHANGED   Details  aspirin EC 81 MG tablet Take 81 mg by mouth daily.    Casanthranol-Docusate Sodium 30-100 MG CAPS Take 1 capsule by mouth as needed.     Cholecalciferol ([employer] VITAMIN D3) 1000 UNITS capsule Take 1,000 Units by mouth daily.    lisinopril (PRINIVIL,ZESTRIL) 10 MG tablet take 1 tablet by mouth twice a day Qty: 60  tablet, Refills: 5    mirtazapine (REMERON) 30 MG tablet Take 1 tablet (30 mg total) by mouth at bedtime. Qty: 90 tablet, Refills: 1    Multiple Vitamins-Minerals (ICAPS) CAPS Take by mouth 2 (two) times daily.     sorbitol 70 % SOLN use if needed Qty: 1000 mL, Refills: 3    calcium carbonate (TUMS EX) 750 MG chewable tablet Chew 1 tablet by mouth 2 (two) times daily.      STOP taking these medications     metoprolol succinate (TOPROL-XL) 50 MG 24 hr tablet               Today   CHIEF COMPLAINT:  Doing ok constipated this am    VITAL SIGNS:  Blood pressure (!) 157/79, pulse 60, temperature 98.1 F (36.7 C), temperature source Oral, resp. rate 16, height 5\' 6"  (1.676 m), weight 62 kg (136 lb 11.2 oz), SpO2 98 %.   REVIEW OF SYSTEMS:  Review of Systems  Constitutional: Negative for chills, fever and malaise/fatigue.  HENT: Negative.  Negative for ear discharge, ear pain, hearing loss, nosebleeds and sore throat.   Eyes: Negative.  Negative for blurred vision and pain.  Respiratory: Negative.  Negative for cough, hemoptysis, shortness of breath and wheezing.   Cardiovascular: Negative.  Negative for chest pain, palpitations and leg swelling.  Gastrointestinal: Positive for constipation. Negative for abdominal pain, blood in stool, diarrhea, nausea and vomiting.  Genitourinary: Negative.  Negative for dysuria.  Musculoskeletal: Negative.  Negative for back pain.  Skin: Negative.   Neurological: Positive for weakness. Negative for dizziness, tremors, speech change, focal weakness, seizures and headaches.  Endo/Heme/Allergies: Negative.  Does not bruise/bleed easily.  Psychiatric/Behavioral: Negative.  Negative for depression, hallucinations and suicidal ideas.     PHYSICAL EXAMINATION:  GENERAL:  82 y.o.-year-old patient lying in the bed with no acute distress.  NECK:  Supple, no jugular venous distention. No thyroid enlargement, no tenderness.  LUNGS: Normal  breath sounds bilaterally, no wheezing, rales,rhonchi  No use of accessory muscles of respiration.  CARDIOVASCULAR: S1, S2 normal. No murmurs, rubs, or gallops.  ABDOMEN: Soft, non-tender, non-distended. Bowel sounds present. No organomegaly or mass.  EXTREMITIES: No pedal edema, cyanosis, or clubbing.  PSYCHIATRIC: The patient is alert and oriented x 3.  SKIN: No obvious rash, lesion, or ulcer.   DATA REVIEW:   CBC  Recent Labs Lab 11/05/15 0406  WBC 9.6  HGB 12.1  HCT 34.2*  PLT 206    Chemistries   Recent Labs Lab 11/04/15 1420  11/07/15 0521  11/09/15 0622  NA 124*  < > 121*  < > 128*  K 4.0  < > 3.1*  < > 3.8  CL 91*  < > 88*  < > 96*  CO2 24  < > 24  < > 25  GLUCOSE 115*  < > 111*  < > 180*  BUN  14  < > 14  < > 20  CREATININE 0.53  < > 0.40*  < > 0.64  CALCIUM 8.4*  < > 8.0*  < > 8.1*  MG  --   --  1.6*  --   --   AST 30  --   --   --   --   ALT 16  --   --   --   --   ALKPHOS 94  --   --   --   --   BILITOT 0.8  --   --   --   --   < > = values in this interval not displayed.  Cardiac Enzymes No results for input(s): TROPONINI in the last 168 hours.  Microbiology Results  @MICRORSLT48 @  RADIOLOGY:  Dg Abd 1 View  Result Date: 11/09/2015 CLINICAL DATA:  Constipation and abdominal pain EXAM: ABDOMEN - 1 VIEW COMPARISON:  CT abdomen and pelvis November 04, 2015 FINDINGS: There is moderate stool throughout colon. There is no bowel dilatation or air-fluid level suggesting obstruction. No free air. There is lumbar levoscoliosis. There are multiple wedge compression fractures in the lumbar spine. Patient has had kyphoplasty procedure at L5. Lung bases are clear. IMPRESSION: Moderate stool throughout colon. No bowel obstruction or free air. Multiple compression fractures in the lumbar spine with lumbar dextroscoliosis. Lung bases clear. Electronically Signed   By: Lowella Grip III M.D.   On: 11/09/2015 13:50      Management plans discussed with the patient and  she is in agreement. Stable for discharge SNF  Patient should follow up with PCP and Dr Abigail Butts  CODE STATUS:     Code Status Orders        Start     Ordered   11/04/15 1855  Full code  Continuous     11/04/15 1855    Code Status History    Date Active Date Inactive Code Status Order ID Comments User Context   03/07/2015  1:19 PM 03/07/2015  5:55 PM Full Code [id number]  Isaias Cowman, MD Inpatient    Advance Directive Documentation   Flowsheet Row Most Recent Value  Type of Advance Directive  Living will, Healthcare Power of Attorney  Pre-existing out of facility DNR order (yellow form or pink MOST form)  No data  "MOST" Form in Place?  No data      TOTAL TIME TAKING CARE OF THIS PATIENT: 36 minutes.    Note: This dictation was prepared with Dragon dictation along with smaller phrase technology. Any transcriptional errors that result from this process are unintentional.  Cherissa Hook M.D on 11/09/2015 at 2:08 PM  Between 7am to 6pm - Pager - [phone] After 6pm go to [email or web address] - password [email or web address] [facility]  Office  [phone]  CC: Primary care physician; Einar Pheasant, MD

## 2015-11-09 NOTE — Progress Notes (Signed)
Abdominal xray shows  Constipation. Dr mody notified. Pt to receive 1 bottle of magnesium citrate

## 2015-11-09 NOTE — Telephone Encounter (Signed)
HFU, Pt is being discharged from the hospital. Pt is going home. Dx was Hyponatremia. No appt avail to sch. Let me know where to sch appt. Thank you!

## 2015-11-09 NOTE — Care Management Note (Signed)
Case Management Note  Patient Details  Name: TOSHI WION MRN: [id number] Date of Birth: 1920/02/16  Subjective/Objective:                    Action/Plan: Patient will discharge to SNF today.  Notified [place] agency of change in plan. CSW following. Signed off.   Expected Discharge Date:                  Expected Discharge Plan:  [place]  In-House Referral:  Clinical Social Work  Discharge planning Services  CM Consult  Post Acute Care Choice:    Choice offered to:     DME Arranged:  N/A, Other see comment (Has DME [facility] and [facility] at home.) DME Agency:  NA  HH Arranged:    [place] Agency:  NA  Status of Service:  Completed, signed off  If discussed at [place] of Stay Meetings, dates discussed:    Additional Comments:  Alvie Heidelberg, RN 11/09/2015, 10:59 AM

## 2015-11-09 NOTE — Progress Notes (Addendum)
Patient is medically stable for D/C to [facility] today. Per Kim admissions coordinator at [facility] patient will go to room 209-A. RN will call report at [phone] and arrange EMS for transport. Clinical Education officer, museum (CSW) sent D/C orders to [employer] via [employer]. Patient is aware of above. CSW attempted to contact patient's daughter [name] however she did not answer and a voicemail was left. CSW also attempted to contact patient's son Phillip Heal however the call did not go through. Please reconsult if future social work needs arise. CSW signing off.   Patient's daughter [name] called CSW back and is in agreement with the above plan.   [employer], LCSW [phone]

## 2015-11-09 NOTE — Progress Notes (Signed)
Report called to Benjie Karvonen, Therapist, sports at [facility].

## 2015-11-09 NOTE — Progress Notes (Signed)
[facility]  ROUNDING NOTE   Subjective:   Na 128 (125) Complains of constipation  Objective:  Vital signs in last 24 hours:  Temp:  [97.7 F (36.5 C)-98.1 F (36.7 C)] 98.1 F (36.7 C) (08/04 0841) Pulse Rate:  [60-139] 60 (08/04 1139) Resp:  [16-18] 16 (08/04 0329) BP: (124-166)/(63-102) 157/79 (08/04 1139) SpO2:  [97 %-98 %] 98 % (08/04 0841)  Weight change:  Filed Weights   11/04/15 1400 11/04/15 2047  Weight: 62.1 kg (137 lb) 62 kg (136 lb 11.2 oz)    Intake/Output: I/O last 3 completed shifts: In: 1440 [P.O.:1440] Out: 2300 [Urine:2300]   Intake/Output this shift:  Total I/O In: 450 [P.O.:450] Out: -   Physical Exam: General: NAD,   Head: Normocephalic, atraumatic. Moist oral mucosal membranes  Eyes: Anicteric, PERRL  Neck: Supple, trachea midline  Lungs:  Clear to auscultation  Heart: Regular rate and rhythm  Abdomen:  Soft, nontender,   Extremities: no peripheral edema.  Neurologic: Nonfocal, moving all four extremities  Skin: No lesions       Basic Metabolic Panel:  Recent Labs Lab 11/05/15 0406 11/06/15 0917 11/06/15 1607 11/07/15 0521 11/08/15 0337 11/09/15 0622  NA 124* 120* 121* 121* 125* 128*  K 2.9* 2.9*  --  3.1* 3.2* 3.8  CL 92* 90*  --  88* 92* 96*  CO2 23 24  --  24 26 25   GLUCOSE 106* 124*  --  111* 117* 180*  BUN 11 11  --  14 18 20   CREATININE 0.49 0.48  --  0.40* 0.62 0.64  CALCIUM 7.9* 7.6*  --  8.0* 8.1* 8.1*  MG  --   --   --  1.6*  --   --     Liver Function Tests:  Recent Labs Lab 11/04/15 1420  AST 30  ALT 16  ALKPHOS 94  BILITOT 0.8  PROT 7.1  ALBUMIN 3.5    Recent Labs Lab 11/04/15 1420  LIPASE 18   No results for input(s): AMMONIA in the last 168 hours.  CBC:  Recent Labs Lab 11/04/15 1420 11/05/15 0406  WBC 10.2 9.6  HGB 12.5 12.1  HCT 34.8* 34.2*  MCV 88.6 87.7  PLT 215 206    Cardiac Enzymes: No results for input(s): CKTOTAL, CKMB, CKMBINDEX, TROPONINI in the last  168 hours.  BNP: Invalid input(s): POCBNP  CBG: No results for input(s): GLUCAP in the last 168 hours.  Microbiology: Results for orders placed or performed during the hospital encounter of 11/04/15  Urine culture     Status: Abnormal   Collection Time: 11/04/15  5:37 PM  Result Value Ref Range Status   Specimen Description URINE, RANDOM  Final   Special Requests NONE  Final   Culture >=100,000 COLONIES/mL ESCHERICHIA COLI (A)  Final   Report Status 11/08/2015 FINAL  Final   Organism ID, Bacteria ESCHERICHIA COLI (A)  Final      Susceptibility   Escherichia coli - MIC*    AMPICILLIN <=2 SENSITIVE Sensitive     CEFAZOLIN <=4 SENSITIVE Sensitive     CEFTRIAXONE <=1 SENSITIVE Sensitive     CIPROFLOXACIN <=0.25 SENSITIVE Sensitive     GENTAMICIN 4 SENSITIVE Sensitive     IMIPENEM <=0.25 SENSITIVE Sensitive     NITROFURANTOIN <=16 SENSITIVE Sensitive     TRIMETH/SULFA <=20 SENSITIVE Sensitive     AMPICILLIN/SULBACTAM <=2 SENSITIVE Sensitive     Extended ESBL NEGATIVE Sensitive     * >=100,000 COLONIES/mL ESCHERICHIA COLI  MRSA PCR Screening     Status: None   Collection Time: 11/04/15  8:15 PM  Result Value Ref Range Status   MRSA by PCR NEGATIVE NEGATIVE Final    Comment:        The GeneXpert MRSA Assay (FDA approved for NASAL specimens only), is one component of a comprehensive MRSA colonization surveillance program. It is not intended to diagnose MRSA infection nor to guide or monitor treatment for MRSA infections.     Coagulation Studies: No results for input(s): LABPROT, INR in the last 72 hours.  Urinalysis: No results for input(s): COLORURINE, LABSPEC, PHURINE, GLUCOSEU, HGBUR, BILIRUBINUR, KETONESUR, PROTEINUR, UROBILINOGEN, NITRITE, LEUKOCYTESUR in the last 72 hours.  Invalid input(s): APPERANCEUR    Imaging: Dg Abd 1 View  Result Date: 11/09/2015 CLINICAL DATA:  Constipation and abdominal pain EXAM: ABDOMEN - 1 VIEW COMPARISON:  CT abdomen and pelvis  November 04, 2015 FINDINGS: There is moderate stool throughout colon. There is no bowel dilatation or air-fluid level suggesting obstruction. No free air. There is lumbar levoscoliosis. There are multiple wedge compression fractures in the lumbar spine. Patient has had kyphoplasty procedure at L5. Lung bases are clear. IMPRESSION: Moderate stool throughout colon. No bowel obstruction or free air. Multiple compression fractures in the lumbar spine with lumbar dextroscoliosis. Lung bases clear. Electronically Signed   By: Lowella Grip III M.D.   On: 11/09/2015 13:50     Medications:     . aspirin EC  81 mg Oral Daily  . bisacodyl  10 mg Rectal Daily  . calcium carbonate  1 tablet Oral BID  . cefUROXime  500 mg Oral BID WC  . cholecalciferol  1,000 Units Oral Daily  . cyclobenzaprine  5 mg Oral TID  . docusate sodium  100 mg Oral BID  . enoxaparin (LOVENOX) injection  40 mg Subcutaneous Q24H  . lactulose  30 g Oral BID  . lisinopril  10 mg Oral BID  . methylPREDNISolone  4 mg Oral 4X daily taper  . metoprolol tartrate  50 mg Oral BID  . mirtazapine  30 mg Oral QHS  . PARoxetine  5 mg Oral Daily  . phenazopyridine  100 mg Oral TID WC  . potassium chloride  20 mEq Oral BID  . senna  1 tablet Oral BID  . sodium chloride  1 g Oral TID WC   acetaminophen **OR** acetaminophen, bisacodyl, HYDROcodone-acetaminophen, morphine injection, ondansetron **OR** ondansetron (ZOFRAN) IV, polyethylene glycol  Assessment/ Plan:  Ms. DANISA TRAGESSER is a 82 y.o. white female  with hypertension, depression, anxiety, atrial flutter, pacemaker, GERD, lichen planus, who was admitted to [facility] on 11/04/2015   1. Hyponatremia: underlying SIADH? Lower with administration of 0.9 saline.  - fluid restriction - PO sodium chloride.   2. Hypokalemia: did not receive diuretics. Most likely from saline infusion. Improved.  - agree with PO potassium supplementation.   3. Urinary Tract Infection: E. Coli -  cefuroxime  4. Hypertension: better control - metoprolol and lisinopril.    LOS: 3 Decklyn Hyder 8/4/20173:38 PM

## 2015-11-09 NOTE — Progress Notes (Signed)
[facility] NAME: Lori Henson    MR#:  [id number]  DATE OF BIRTH:  [birth year]  SUBJECTIVE:    Patient with constipation this am  REVIEW OF SYSTEMS:    Review of Systems  Constitutional: Negative for chills, fever and malaise/fatigue.  HENT: Negative.  Negative for ear discharge, ear pain, hearing loss, nosebleeds and sore throat.   Eyes: Negative.  Negative for blurred vision and pain.  Respiratory: Negative.  Negative for cough, hemoptysis, shortness of breath and wheezing.   Cardiovascular: Negative.  Negative for chest pain, palpitations and leg swelling.  Gastrointestinal: Positive for abdominal pain and constipation. Negative for blood in stool, diarrhea, nausea and vomiting.  Genitourinary: Negative.  Negative for dysuria.  Musculoskeletal: Negative.  Negative for back pain.  Skin: Negative.   Neurological: Positive for weakness. Negative for dizziness, tremors, speech change, focal weakness, seizures and headaches.  Endo/Heme/Allergies: Negative.  Does not bruise/bleed easily.  Psychiatric/Behavioral: Negative.  Negative for depression, hallucinations and suicidal ideas.    Tolerating Diet:yes      DRUG ALLERGIES:   Allergies  Allergen Reactions  . Macrobid [Nitrofurantoin Macrocrystal] Itching  . Nitrofurantoin Itching and Other (See Comments)  . Ranitidine Hcl Diarrhea  . Zantac [Ranitidine Hcl] Diarrhea    VITALS:  Blood pressure (!) 157/79, pulse 60, temperature 98.1 F (36.7 C), temperature source Oral, resp. rate 16, height 5\' 6"  (1.676 m), weight 62 kg (136 lb 11.2 oz), SpO2 98 %.  PHYSICAL EXAMINATION:   Physical Exam  Constitutional: She is oriented to person, place, and time and well-developed, well-nourished, and in no distress. No distress.  HENT:  Head: Normocephalic.  Eyes: No scleral icterus.  Neck: Normal range of motion. Neck supple. No JVD present. No tracheal deviation present.   Cardiovascular: Normal rate, regular rhythm and normal heart sounds.  Exam reveals no gallop and no friction rub.   No murmur heard. Pulmonary/Chest: Effort normal and breath sounds normal. No respiratory distress. She has no wheezes. She has no rales. She exhibits no tenderness.  Abdominal: Soft. Bowel sounds are normal. She exhibits distension. She exhibits no mass. There is no tenderness. There is no rebound and no guarding.  Musculoskeletal: Normal range of motion. She exhibits no edema.  Neurological: She is alert and oriented to person, place, and time.  Skin: Skin is warm. No rash noted. No erythema.  Psychiatric: Affect and judgment normal.      LABORATORY PANEL:   CBC  Recent Labs Lab 11/05/15 0406  WBC 9.6  HGB 12.1  HCT 34.2*  PLT 206   ------------------------------------------------------------------------------------------------------------------  Chemistries   Recent Labs Lab 11/04/15 1420  11/07/15 0521  11/09/15 0622  NA 124*  < > 121*  < > 128*  K 4.0  < > 3.1*  < > 3.8  CL 91*  < > 88*  < > 96*  CO2 24  < > 24  < > 25  GLUCOSE 115*  < > 111*  < > 180*  BUN 14  < > 14  < > 20  CREATININE 0.53  < > 0.40*  < > 0.64  CALCIUM 8.4*  < > 8.0*  < > 8.1*  MG  --   --  1.6*  --   --   AST 30  --   --   --   --   ALT 16  --   --   --   --   [facility]  94  --   --   --   --   BILITOT 0.8  --   --   --   --   < > = values in this interval not displayed. ------------------------------------------------------------------------------------------------------------------  Cardiac Enzymes No results for input(s): TROPONINI in the last 168 hours. ------------------------------------------------------------------------------------------------------------------  RADIOLOGY:  No results found.   ASSESSMENT AND PLAN:   82 yo female w/ hx of HTN, GERD, a. Fib/flutter s/p pacemaker, hx of Melanoma, who presented to the hospital due to back pain, abdominal pain and  noted to have urinary tract infection.  1. Urinary tract infection-urine cultures growing E. Coli which is pansensitive.  She was  switchwedfrom IV Ceftriaxone to Oral Ceftin.  She is currently afebrile and hemodynamically stable.  2. Hyponatremia: This is from SIADH.  Despite getting IV fluids patient's sodium went down. Sodium improved. She needs 1200 cc fluid restriction. She will follow up with NEPHROLOGY early next week for repeat BMP.   3. Back pain: Likely musculoskeletal in nature. Continue Medrol dosepak. PT evaluation recommending SNF at discharge.  4. Essential hypertension: BP and HR elevated this am. I made some subtle changes to metoprolol for twice a day administration versus once a day.  Continue lisinopril.  5. Anxiety: Continue Paxil   6. Abdominal pain with constipation: Check KUB to evaluate for ileus. Continue laxatives and enema.      Management plans discussed with the patient and she is in agreement.  CODE STATUS: full  TOTAL TIME TAKING CARE OF THIS PATIENT: 30 minutes.     POSSIBLE D/C today, DEPENDING ON CLINICAL CONDITION.   Jashawna Reever M.D on 11/09/2015 at 12:42 PM  Between 7am to 6pm - Pager - [phone] After 6pm go to [email or web address] - password [email or web address] [facility]  Office  [phone]  CC: Primary care physician; Einar Pheasant, MD  Note: This dictation was prepared with Dragon dictation along with smaller phrase technology. Any transcriptional errors that result from this process are unintentional.

## 2015-11-09 NOTE — Care Management Important Message (Signed)
Important Message  Patient Details  Name: Lori Henson MRN: [id number] Date of Birth: [birth year]   Medicare Important Message Given:  Yes    Alvie Heidelberg, RN 11/09/2015, 9:34 AM

## 2015-11-09 NOTE — Progress Notes (Signed)
Dr. Benjie Karvonen notified of patient's HR 130's and BP 160/110. Dr. Benjie Karvonen at patient's bedside.

## 2015-11-12 LAB — BASIC METABOLIC PANEL
ANION GAP: 8 (ref 5–15)
BUN: 26 mg/dL — ABNORMAL HIGH (ref 6–20)
CALCIUM: 8.9 mg/dL (ref 8.9–10.3)
CO2: 29 mmol/L (ref 22–32)
Chloride: 88 mmol/L — ABNORMAL LOW (ref 101–111)
Creatinine, Ser: 0.67 mg/dL (ref 0.44–1.00)
GFR calc Af Amer: >60 mL/min (ref >60)
GFR calc non Af Amer: >60 mL/min (ref >60)
GLUCOSE: 94 mg/dL (ref 65–99)
Potassium: 4.5 mmol/L (ref 3.5–5.1)
Sodium: 125 mmol/L — ABNORMAL LOW (ref 135–145)

## 2015-11-15 LAB — BASIC METABOLIC PANEL
Anion gap: 7 (ref 5–15)
BUN: 29 mg/dL — AB (ref 6–20)
CHLORIDE: 92 mmol/L — AB (ref 101–111)
CO2: 29 mmol/L (ref 22–32)
CREATININE: 0.86 mg/dL (ref 0.44–1.00)
Calcium: 8.6 mg/dL — ABNORMAL LOW (ref 8.9–10.3)
GFR calc Af Amer: >60 mL/min (ref >60)
GFR calc non Af Amer: 56 mL/min — ABNORMAL LOW (ref >60)
GLUCOSE: 97 mg/dL (ref 65–99)
Potassium: 4 mmol/L (ref 3.5–5.1)
SODIUM: 128 mmol/L — AB (ref 135–145)

## 2015-11-20 LAB — BASIC METABOLIC PANEL
ANION GAP: 6 (ref 5–15)
BUN: 21 mg/dL — ABNORMAL HIGH (ref 6–20)
CALCIUM: 8.1 mg/dL — AB (ref 8.9–10.3)
CO2: 27 mmol/L (ref 22–32)
Chloride: 99 mmol/L — ABNORMAL LOW (ref 101–111)
Creatinine, Ser: 0.7 mg/dL (ref 0.44–1.00)
GLUCOSE: 97 mg/dL (ref 65–99)
POTASSIUM: 4 mmol/L (ref 3.5–5.1)
Sodium: 132 mmol/L — ABNORMAL LOW (ref 135–145)

## 2015-11-23 ENCOUNTER — Non-Acute Institutional Stay (SKILLED_NURSING_FACILITY): Payer: Medicare Other | Admitting: Gerontology

## 2015-11-23 DIAGNOSIS — I10 Essential (primary) hypertension: Secondary | ICD-10-CM

## 2015-11-23 DIAGNOSIS — F418 Other specified anxiety disorders: Secondary | ICD-10-CM | POA: Diagnosis not present

## 2015-11-23 DIAGNOSIS — M791 Myalgia, unspecified site: Secondary | ICD-10-CM

## 2015-11-23 DIAGNOSIS — K5900 Constipation, unspecified: Secondary | ICD-10-CM

## 2015-11-23 DIAGNOSIS — E871 Hypo-osmolality and hyponatremia: Secondary | ICD-10-CM

## 2015-11-24 NOTE — Progress Notes (Signed)
Location:      Place of Service:  SNF (31) Provider:  Toni Arthurs, NP-C  Einar Pheasant, MD  Patient Care Team: Einar Pheasant, MD as PCP - General (Internal Medicine) Einar Pheasant, MD (Internal Medicine)  Extended Emergency Contact Information Primary Emergency Contact: Pester,Graham Address: [place].          Paschal Dopp, MD [place] Phone: [phone] Mobile Phone: [phone] Relation: Son Secondary Emergency Contact: Campbell,Janet ID Johnnette Litter of [place] Phone: [phone] Mobile Phone: [phone] Relation: Daughter  Code Status:  Full Goals of care: Advanced Directive information Advanced Directives 11/04/2015  Does patient have an advance directive? Yes  Type of Advance Directive Living will;Healthcare Power of Attorney  Does patient want to make changes to advanced directive? No - Patient declined  Copy of advanced directive(s) in chart? No - copy requested  Would patient like information on creating an advanced directive? -     Chief Complaint  Patient presents with  . Medical Management of Chronic Issues    HPI:  Pt is a 82 y.o. female seen today for medical management of chronic diseases. She has complaints of pain in the right bicep and lower back with spasms. The pain in the arm started yesterday. She has a palpable "knot" in the bicep but refuses xrays or CT. Complains of spasms in the lower back and Right hip. She is also here for rehab for weakness from hyponatremia that is slowly improving. She complains of constipation. Reports she has not has a BM since admission but can "feel" stool in the rectal vault that is large and hard. There is a history of depression with anxiety. She has been on Paxil in the past. Paxil has a high incidence of resulting hyponatremia. Medication regimen changed to attempt to prevent resulting hyponatremia. Lisinopril is also a known contributor to hyponatremia, which she has been taking  in higher doses for hypertension. B/P has been uncontrolled as well as tachycardia.      Past Medical History:  Diagnosis Date  . Angular cheilitis   . Anxiety   . Atrial flutter ([place])   . Bradycardia 2007   s/p pacemaker  . Dysrhythmia   . GERD (gastroesophageal reflux disease)   . Hypertension   . Lichen planus   . Malignant melanoma ([place]) 2001   right leg, s/p radiation, recurrence 2008  . Osteoporosis    fosamax, compression fx s/p kyphoplasty  . Presence of permanent cardiac pacemaker    Past Surgical History:  Procedure Laterality Date  . BACK SURGERY    . bladder support/spark sling  2007   s/p anterior repair with pubovaginal sling and cystoscopy  . cataract surgery  1998   x2  . CHOLECYSTECTOMY  2009  . EYE SURGERY Bilateral    Cataract Extraction  . FRACTURE SURGERY Left    fractured left wrist with plate inserted  . HEMORROIDECTOMY    . HERNIA REPAIR  1980  . INSERT / REPLACE / REMOVE PACEMAKER    . KYPHOPLASTY  2007   L-5  . PACEMAKER INSERTION N/A 03/07/2015   Procedure: / pacemaker change out;  Surgeon: Isaias Cowman, MD;  Location: [facility] ORS;  Service: Cardiovascular;  Laterality: N/A;  . PACEMAKER PLACEMENT  2007  . surgery for melanoma    . TONSILLECTOMY    . TONSILLECTOMY AND ADENOIDECTOMY  1933  . TUBAL LIGATION      Allergies  Allergen Reactions  . Macrobid [Nitrofurantoin Macrocrystal] Itching  .  Nitrofurantoin Itching and Other (See Comments)  . Ranitidine Hcl Diarrhea  . Zantac [Ranitidine Hcl] Diarrhea      Medication List       Accurate as of 11/23/15 11:59 PM. Always use your most recent med list.          aspirin EC 81 MG tablet Take 81 mg by mouth daily.   calcium carbonate 750 MG chewable tablet Commonly known as:  TUMS EX Chew 1 tablet by mouth 2 (two) times daily.   Casanthranol-Docusate Sodium 30-100 MG Caps Take 1 capsule by mouth as needed.   cefUROXime 500 MG tablet Commonly known as:  CEFTIN Take 1  tablet (500 mg total) by mouth 2 (two) times daily with a meal.   clonazePAM 0.5 MG tablet Commonly known as:  KLONOPIN 0.5 mg 2 (two) times daily as needed.   [employer] VITAMIN D3 1000 units capsule Generic drug:  Cholecalciferol Take 1,000 Units by mouth daily.   ICAPS Caps Take by mouth 2 (two) times daily.   lactulose 10 GM/15ML solution Commonly known as:  CHRONULAC Take 45 mLs (30 g total) by mouth 2 (two) times daily.   lisinopril 10 MG tablet Commonly known as:  PRINIVIL,ZESTRIL take 1 tablet by mouth twice a day   methylPREDNISolone 4 MG Tbpk tablet Commonly known as:  MEDROL DOSEPAK 4 mg dose pack taper for taper   metoprolol 50 MG tablet Commonly known as:  LOPRESSOR Take 1 tablet (50 mg total) by mouth 2 (two) times daily.   mirtazapine 30 MG tablet Commonly known as:  REMERON Take by mouth.   PARoxetine 10 MG tablet Commonly known as:  PAXIL Take 0.5 tablets (5 mg total) by mouth daily.   polyethylene glycol packet Commonly known as:  MIRALAX / GLYCOLAX Take 17 g by mouth daily as needed for mild constipation.   senna 8.6 MG Tabs tablet Commonly known as:  SENOKOT Take 1 tablet (8.6 mg total) by mouth 2 (two) times daily.   sorbitol 70 % Soln use if needed       Review of Systems  Constitutional: Negative for activity change, appetite change, chills, diaphoresis and fever.  HENT: Negative for congestion, sneezing, sore throat, trouble swallowing and voice change.   Eyes: Negative for pain, redness and visual disturbance.  Respiratory: Negative for apnea, cough, choking, chest tightness, shortness of breath and wheezing.   Cardiovascular: Negative for chest pain, palpitations and leg swelling.  Gastrointestinal: Positive for abdominal distention and constipation. Negative for abdominal pain, diarrhea and nausea.  Genitourinary: Negative for difficulty urinating, dysuria, frequency and urgency.  Musculoskeletal: Positive for arthralgias (typical  arthritis), back pain, gait problem and myalgias.  Skin: Negative for color change, pallor, rash and wound.  Neurological: Negative for dizziness, tremors, syncope, speech difficulty, weakness, numbness and headaches.  Psychiatric/Behavioral: Negative for agitation and behavioral problems.  All other systems reviewed and are negative.   Immunization History  Administered Date(s) Administered  . Influenza Split 02/06/2012, 02/10/2014  . Influenza,inj,Quad PF,36+ Mos 02/17/2013, 01/12/2015   Pertinent  Health Maintenance Due  Topic Date Due  . DEXA SCAN  01/10/1987  . PNA vac Low Risk Adult (1 of 2 - PCV13) 01/10/1987  . INFLUENZA VACCINE  11/06/2015  . MAMMOGRAM  05/14/2020 (Originally 08/23/2013)   Fall Risk  03/09/2014 02/17/2013 11/30/2012 02/06/2012  Falls in the past year? No Yes Yes -  Number falls in past yr: - 1 1 -  Injury with Fall? - Yes Yes -  Risk  Factor Category  - High Fall Risk - -  Risk for fall due to : - Impaired balance/gait;Impaired mobility - History of fall(s)   Functional Status Survey:    Vitals:   11/23/15 0500  BP: 121/78  Pulse: (!) 109  Resp: 18  Temp: 98.8 F (37.1 C)  SpO2: 93%   There is no height or weight on file to calculate BMI. Physical Exam  Constitutional: She is oriented to person, place, and time. Vital signs are normal. She appears well-developed and well-nourished. She is active and cooperative. She does not appear ill. No distress.  HENT:  Head: Normocephalic and atraumatic.  Mouth/Throat: Uvula is midline, oropharynx is clear and moist and mucous membranes are normal. Mucous membranes are not pale, not dry and not cyanotic.  Eyes: Conjunctivae, EOM and lids are normal. Pupils are equal, round, and reactive to light.  Neck: Trachea normal, normal range of motion and full passive range of motion without pain. Neck supple. No JVD present. No tracheal deviation, no edema and no erythema present. No thyromegaly present.  Cardiovascular:  Normal heart sounds, intact distal pulses and normal pulses.  An irregular rhythm present. Tachycardia present.  Exam reveals no gallop, no distant heart sounds and no friction rub.   No murmur heard. Pulmonary/Chest: Effort normal and breath sounds normal. No accessory muscle usage. No respiratory distress. She has no wheezes. She exhibits no tenderness.  Abdominal: Normal appearance and bowel sounds are normal. She exhibits no distension and no ascites. There is no tenderness.  Musculoskeletal: Normal range of motion. She exhibits no edema.       Right hip: She exhibits tenderness.       Right upper arm: She exhibits tenderness and swelling.       Arms: Expected osteoarthritis, stiffness, back and hip spasms. Calves soft, supple; negative homan's sign  Right Bicep- sore, weakness. Palpable "knot" in muscle; no redness, warmth or bruising  Neurological: She is alert and oriented to person, place, and time. She has normal strength.  Skin: Skin is warm, dry and intact. She is not diaphoretic. No cyanosis. No pallor. Nails show no clubbing.  Psychiatric: She has a normal mood and affect. Her speech is normal and behavior is normal. Judgment and thought content normal. Cognition and memory are normal.  Nursing note and vitals reviewed.   Labs reviewed:  Recent Labs  11/07/15 0521  11/12/15 0605 11/15/15 0653 11/20/15 0644  NA 121*  < > 125* 128* 132*  K 3.1*  < > 4.5 4.0 4.0  CL 88*  < > 88* 92* 99*  CO2 24  < > _0 GLUCOSE 111*  < > 94 97 97  BUN 14  < > 26* 29* 21*  CREATININE 0.40*  < > 0.67 0.86 0.70  CALCIUM 8.0*  < > 8.9 8.6* 8.1*  MG 1.6*  --   --   --   --   < > = values in this interval not displayed.  Recent Labs  01/12/15 1508 09/18/15 1602 11/04/15 1420  AST _1 ALT _2 ALKPHOS 89 70 94  BILITOT 0.3 0.3 0.8  PROT 6.8 6.7 7.1  ALBUMIN 3.6 3.4* 3.5    Recent Labs  03/05/15 1159 09/18/15 1602 11/04/15 1420 11/05/15 0406  WBC 6.0 6.0 10.2  9.6  NEUTROABS 3.7 3.5  --   --   HGB 12.4 12.8 12.5 12.1  HCT 38.1 38.2 34.8* 34.2*  MCV 91.9  90.5 88.6 87.7  PLT 192 239.0 215 206   Lab Results  Component Value Date   TSH 2.47 09/18/2015   No results found for: HGBA1C No results found for: CHOL, HDL, LDLCALC, LDLDIRECT, TRIG, CHOLHDL  Significant Diagnostic Results in last 30 days:  Dg Abd 1 View  Result Date: 11/09/2015 CLINICAL DATA:  Constipation and abdominal pain EXAM: ABDOMEN - 1 VIEW COMPARISON:  CT abdomen and pelvis November 04, 2015 FINDINGS: There is moderate stool throughout colon. There is no bowel dilatation or air-fluid level suggesting obstruction. No free air. There is lumbar levoscoliosis. There are multiple wedge compression fractures in the lumbar spine. Patient has had kyphoplasty procedure at L5. Lung bases are clear. IMPRESSION: Moderate stool throughout colon. No bowel obstruction or free air. Multiple compression fractures in the lumbar spine with lumbar dextroscoliosis. Lung bases clear. Electronically Signed   By: Lowella Grip III M.D.   On: 11/09/2015 13:50   Ct Abdomen Pelvis W Contrast  Result Date: 11/04/2015 CLINICAL DATA:  Right lower quadrant abdominal pain for 3 days. EXAM: CT ABDOMEN AND PELVIS WITH CONTRAST TECHNIQUE: Multidetector CT imaging of the abdomen and pelvis was performed using the standard protocol following bolus administration of intravenous contrast. CONTRAST:  15m ISOVUE-300 IOPAMIDOL (ISOVUE-300) INJECTION 61% COMPARISON:  02/02/2014 FINDINGS: Lower chest: Chronic basilar scarring changes. 5 mm nodule at the left lung base. The heart is enlarged but stable. No pericardial effusion. The distal esophagus is grossly normal. Hepatobiliary: No focal hepatic lesions. No intrahepatic biliary dilatation. The gallbladder is surgically absent. Mild associated common bile duct dilatation. Pancreas: No mass, inflammation or ductal dilatation. Spleen: Normal size.  No focal lesions. Adrenals/Urinary  Tract: The adrenal glands are normal. Small bilateral renal cysts. No worrisome renal lesions, hydronephrosis or renal or obstructing ureteral calculi. No bladder calculi. Stomach/Bowel: The stomach, duodenum, small bowel and colon are grossly normal. No inflammatory changes, mass lesions or obstructive findings. The appendix is normal. The terminal ileum is normal. Vascular/Lymphatic: No mesenteric or retroperitoneal mass or lymphadenopathy. There is marked tortuosity, ectasia and calcification of the abdominal aorta and branch vessels. No focal aneurysm or dissection. The major venous structures are patent. A duplicated IVC is noted. Other: No ascites or abdominal wall hernia. Musculoskeletal: No significant bony findings. There are numerous remote compression fractures involving the lumbar spine with vertebral augmentation changes noted at L5. IMPRESSION: No acute abdominal/ pelvic findings, mass lesions or lymphadenopathy. Numerous renal cysts but no worrisome renal lesions or obstructive findings. Scarring changes involving the left kidney. Tortuous ectatic and calcified abdominal aorta and branch vessels. The bladder wall is slightly thickened. Possible mild inflammation could suggest cystitis. Small ovarian cysts. Numerous lumbar compression fractures, stable. Electronically Signed   By: PMarijo SanesM.D.   On: 11/04/2015 17:00   Assessment/Plan 1. Hyponatremia  Dc Lisinopril  1200 mL fluid restriction per day  Gatorade BID  2. Depression with anxiety  Continue Mirtazepine 30 mg po Q HS  Effexor KL 75 mg po Q day  Continue Clonazepam 0.5 mg- 1/2 tablet BID prn for anxiety   3. Muscle pain  Voltaren gel 1%- 2 grams QID to Right Bicept and lower back  Schedule Methocarbamol 500 mg po BID  Schedule Tylenol 650 mg po QID for pain  4. Constipation, unspecified constipation type  Change pericolace to BID scheduled instead of prn  Sorbitol 70%- 30 mL po Q 2 hours until BM as needed  for severe constipation  5. Hypertension  Increase Coreg to  12.5 mg PO BID  Increase metoprolol to 100 mg BID  Family/ staff Communication:   Total Time: 40 minutes  Documentation: 20 minutes  Face to Face: 20 minutes  Family/Phone:   Labs/tests ordered:  CBC, Met C, mag+, B12, D  Medication list reviewed and assessed for continued appropriateness. Monthly medication orders reviewed and signed.  Vikki Ports, NP-C Geriatrics [facility] [phone], [place] [address] Cell Phone (Mon-Fri 8am-5pm):  [phone] On Call:  [phone] & follow prompts after 5pm & weekends Office Phone:  [phone] Office Fax:  [phone]

## 2015-11-27 LAB — COMPREHENSIVE METABOLIC PANEL
ALBUMIN: 2.9 g/dL — AB (ref 3.5–5.0)
ALK PHOS: 102 U/L (ref 38–126)
ALT: 17 U/L (ref 14–54)
ANION GAP: 6 (ref 5–15)
AST: 22 U/L (ref 15–41)
BUN: 17 mg/dL (ref 6–20)
CALCIUM: 8.1 mg/dL — AB (ref 8.9–10.3)
CHLORIDE: 96 mmol/L — AB (ref 101–111)
CO2: 27 mmol/L (ref 22–32)
CREATININE: 0.69 mg/dL (ref 0.44–1.00)
GFR calc non Af Amer: >60 mL/min (ref >60)
GLUCOSE: 93 mg/dL (ref 65–99)
Potassium: 3.8 mmol/L (ref 3.5–5.1)
SODIUM: 129 mmol/L — AB (ref 135–145)
Total Bilirubin: 0.6 mg/dL (ref 0.3–1.2)
Total Protein: 5.8 g/dL — ABNORMAL LOW (ref 6.5–8.1)

## 2015-11-27 LAB — CBC WITH DIFFERENTIAL/PLATELET
BASOS ABS: 0.1 10*3/uL (ref 0–0.1)
BASOS PCT: 2 %
EOS ABS: 0.2 10*3/uL (ref 0–0.7)
EOS PCT: 3 %
HCT: 34.9 % — ABNORMAL LOW (ref 35.0–47.0)
HEMOGLOBIN: 12 g/dL (ref 12.0–16.0)
Lymphocytes Relative: 18 %
Lymphs Abs: 1.1 10*3/uL (ref 1.0–3.6)
MCH: 31.3 pg (ref 26.0–34.0)
MCHC: 34.5 g/dL (ref 32.0–36.0)
MCV: 90.7 fL (ref 80.0–100.0)
Monocytes Absolute: 0.7 10*3/uL (ref 0.2–0.9)
Monocytes Relative: 12 %
NEUTROS PCT: 65 %
Neutro Abs: 4 10*3/uL (ref 1.4–6.5)
PLATELETS: 205 10*3/uL (ref 150–440)
RBC: 3.85 MIL/uL (ref 3.80–5.20)
RDW: 14.9 % — ABNORMAL HIGH (ref 11.5–14.5)
WBC: 6.1 10*3/uL (ref 3.6–11.0)

## 2015-11-27 LAB — MAGNESIUM: Magnesium: 2 mg/dL (ref 1.7–2.4)

## 2015-11-27 LAB — VITAMIN B12: VITAMIN B 12: 615 pg/mL (ref 180–914)

## 2015-11-28 LAB — VITAMIN D 25 HYDROXY (VIT D DEFICIENCY, FRACTURES): VIT D 25 HYDROXY: 34.4 ng/mL (ref 30.0–100.0)

## 2015-12-03 ENCOUNTER — Telehealth: Payer: Self-pay | Admitting: *Deleted

## 2015-12-03 NOTE — Telephone Encounter (Signed)
Lori Henson from [facility] has requested to have a social work order,due to her son requested more community resources to help with her care

## 2015-12-03 NOTE — Telephone Encounter (Signed)
[place] for social work consult.

## 2015-12-03 NOTE — Telephone Encounter (Signed)
Verbal order given to Tasha. thanks

## 2015-12-03 NOTE — Telephone Encounter (Signed)
Spoke with Aniceto Boss at [facility], patient was discharged from [facility] on the 26th.  Started in home PT on the 26th.  No other services have been ordered.  Per Aniceto Boss the son is overwhelmed and needs assistance which is why they want the social work consult.  Thanks

## 2015-12-03 NOTE — Telephone Encounter (Signed)
If she is in a skilled facility and someone is rounding on her and see her and giving orders, they need to order the social work consultation.  I reviewed last note from NP.  Need to confirm that this is the case.  If so, then they need to order because they are currently seeing her.  If problems, let me know.

## 2015-12-03 NOTE — Telephone Encounter (Signed)
Please advise for order for social work, looks like [facility] is where she is, she has not followed up since hospital discharge yet with you. thanks

## 2015-12-04 ENCOUNTER — Telehealth: Payer: Self-pay | Admitting: *Deleted

## 2015-12-04 NOTE — Telephone Encounter (Signed)
Left a VM for Lori Henson to return my call, social worker consult/referral approved yesterday and called to [place].  Thanks

## 2015-12-04 NOTE — Telephone Encounter (Signed)
Lori Henson from [facility] has requested  A Verbal order for start of care 3x a week for 2 weeks and 2x a week for 4weeks. She also requested a Education officer, museum for evaluation for the home.  Contact [phone]

## 2015-12-18 ENCOUNTER — Ambulatory Visit (INDEPENDENT_AMBULATORY_CARE_PROVIDER_SITE_OTHER): Payer: Medicare Other | Admitting: Psychiatry

## 2015-12-18 ENCOUNTER — Telehealth: Payer: Self-pay | Admitting: *Deleted

## 2015-12-18 ENCOUNTER — Encounter: Payer: Self-pay | Admitting: Psychiatry

## 2015-12-18 VITALS — BP 154/83 | HR 69 | Temp 98.7°F | Ht 66.0 in | Wt 135.4 lb

## 2015-12-18 DIAGNOSIS — F4321 Adjustment disorder with depressed mood: Secondary | ICD-10-CM

## 2015-12-18 DIAGNOSIS — F331 Major depressive disorder, recurrent, moderate: Secondary | ICD-10-CM

## 2015-12-18 DIAGNOSIS — Z634 Disappearance and death of family member: Secondary | ICD-10-CM | POA: Diagnosis not present

## 2015-12-18 MED ORDER — MIRTAZAPINE 30 MG PO TABS: 30.0000 mg | ORAL_TABLET | Freq: Every day | ORAL | 1 refills | Status: AC

## 2015-12-18 MED ORDER — VENLAFAXINE HCL ER 75 MG PO CP24: 75.0000 mg | ORAL_CAPSULE | Freq: Every day | ORAL | 1 refills | Status: AC

## 2015-12-18 MED ORDER — CLONAZEPAM 0.5 MG PO TABS: 0.2500 mg | ORAL_TABLET | Freq: Every day | ORAL | 1 refills | Status: AC

## 2015-12-18 NOTE — Telephone Encounter (Signed)
FYI Patients daughter will bring a veteran administration form to be completed. This form is for aid and attendance for in home care

## 2015-12-18 NOTE — Telephone Encounter (Signed)
Please advise, form is not here yet. thanks

## 2015-12-18 NOTE — Progress Notes (Signed)
BH MD/PA/NP OP Progress Note  12/18/2015 2:33 PM Lori Henson  MRN:  [id number]  Subjective:    Patient is a 82 year old married female who presented for the follow-up appointment. She uses walker to ambulate. She reported that she is becoming more weak and was admitted to Hospital for 5 days and then transferred to [facility] for 3 weeks for rehab and PT. She stated that she is having back pain and becoming more weak and tired. Her meds were adjusted and she was started on Effexor and she stated that she  feels so bad due to back pain that she does not think about depression any longer. She is interested in her children and things around them. Her grand daughter just moved to [place]. Patient was pleasant and cooperative during the interview. She was able to discuss her medications in detail. She currently denied having any perceptual disturbances. She denied having any suicidal homicidal ideations or plans. She stated that she still naps during the day time. She denied A/V hallucinations and plans. she lives in an independent living and has several friends over there. She reported that one of the friends takes care and helps with her medications.  Chief Complaint:  Chief Complaint    Medication Refill; Follow-up     Visit Diagnosis:     ICD-9-CM ICD-10-CM   1. MDD (major depressive disorder), recurrent episode, moderate (HCC) 296.32 F33.1   2. Bereavement due to life event 309.0 F43.21    V62.89 Z63.4     Past Medical History:  Past Medical History:  Diagnosis Date  . Angular cheilitis   . Anxiety   . Atrial flutter ([place])   . Bradycardia 2007   s/p pacemaker  . Dysrhythmia   . GERD (gastroesophageal reflux disease)   . Hypertension   . Lichen planus   . Malignant melanoma ([place]) 2001   right leg, s/p radiation, recurrence 2008  . Osteoporosis    fosamax, compression fx s/p kyphoplasty  . Presence of permanent cardiac pacemaker     Past Surgical History:  Procedure  Laterality Date  . BACK SURGERY    . bladder support/spark sling  2007   s/p anterior repair with pubovaginal sling and cystoscopy  . cataract surgery  1998   x2  . CHOLECYSTECTOMY  2009  . EYE SURGERY Bilateral    Cataract Extraction  . FRACTURE SURGERY Left    fractured left wrist with plate inserted  . HEMORROIDECTOMY    . HERNIA REPAIR  1980  . INSERT / REPLACE / REMOVE PACEMAKER    . KYPHOPLASTY  2007   L-5  . PACEMAKER INSERTION N/A 03/07/2015   Procedure: / pacemaker change out;  Surgeon: Isaias Cowman, MD;  Location: [facility] ORS;  Service: Cardiovascular;  Laterality: N/A;  . PACEMAKER PLACEMENT  2007  . surgery for melanoma    . TONSILLECTOMY    . TONSILLECTOMY AND ADENOIDECTOMY  1933  . TUBAL LIGATION     Family History:  Family History  Problem Relation Age of Onset  . Heart disease Mother   . Heart disease Father     died age 85  . Stroke Sister   . Kidney disease Brother   . Uterine cancer Maternal Aunt   . Ovarian cancer      cousin   Social History:  Social History   Social History  . Marital status: Widowed    Spouse name: N/A  . Number of children: 4  . Years of education:  N/A   Social History Main Topics  . Smoking status: Never Smoker  . Smokeless tobacco: Never Used  . Alcohol use No  . Drug use: No  . Sexual activity: No   Other Topics Concern  . None   Social History Narrative  . None   Additional History:  Lives in retirement center.   Assessment:   Musculoskeletal: Strength & Muscle Tone: within normal limits Gait & Station: slow with walker Patient leans: N/A  Psychiatric Specialty Exam: HPI  Review of Systems  Psychiatric/Behavioral: Positive for depression. The patient is nervous/anxious.   All other systems reviewed and are negative.   Blood pressure (!) 154/83, pulse 69, temperature 98.7 F (37.1 C), temperature source Tympanic, height 5\' 6"  (1.676 m), weight 135 lb 6.4 oz (61.4 kg).Body mass index is 21.85  kg/m.  General Appearance: Casual  Eye Contact:  Fair  Speech:  Clear and Coherent  Volume:  Decreased  Mood:  Anxious  Affect:  Congruent  Thought Process:  Coherent  Orientation:  Full (Time, Place, and Person)  Thought Content:  WDL  Suicidal Thoughts:  No  Homicidal Thoughts:  No  Memory:  Immediate;   Fair  Judgement:  Fair  Insight:  Fair  Psychomotor Activity:  Decreased  Concentration:  Fair  Recall:  [employer] of Knowledge: Fair  Language: Fair  Akathisia:  No  Handed:  Right  AIMS (if indicated):    Assets:  Communication Skills Social Support  ADL's:  Intact  Cognition: WNL  Sleep:     Is the patient at risk to self?  No. Has the patient been a risk to self in the past 6 months?  No. Has the patient been a risk to self within the distant past?  No. Is the patient a risk to others?  No. Has the patient been a risk to others in the past 6 months?  No. Has the patient been a risk to others within the distant past?  No.  Current Medications: Current Outpatient Prescriptions  Medication Sig Dispense Refill  . aspirin EC 81 MG tablet Take 81 mg by mouth daily.    . calcium carbonate (TUMS EX) 750 MG chewable tablet Chew 1 tablet by mouth 2 (two) times daily.    . carvedilol (COREG) 12.5 MG tablet     . Casanthranol-Docusate Sodium 30-100 MG CAPS Take 1 capsule by mouth as needed.     . cefUROXime (CEFTIN) 500 MG tablet Take 1 tablet (500 mg total) by mouth 2 (two) times daily with a meal. 10 tablet 0  . Cholecalciferol ([employer]S VITAMIN D3) 1000 UNITS capsule Take 1,000 Units by mouth daily.    . clonazePAM (KLONOPIN) 0.5 MG tablet 0.5 mg 2 (two) times daily as needed.     . lactulose (CHRONULAC) 10 GM/15ML solution Take 45 mLs (30 g total) by mouth 2 (two) times daily. 240 mL 0  . methylPREDNISolone (MEDROL DOSEPAK) 4 MG TBPK tablet 4 mg dose pack taper for taper 21 tablet 0  . metoprolol (LOPRESSOR) 50 MG tablet Take 1 tablet (50 mg total) by mouth 2 (two) times  daily. 60 tablet 0  . mirtazapine (REMERON) 30 MG tablet Take by mouth.    . Multiple Vitamins-Minerals (ICAPS) CAPS Take by mouth 2 (two) times daily.     Marland Kitchen PARoxetine (PAXIL) 10 MG tablet Take 0.5 tablets (5 mg total) by mouth daily. 30 tablet 0  . sorbitol 70 % SOLN use if needed 1000 mL 3  No current facility-administered medications for this visit.     Medical Decision Making:  Established Problem, Stable/Improving (1) and Review of Psycho-Social Stressors (1)  Treatment Plan Summary:Medication management   Depression  She will continue on Remeron 30 mg daily She will continue on Effexor XR 75 mg in the morning.     Anxiety Decrease Klonopin 0.25 mg by mouth daily at bedtime  Follow-up 2 Months or earlier depending on her symptoms   More than 50% of the time spent in psychoeducation, counseling and coordination of care.    This note was generated in part or whole with voice recognition software. Voice regonition is usually quite accurate but there are transcription errors that can and very often do occur. I apologize for any typographical errors that were not detected and corrected.    Rainey Pines, MD  12/18/2015, 2:33 PM

## 2015-12-21 ENCOUNTER — Encounter: Payer: Self-pay | Admitting: Internal Medicine

## 2015-12-21 ENCOUNTER — Ambulatory Visit (INDEPENDENT_AMBULATORY_CARE_PROVIDER_SITE_OTHER): Payer: Medicare Other

## 2015-12-21 ENCOUNTER — Ambulatory Visit (INDEPENDENT_AMBULATORY_CARE_PROVIDER_SITE_OTHER): Payer: Medicare Other | Admitting: Internal Medicine

## 2015-12-21 VITALS — BP 160/90 | HR 71 | Temp 97.6°F | Ht 66.0 in | Wt 132.8 lb

## 2015-12-21 DIAGNOSIS — K5900 Constipation, unspecified: Secondary | ICD-10-CM

## 2015-12-21 DIAGNOSIS — I10 Essential (primary) hypertension: Secondary | ICD-10-CM

## 2015-12-21 DIAGNOSIS — R2681 Unsteadiness on feet: Secondary | ICD-10-CM

## 2015-12-21 DIAGNOSIS — M546 Pain in thoracic spine: Secondary | ICD-10-CM | POA: Diagnosis not present

## 2015-12-21 DIAGNOSIS — I4892 Unspecified atrial flutter: Secondary | ICD-10-CM

## 2015-12-21 DIAGNOSIS — E871 Hypo-osmolality and hyponatremia: Secondary | ICD-10-CM

## 2015-12-21 DIAGNOSIS — C439 Malignant melanoma of skin, unspecified: Secondary | ICD-10-CM

## 2015-12-21 DIAGNOSIS — K5909 Other constipation: Secondary | ICD-10-CM

## 2015-12-21 DIAGNOSIS — F329 Major depressive disorder, single episode, unspecified: Secondary | ICD-10-CM

## 2015-12-21 DIAGNOSIS — R600 Localized edema: Secondary | ICD-10-CM

## 2015-12-21 NOTE — Progress Notes (Signed)
Patient ID: Lori Henson, female   DOB: [birth year], 82 y.o.   MRN: [id number]   Subjective:    Patient ID: Lori Henson, female    DOB: 1919-11-11, 82 y.o.   MRN: [id number]  HPI  Patient here for a scheduled follow up.   She was admitted 11/04/15 with back pain, weakness, hyponatremia and UTI.  [facility] to [facility]  Now back at home.  She is accompanied by her daughter.  History obtained from both of them.  Her main complaint is that of back pain.  Increased pain.  Not taking anything now for pain.  Was taking tylenol, but states this did not make a big difference.  If she sits down or lies down, pain better.  Able to sleep.  She is eating.  Some decreased appetite.  No nausea or vomiting.  Has issues with constipation.  Bowels are moving.  Seeing psychiatry.  Stable.  Reviewed note.  No medication changes.     Past Medical History:  Diagnosis Date  . Angular cheilitis   . Anxiety   . Atrial flutter ([place])   . Bradycardia 2007   s/p pacemaker  . Dysrhythmia   . GERD (gastroesophageal reflux disease)   . Hypertension   . Lichen planus   . Malignant melanoma ([place]) 2001   right leg, s/p radiation, recurrence 2008  . Osteoporosis    fosamax, compression fx s/p kyphoplasty  . Presence of permanent cardiac pacemaker    Past Surgical History:  Procedure Laterality Date  . BACK SURGERY    . bladder support/spark sling  2007   s/p anterior repair with pubovaginal sling and cystoscopy  . cataract surgery  1998   x2  . CHOLECYSTECTOMY  2009  . EYE SURGERY Bilateral    Cataract Extraction  . FRACTURE SURGERY Left    fractured left wrist with plate inserted  . HEMORROIDECTOMY    . HERNIA REPAIR  1980  . INSERT / REPLACE / REMOVE PACEMAKER    . KYPHOPLASTY  2007   L-5  . PACEMAKER INSERTION N/A 03/07/2015   Procedure: / pacemaker change out;  Surgeon: Isaias Cowman, MD;  Location: [facility] ORS;  Service: Cardiovascular;  Laterality: N/A;  . PACEMAKER PLACEMENT  2007  . surgery for  melanoma    . TONSILLECTOMY    . TONSILLECTOMY AND ADENOIDECTOMY  1933  . TUBAL LIGATION     Family History  Problem Relation Age of Onset  . Heart disease Mother   . Heart disease Father     died age 33  . Stroke Sister   . Kidney disease Brother   . Uterine cancer Maternal Aunt   . Ovarian cancer      cousin   Social History   Social History  . Marital status: Widowed    Spouse name: N/A  . Number of children: 4  . Years of education: N/A   Social History Main Topics  . Smoking status: Never Smoker  . Smokeless tobacco: Never Used  . Alcohol use No  . Drug use: No  . Sexual activity: No   Other Topics Concern  . None   Social History Narrative  . None    Outpatient Encounter Prescriptions as of 12/21/2015  Medication Sig  . aspirin EC 81 MG tablet Take 81 mg by mouth daily.  . calcium carbonate (TUMS EX) 750 MG chewable tablet Chew 1 tablet by mouth 2 (two) times daily.  . carvedilol (COREG) 12.5 MG tablet   .  Casanthranol-Docusate Sodium 30-100 MG CAPS Take 1 capsule by mouth as needed.   . Cholecalciferol (CVS VITAMIN D3) 1000 UNITS capsule Take 1,000 Units by mouth daily.  . clonazePAM (KLONOPIN) 0.5 MG tablet Take 0.5 tablets (0.25 mg total) by mouth at bedtime.  Marland Kitchen lactulose (CHRONULAC) 10 GM/15ML solution Take 45 mLs (30 g total) by mouth 2 (two) times daily.  . metoprolol (LOPRESSOR) 50 MG tablet Take 1 tablet (50 mg total) by mouth 2 (two) times daily.  . mirtazapine (REMERON) 30 MG tablet Take 1 tablet (30 mg total) by mouth at bedtime.  . Multiple Vitamins-Minerals (ICAPS) CAPS Take by mouth 2 (two) times daily.   . sorbitol 70 % SOLN use if needed  . venlafaxine XR (EFFEXOR-XR) 75 MG 24 hr capsule Take 1 capsule (75 mg total) by mouth daily with breakfast.  . [DISCONTINUED] cefUROXime (CEFTIN) 500 MG tablet Take 1 tablet (500 mg total) by mouth 2 (two) times daily with a meal.  . [DISCONTINUED] methylPREDNISolone (MEDROL DOSEPAK) 4 MG TBPK tablet 4 mg  dose pack taper for taper   No facility-administered encounter medications on file as of 12/21/2015.     Review of Systems  Constitutional: Positive for fatigue.       Some decreased appetite.    HENT: Negative for congestion and sinus pressure.   Respiratory: Negative for cough, chest tightness and shortness of breath.   Cardiovascular: Negative for chest pain and palpitations.  Gastrointestinal: Positive for constipation. Negative for abdominal pain, diarrhea, nausea and vomiting.  Genitourinary: Negative for difficulty urinating and dysuria.  Musculoskeletal: Positive for back pain. Negative for myalgias.  Skin: Negative for color change and rash.  Neurological: Negative for dizziness, light-headedness and headaches.  Psychiatric/Behavioral: Negative for agitation and dysphoric mood.       Objective:     Blood pressure rechecked by me:  148/80  Physical Exam  Constitutional: She appears well-developed and well-nourished. No distress.  HENT:  Nose: Nose normal.  Mouth/Throat: Oropharynx is clear and moist.  Neck: Neck supple. No thyromegaly present.  Cardiovascular: Normal rate and regular rhythm.   Pulmonary/Chest: Breath sounds normal. No respiratory distress. She has no wheezes.  Abdominal: Soft. Bowel sounds are normal. There is no tenderness.  Musculoskeletal: She exhibits no tenderness.  Some pedal and ankle swelling.  Stable.   Lymphadenopathy:    She has no cervical adenopathy.  Skin: Skin is warm and dry. No rash noted.  Psychiatric: She has a normal mood and affect. Her behavior is normal.    BP (!) 160/90   Pulse 71   Temp 97.6 F (36.4 C) (Oral)   Ht 5\' 6"  (1.676 m)   Wt 132 lb 12.8 oz (60.2 kg)   SpO2 92%   BMI 21.43 kg/m  Wt Readings from Last 3 Encounters:  12/21/15 132 lb 12.8 oz (60.2 kg)  12/18/15 135 lb 6.4 oz (61.4 kg)  11/04/15 136 lb 11.2 oz (62 kg)     Lab Results  Component Value Date   WBC 6.1 11/27/2015   HGB 12.0 11/27/2015    HCT 34.9 (L) 11/27/2015   PLT 205 11/27/2015   GLUCOSE 93 11/27/2015   ALT 17 11/27/2015   AST 22 11/27/2015   NA 129 (L) 11/27/2015   K 3.8 11/27/2015   CL 96 (L) 11/27/2015   CREATININE 0.69 11/27/2015   BUN 17 11/27/2015   CO2 27 11/27/2015   TSH 2.47 09/18/2015   INR 1.01 03/05/2015    Dg Abd 1  View  Result Date: 11/09/2015 CLINICAL DATA:  Constipation and abdominal pain EXAM: ABDOMEN - 1 VIEW COMPARISON:  CT abdomen and pelvis November 04, 2015 FINDINGS: There is moderate stool throughout colon. There is no bowel dilatation or air-fluid level suggesting obstruction. No free air. There is lumbar levoscoliosis. There are multiple wedge compression fractures in the lumbar spine. Patient has had kyphoplasty procedure at L5. Lung bases are clear. IMPRESSION: Moderate stool throughout colon. No bowel obstruction or free air. Multiple compression fractures in the lumbar spine with lumbar dextroscoliosis. Lung bases clear. Electronically Signed   By: Lowella Grip III M.D.   On: 11/09/2015 13:50       Assessment & Plan:   Problem List Items Addressed This Visit    Atrial flutter ([place])    Followed by cardiology.  Due f/u 01/01/16.        Back pain - Primary    Increased pain.  Scans from hospital reviewed.  Will obtain thoracic and L-S spine xray.  Has a history of compression fractures.  See if any new fractures.  May need referral to ortho or Dr Sharlet Salina.        Relevant Orders   DG Thoracic Spine 2 View (Completed)   DG Lumbar Spine 2-3 Views (Completed)   Chronic constipation    Has been a chronic issue.  Stable.  Follow.        Hypertension    Blood pressure improved after recheck.  Need to clarify exact medications she is taking.  Hold on changes.  Follow.        Hyponatremia    Sodium low in the hospital.  Discussed recheck today.  She is seeing nephrology next week.  Wants to hold on checking labs today since they will be checking at her appt.  Follow.        Lower  extremity edema    Stable.        Major depressive disorder with single episode ([place])    Followed by psychiatry.  Per note states continue effexor and remeron.  Follow.  Stable.       Malignant melanoma ([place])    Followed by dermatology.       Unsteady gait    Uses her walker regularly.  Stable.         Other Visit Diagnoses   None.      Einar Pheasant, MD

## 2015-12-21 NOTE — Progress Notes (Signed)
Pre visit review using our clinic review tool, if applicable. No additional management support is needed unless otherwise documented below in the visit note. 

## 2015-12-23 ENCOUNTER — Encounter: Payer: Self-pay | Admitting: Internal Medicine

## 2015-12-23 NOTE — Assessment & Plan Note (Signed)
Followed by cardiology.  Due f/u 01/01/16.

## 2015-12-23 NOTE — Assessment & Plan Note (Signed)
Sodium low in the hospital.  Discussed recheck today.  She is seeing nephrology next week.  Wants to hold on checking labs today since they will be checking at her appt.  Follow.

## 2015-12-23 NOTE — Assessment & Plan Note (Signed)
Followed by dermatology

## 2015-12-23 NOTE — Assessment & Plan Note (Signed)
Increased pain.  Scans from hospital reviewed.  Will obtain thoracic and L-S spine xray.  Has a history of compression fractures.  See if any new fractures.  May need referral to ortho or Dr Sharlet Salina.

## 2015-12-23 NOTE — Assessment & Plan Note (Signed)
Uses her walker regularly.  Stable.

## 2015-12-23 NOTE — Assessment & Plan Note (Signed)
Stable

## 2015-12-23 NOTE — Assessment & Plan Note (Signed)
Has been a chronic issue.  Stable.  Follow.

## 2015-12-23 NOTE — Assessment & Plan Note (Signed)
Followed by psychiatry.  Per note states continue effexor and remeron.  Follow.  Stable.

## 2015-12-23 NOTE — Assessment & Plan Note (Signed)
Blood pressure improved after recheck.  Need to clarify exact medications she is taking.  Hold on changes.  Follow.

## 2015-12-25 ENCOUNTER — Emergency Department (HOSPITAL_COMMUNITY): Payer: Medicare Other

## 2015-12-25 ENCOUNTER — Inpatient Hospital Stay (HOSPITAL_COMMUNITY)
Admission: EM | Admit: 2015-12-25 | Discharge: 2016-01-06 | DRG: 064 | Disposition: E | Payer: Medicare Other | Attending: Internal Medicine | Admitting: Internal Medicine

## 2015-12-25 ENCOUNTER — Encounter (HOSPITAL_COMMUNITY): Payer: Self-pay | Admitting: Radiology

## 2015-12-25 DIAGNOSIS — R29724 NIHSS score 24: Secondary | ICD-10-CM | POA: Diagnosis present

## 2015-12-25 DIAGNOSIS — I472 Ventricular tachycardia: Secondary | ICD-10-CM | POA: Diagnosis present

## 2015-12-25 DIAGNOSIS — M4854XA Collapsed vertebra, not elsewhere classified, thoracic region, initial encounter for fracture: Secondary | ICD-10-CM | POA: Diagnosis present

## 2015-12-25 DIAGNOSIS — Z881 Allergy status to other antibiotic agents status: Secondary | ICD-10-CM

## 2015-12-25 DIAGNOSIS — Z515 Encounter for palliative care: Secondary | ICD-10-CM | POA: Diagnosis not present

## 2015-12-25 DIAGNOSIS — R402312 Coma scale, best motor response, none, at arrival to emergency department: Secondary | ICD-10-CM | POA: Diagnosis present

## 2015-12-25 DIAGNOSIS — R001 Bradycardia, unspecified: Secondary | ICD-10-CM

## 2015-12-25 DIAGNOSIS — I639 Cerebral infarction, unspecified: Secondary | ICD-10-CM | POA: Diagnosis present

## 2015-12-25 DIAGNOSIS — G8101 Flaccid hemiplegia affecting right dominant side: Secondary | ICD-10-CM | POA: Diagnosis present

## 2015-12-25 DIAGNOSIS — Z8041 Family history of malignant neoplasm of ovary: Secondary | ICD-10-CM

## 2015-12-25 DIAGNOSIS — C439 Malignant melanoma of skin, unspecified: Secondary | ICD-10-CM | POA: Diagnosis present

## 2015-12-25 DIAGNOSIS — I161 Hypertensive emergency: Secondary | ICD-10-CM | POA: Diagnosis not present

## 2015-12-25 DIAGNOSIS — M810 Age-related osteoporosis without current pathological fracture: Secondary | ICD-10-CM | POA: Diagnosis present

## 2015-12-25 DIAGNOSIS — Z950 Presence of cardiac pacemaker: Secondary | ICD-10-CM

## 2015-12-25 DIAGNOSIS — I495 Sick sinus syndrome: Secondary | ICD-10-CM | POA: Diagnosis present

## 2015-12-25 DIAGNOSIS — F419 Anxiety disorder, unspecified: Secondary | ICD-10-CM | POA: Diagnosis present

## 2015-12-25 DIAGNOSIS — I10 Essential (primary) hypertension: Secondary | ICD-10-CM | POA: Diagnosis present

## 2015-12-25 DIAGNOSIS — I63512 Cerebral infarction due to unspecified occlusion or stenosis of left middle cerebral artery: Secondary | ICD-10-CM

## 2015-12-25 DIAGNOSIS — R4701 Aphasia: Secondary | ICD-10-CM | POA: Diagnosis present

## 2015-12-25 DIAGNOSIS — I4891 Unspecified atrial fibrillation: Secondary | ICD-10-CM | POA: Diagnosis present

## 2015-12-25 DIAGNOSIS — F329 Major depressive disorder, single episode, unspecified: Secondary | ICD-10-CM | POA: Diagnosis present

## 2015-12-25 DIAGNOSIS — E871 Hypo-osmolality and hyponatremia: Secondary | ICD-10-CM | POA: Diagnosis present

## 2015-12-25 DIAGNOSIS — I4892 Unspecified atrial flutter: Secondary | ICD-10-CM | POA: Diagnosis present

## 2015-12-25 DIAGNOSIS — Z66 Do not resuscitate: Secondary | ICD-10-CM | POA: Diagnosis present

## 2015-12-25 DIAGNOSIS — R402142 Coma scale, eyes open, spontaneous, at arrival to emergency department: Secondary | ICD-10-CM | POA: Diagnosis present

## 2015-12-25 DIAGNOSIS — Z7189 Other specified counseling: Secondary | ICD-10-CM

## 2015-12-25 DIAGNOSIS — I63412 Cerebral infarction due to embolism of left middle cerebral artery: Secondary | ICD-10-CM | POA: Diagnosis not present

## 2015-12-25 DIAGNOSIS — Z923 Personal history of irradiation: Secondary | ICD-10-CM

## 2015-12-25 DIAGNOSIS — I63312 Cerebral infarction due to thrombosis of left middle cerebral artery: Secondary | ICD-10-CM

## 2015-12-25 DIAGNOSIS — Z8249 Family history of ischemic heart disease and other diseases of the circulatory system: Secondary | ICD-10-CM

## 2015-12-25 DIAGNOSIS — R402212 Coma scale, best verbal response, none, at arrival to emergency department: Secondary | ICD-10-CM | POA: Diagnosis present

## 2015-12-25 DIAGNOSIS — Z888 Allergy status to other drugs, medicaments and biological substances status: Secondary | ICD-10-CM

## 2015-12-25 DIAGNOSIS — R29810 Facial weakness: Secondary | ICD-10-CM | POA: Diagnosis present

## 2015-12-25 DIAGNOSIS — K219 Gastro-esophageal reflux disease without esophagitis: Secondary | ICD-10-CM | POA: Diagnosis present

## 2015-12-25 DIAGNOSIS — Z823 Family history of stroke: Secondary | ICD-10-CM

## 2015-12-25 DIAGNOSIS — G8929 Other chronic pain: Secondary | ICD-10-CM | POA: Diagnosis present

## 2015-12-25 DIAGNOSIS — Z8049 Family history of malignant neoplasm of other genital organs: Secondary | ICD-10-CM

## 2015-12-25 LAB — URINALYSIS, ROUTINE W REFLEX MICROSCOPIC
Bilirubin Urine: NEGATIVE
GLUCOSE, UA: NEGATIVE mg/dL
HGB URINE DIPSTICK: NEGATIVE
Ketones, ur: NEGATIVE mg/dL
LEUKOCYTES UA: NEGATIVE
Nitrite: NEGATIVE
PROTEIN: NEGATIVE mg/dL
SPECIFIC GRAVITY, URINE: 1.011 (ref 1.005–1.030)
pH: 7.5 (ref 5.0–8.0)

## 2015-12-25 LAB — COMPREHENSIVE METABOLIC PANEL
ALBUMIN: 3.3 g/dL — AB (ref 3.5–5.0)
ALT: 19 U/L (ref 14–54)
ANION GAP: 7 (ref 5–15)
AST: 26 U/L (ref 15–41)
Alkaline Phosphatase: 140 U/L — ABNORMAL HIGH (ref 38–126)
BILIRUBIN TOTAL: 0.8 mg/dL (ref 0.3–1.2)
BUN: 17 mg/dL (ref 6–20)
CALCIUM: 8.9 mg/dL (ref 8.9–10.3)
CO2: 26 mmol/L (ref 22–32)
Chloride: 101 mmol/L (ref 101–111)
Creatinine, Ser: 0.85 mg/dL (ref 0.44–1.00)
GFR calc non Af Amer: 57 mL/min — ABNORMAL LOW (ref >60)
GLUCOSE: 116 mg/dL — AB (ref 65–99)
POTASSIUM: 4.5 mmol/L (ref 3.5–5.1)
SODIUM: 134 mmol/L — AB (ref 135–145)
TOTAL PROTEIN: 6.6 g/dL (ref 6.5–8.1)

## 2015-12-25 LAB — CBC WITH DIFFERENTIAL/PLATELET
BASOS ABS: 0 10*3/uL (ref 0.0–0.1)
BASOS PCT: 1 %
Eosinophils Absolute: 0.1 10*3/uL (ref 0.0–0.7)
Eosinophils Relative: 1 %
HEMATOCRIT: 39.7 % (ref 36.0–46.0)
HEMOGLOBIN: 13 g/dL (ref 12.0–15.0)
LYMPHS PCT: 15 %
Lymphs Abs: 1.2 10*3/uL (ref 0.7–4.0)
MCH: 30.7 pg (ref 26.0–34.0)
MCHC: 32.7 g/dL (ref 30.0–36.0)
MCV: 93.6 fL (ref 78.0–100.0)
MONO ABS: 1.1 10*3/uL — AB (ref 0.1–1.0)
Monocytes Relative: 14 %
NEUTROS ABS: 5.6 10*3/uL (ref 1.7–7.7)
NEUTROS PCT: 69 %
Platelets: 275 10*3/uL (ref 150–400)
RBC: 4.24 MIL/uL (ref 3.87–5.11)
RDW: 16 % — AB (ref 11.5–15.5)
WBC: 8.1 10*3/uL (ref 4.0–10.5)

## 2015-12-25 LAB — TROPONIN I

## 2015-12-25 LAB — ETHANOL: Alcohol, Ethyl (B): <5 mg/dL (ref <5)

## 2015-12-25 LAB — APTT: aPTT: 32 seconds (ref 24–36)

## 2015-12-25 LAB — RAPID URINE DRUG SCREEN, HOSP PERFORMED
Amphetamines: NOT DETECTED
BENZODIAZEPINES: NOT DETECTED
Barbiturates: NOT DETECTED
COCAINE: NOT DETECTED
OPIATES: NOT DETECTED
Tetrahydrocannabinol: NOT DETECTED

## 2015-12-25 LAB — CBG MONITORING, ED: Glucose-Capillary: 113 mg/dL — ABNORMAL HIGH (ref 65–99)

## 2015-12-25 LAB — MRSA PCR SCREENING: MRSA by PCR: NEGATIVE

## 2015-12-25 LAB — PROTIME-INR
INR: 1.09
PROTHROMBIN TIME: 14.1 s (ref 11.4–15.2)

## 2015-12-25 MED ORDER — SODIUM CHLORIDE 0.9 % IV SOLN: 250.0000 mL | INTRAVENOUS | Status: DC | PRN

## 2015-12-25 MED ORDER — ONDANSETRON HCL 4 MG/2ML IJ SOLN: 4.0000 mg | Freq: Four times a day (QID) | INTRAMUSCULAR | Status: DC | PRN

## 2015-12-25 MED ORDER — LORAZEPAM 2 MG/ML IJ SOLN: 1.0000 mg | INTRAMUSCULAR | Status: DC | PRN

## 2015-12-25 MED ORDER — HALOPERIDOL 1 MG PO TABS: 0.5000 mg | ORAL_TABLET | ORAL | Status: DC | PRN

## 2015-12-25 MED ORDER — ACETAMINOPHEN 650 MG RE SUPP: 650.0000 mg | Freq: Four times a day (QID) | RECTAL | Status: DC | PRN

## 2015-12-25 MED ORDER — ONDANSETRON 4 MG PO TBDP: 4.0000 mg | ORAL_TABLET | Freq: Four times a day (QID) | ORAL | Status: DC | PRN

## 2015-12-25 MED ORDER — IOPAMIDOL (ISOVUE-370) INJECTION 76%
INTRAVENOUS | Status: AC
  Administered 2015-12-25: 50 mL
  Filled 2015-12-25: qty 50

## 2015-12-25 MED ORDER — SODIUM CHLORIDE 0.9 % IV BOLUS (SEPSIS)
500.0000 mL | Freq: Once | INTRAVENOUS | Status: AC
  Administered 2015-12-25: 500 mL via INTRAVENOUS

## 2015-12-25 MED ORDER — HALOPERIDOL LACTATE 2 MG/ML PO CONC
0.5000 mg | ORAL | Status: DC | PRN
Start: 2015-12-25 — End: 2015-12-26
  Filled 2015-12-25: qty 0.3

## 2015-12-25 MED ORDER — MORPHINE SULFATE (PF) 4 MG/ML IV SOLN
4.0000 mg | INTRAVENOUS | Status: DC | PRN
  Administered 2015-12-25 (×3): 4 mg via INTRAVENOUS
  Filled 2015-12-25 (×3): qty 1

## 2015-12-25 MED ORDER — GLYCOPYRROLATE 1 MG PO TABS: 1.0000 mg | ORAL_TABLET | ORAL | Status: DC | PRN

## 2015-12-25 MED ORDER — SODIUM CHLORIDE 0.9 % IV SOLN: 100.0000 mL/h | INTRAVENOUS | Status: DC

## 2015-12-25 MED ORDER — GLYCOPYRROLATE 0.2 MG/ML IJ SOLN: 0.2000 mg | INTRAMUSCULAR | Status: DC | PRN

## 2015-12-25 MED ORDER — ACETAMINOPHEN 325 MG PO TABS: 650.0000 mg | ORAL_TABLET | Freq: Four times a day (QID) | ORAL | Status: DC | PRN

## 2015-12-25 MED ORDER — LORAZEPAM 1 MG PO TABS: 1.0000 mg | ORAL_TABLET | ORAL | Status: DC | PRN

## 2015-12-25 MED ORDER — BIOTENE DRY MOUTH MT LIQD: 15.0000 mL | OROMUCOSAL | Status: DC | PRN

## 2015-12-25 MED ORDER — SODIUM CHLORIDE 0.9% FLUSH: 3.0000 mL | INTRAVENOUS | Status: DC | PRN

## 2015-12-25 MED ORDER — HALOPERIDOL LACTATE 5 MG/ML IJ SOLN: 0.5000 mg | INTRAMUSCULAR | Status: DC | PRN

## 2015-12-25 MED ORDER — MORPHINE SULFATE (PF) 2 MG/ML IV SOLN
1.0000 mg | INTRAVENOUS | Status: DC | PRN
Start: 2015-12-25 — End: 2015-12-25

## 2015-12-25 MED ORDER — LORAZEPAM 2 MG/ML PO CONC: 1.0000 mg | ORAL | Status: DC | PRN

## 2015-12-25 MED ORDER — POLYVINYL ALCOHOL 1.4 % OP SOLN
1.0000 [drp] | Freq: Four times a day (QID) | OPHTHALMIC | Status: DC | PRN
  Filled 2015-12-25: qty 15

## 2015-12-25 MED ORDER — SODIUM CHLORIDE 0.9% FLUSH: 3.0000 mL | Freq: Two times a day (BID) | INTRAVENOUS | Status: DC

## 2015-12-25 MED ORDER — SODIUM CHLORIDE 0.9 % IV SOLN
1.0000 mg/h | INTRAVENOUS | Status: DC
  Administered 2015-12-25: 2 mg/h via INTRAVENOUS
  Filled 2015-12-25: qty 10

## 2015-12-25 NOTE — Consult Note (Signed)
Requesting Physician: Dr. Vanita Panda    Chief Complaint: Stroke  History obtained from:  family  HPI:                                                                                                                                         Lori Henson is an 82 y.o. female resides at a nursing home. Patient was last seen normal yesterday but the exact time is unknown.'s morning she was found and was not her normal state that she was brought to the emergency department via EMS. Initial CT scan showed a dense left MCA sign. Follow-up CTA showed a cutoff at the M1 with minimal vascular perfusion distally. Patient currently is laying in the bed with a left gaze deviation mute follows no commands and is flaccid on the right side. In talking to the daughter, the patient's husband has recently died and the patient has been expressing her desire to also die.  Given the severity of the stroke, the daughter is requesting palliative care to be involved as she may not want to go further with any relation of care. At this point she is thinking her mother would like to just be comfort care.   Date last known well: Date: 12/24/2015 Time last known well: Unable to determine tPA Given: No: out of window  Past Medical History:  Diagnosis Date  . Angular cheilitis   . Anxiety   . Atrial flutter ([place])   . Bradycardia 2007   s/p pacemaker  . Dysrhythmia   . GERD (gastroesophageal reflux disease)   . Hypertension   . Lichen planus   . Malignant melanoma ([place]) 2001   right leg, s/p radiation, recurrence 2008  . Osteoporosis    fosamax, compression fx s/p kyphoplasty  . Presence of permanent cardiac pacemaker     Past Surgical History:  Procedure Laterality Date  . BACK SURGERY    . bladder support/spark sling  2007   s/p anterior repair with pubovaginal sling and cystoscopy  . cataract surgery  1998   x2  . CHOLECYSTECTOMY  2009  . EYE SURGERY Bilateral    Cataract Extraction  . FRACTURE SURGERY Left     fractured left wrist with plate inserted  . HEMORROIDECTOMY    . HERNIA REPAIR  1980  . INSERT / REPLACE / REMOVE PACEMAKER    . KYPHOPLASTY  2007   L-5  . PACEMAKER INSERTION N/A 03/07/2015   Procedure: / pacemaker change out;  Surgeon: Isaias Cowman, MD;  Location: [facility] ORS;  Service: Cardiovascular;  Laterality: N/A;  . PACEMAKER PLACEMENT  2007  . surgery for melanoma    . TONSILLECTOMY    . TONSILLECTOMY AND ADENOIDECTOMY  1933  . TUBAL LIGATION      Family History  Problem Relation Age of Onset  . Heart disease Mother   . Heart disease Father  died age 65  . Stroke Sister   . Kidney disease Brother   . Uterine cancer Maternal Aunt   . Ovarian cancer      cousin   Social History:  reports that she has never smoked. She has never used smokeless tobacco. She reports that she does not drink alcohol or use drugs.  Allergies:  Allergies  Allergen Reactions  . Macrobid [Nitrofurantoin Macrocrystal] Itching  . Nitrofurantoin Itching and Other (See Comments)  . Ranitidine Hcl Diarrhea  . Zantac [Ranitidine Hcl] Diarrhea    Medications:                                                                                                                           Current Facility-Administered Medications  Medication Dose Route Frequency Provider Last Rate Last Dose  . 0.9 %  sodium chloride infusion  100 mL/hr Intravenous Continuous Carmin Muskrat, MD      . morphine 4 MG/ML injection 4 mg  4 mg Intravenous Q1H PRN Carmin Muskrat, MD   4 mg at 12/09/2015 1121   Current Outpatient Prescriptions  Medication Sig Dispense Refill  . aspirin EC 81 MG tablet Take 81 mg by mouth daily.    . calcium carbonate (TUMS EX) 750 MG chewable tablet Chew 1 tablet by mouth 2 (two) times daily.    . carvedilol (COREG) 12.5 MG tablet Take 12.5 mg by mouth 2 (two) times daily with a meal.     . Casanthranol-Docusate Sodium 30-100 MG CAPS Take 1 capsule by mouth as needed.     .  Cholecalciferol ([employer] VITAMIN D3) 1000 UNITS capsule Take 1,000 Units by mouth daily.    . clonazePAM (KLONOPIN) 0.5 MG tablet Take 0.5 tablets (0.25 mg total) by mouth at bedtime. (Patient not taking: Reported on 12/07/2015) 15 tablet 1  . lactulose (CHRONULAC) 10 GM/15ML solution Take 45 mLs (30 g total) by mouth 2 (two) times daily. (Patient not taking: Reported on 12/16/2015) 240 mL 0  . methocarbamol (ROBAXIN) 500 MG tablet Take 500 mg by mouth 2 (two) times daily.    . metoprolol (LOPRESSOR) 50 MG tablet Take 1 tablet (50 mg total) by mouth 2 (two) times daily. (Patient not taking: Reported on 12/23/2015) 60 tablet 0  . mirtazapine (REMERON) 30 MG tablet Take 1 tablet (30 mg total) by mouth at bedtime. (Patient not taking: Reported on 01/02/2016) 30 tablet 1  . Multiple Vitamins-Minerals (ICAPS) CAPS Take by mouth 2 (two) times daily.     . sorbitol 70 % SOLN use if needed (Patient not taking: Reported on 12/20/2015) 1000 mL 3  . venlafaxine XR (EFFEXOR-XR) 75 MG 24 hr capsule Take 1 capsule (75 mg total) by mouth daily with breakfast. (Patient not taking: Reported on 01/01/2016) 30 capsule 1     ROS:  History obtained from unobtainable from patient due to mental status   Neurologic Examination:                                                                                                      Blood pressure (!) 191/151, pulse 106, temperature (!) 96.7 F (35.9 C), temperature source Rectal, resp. rate 21, SpO2 97 %.  HEENT-  Normocephalic, no lesions, without obvious abnormality.  Normal external eye and conjunctiva.  Normal TM's bilaterally.  Normal auditory canals and external ears. Normal external nose, mucus membranes and septum.  Normal pharynx. Cardiovascular- S1, S2 normal, pulses palpable throughout   Lungs- chest clear, no wheezing, rales, normal  symmetric air entry Abdomen- normal findings: bowel sounds normal Extremities- no edema Lymph-no adenopathy palpable Musculoskeletal-no joint tenderness, deformity or swelling Skin-warm and dry, no hyperpigmentation, vitiligo, or suspicious lesions  Neurological Examination Mental Status: Alert, also commands Cranial Nerves: II: Pupils equal round reactive to light, no blink to threat on the right. III,IV, VI: Forced left gaze deviation V,VII: Right facial droop  Motor: Flaccid right upper and lower extremity with full strength on left upper and lower extremity Sensory: No response on the right upper and lower extremity, sensation intact on the left Deep Tendon Reflexes: 2+ and symmetric throughout left upper and lower extremity, triple reflex with the right lower extremity Plantars: Right: downgoing   Left: Upgoing        Lab Results: Basic Metabolic Panel:  Recent Labs Lab 01/02/2016 0919  NA 134*  K 4.5  CL 101  CO2 26  GLUCOSE 116*  BUN 17  CREATININE 0.85  CALCIUM 8.9    Liver Function Tests:  Recent Labs Lab 12/13/2015 0919  AST 26  ALT 19  ALKPHOS 140*  BILITOT 0.8  PROT 6.6  ALBUMIN 3.3*   No results for input(s): LIPASE, AMYLASE in the last 168 hours. No results for input(s): AMMONIA in the last 168 hours.  CBC:  Recent Labs Lab 12/22/2015 0919  WBC 8.1  NEUTROABS 5.6  HGB 13.0  HCT 39.7  MCV 93.6  PLT 275    Cardiac Enzymes:  Recent Labs Lab 12/11/2015 0919  TROPONINI <0.03    Lipid Panel: No results for input(s): CHOL, TRIG, HDL, CHOLHDL, VLDL, LDLCALC in the last 168 hours.  CBG:  Recent Labs Lab 12/25/15 0915  GLUCAP 113*    Microbiology: Results for orders placed or performed during the hospital encounter of 11/04/15  Urine culture     Status: Abnormal   Collection Time: 11/04/15  5:37 PM  Result Value Ref Range Status   Specimen Description URINE, RANDOM  Final   Special Requests NONE  Final   Culture >=100,000  COLONIES/mL ESCHERICHIA COLI (A)  Final   Report Status 11/08/2015 FINAL  Final   Organism ID, Bacteria ESCHERICHIA COLI (A)  Final      Susceptibility   Escherichia coli - MIC*    AMPICILLIN <=2 SENSITIVE Sensitive     CEFAZOLIN <=4 SENSITIVE Sensitive     CEFTRIAXONE <=1 SENSITIVE Sensitive     CIPROFLOXACIN <=  0.25 SENSITIVE Sensitive     GENTAMICIN 4 SENSITIVE Sensitive     IMIPENEM <=0.25 SENSITIVE Sensitive     NITROFURANTOIN <=16 SENSITIVE Sensitive     TRIMETH/SULFA <=20 SENSITIVE Sensitive     AMPICILLIN/SULBACTAM <=2 SENSITIVE Sensitive     Extended ESBL NEGATIVE Sensitive     * >=100,000 COLONIES/mL ESCHERICHIA COLI  MRSA PCR Screening     Status: None   Collection Time: 11/04/15  8:15 PM  Result Value Ref Range Status   MRSA by PCR NEGATIVE NEGATIVE Final    Comment:        The GeneXpert MRSA Assay (FDA approved for NASAL specimens only), is one component of a comprehensive MRSA colonization surveillance program. It is not intended to diagnose MRSA infection nor to guide or monitor treatment for MRSA infections.     Coagulation Studies:  Recent Labs  12/12/2015 0919  LABPROT 14.1  INR 1.09    Imaging: Ct Head Wo Contrast  Result Date: 12/18/2015 CLINICAL DATA:  Left-sided gaze. EXAM: CT HEAD WITHOUT CONTRAST TECHNIQUE: Contiguous axial images were obtained from the base of the skull through the vertex without intravenous contrast. COMPARISON:  05/27/2007 FINDINGS: Brain: No evidence of acute infarction, hemorrhage, extra-axial collection, ventriculomegaly, or mass effect. Generalized cerebral atrophy. Periventricular white matter low attenuation likely secondary to microangiopathy. Vascular: Cerebrovascular atherosclerotic calcifications are noted. Relative hyperdense left M2 segment of the MCA which is more dense compared with the prior CT of 05/27/2007 and may reflect thrombus. Skull: Negative for fracture or focal lesion. Sinuses/Orbits: There is chronic  complete opacification of the right sphenoid sinus with cortical thickening consistent with longstanding chronic sinusitis. Visualized orbits are unremarkable. Other: None. IMPRESSION: 1. Relative hyperdense left M2 segment of the MCA which is more dense compared with the prior CT of 05/27/2007 and may reflect thrombus. If there is further clinical concern, recommend evaluation with a CTA of the brain. 2. Otherwise no acute intracranial pathology. Electronically Signed   By: Kathreen Devoid   On: 12/24/2015 09:47       Assessment and plan discussed with with attending physician and they are in agreement.    Etta Quill PA-C [facility] [phone]  12/14/2015, 11:17 AM   Assessment: 82 y.o. female 's ending to the hospital with left gaze deviation right facial droop, right upper and lower extremity flaccidity. CT of brain shows a left MCA sign. Follow-up CTA of head shows a left M1 cut off. Discussion has been made with daughter who has had discussions with patient and at this time feels that she would like a palliative care consult for likely goals for comfort care.  Stroke Risk Factors - hypertension   At this time will not further any studies for stroke. Further neuro evaluation will depend on goals of care.

## 2015-12-25 NOTE — H&P (Signed)
History and Physical    LACHON MARINKO [id number] DOB: [birth year] DOA: 12/30/2015   PCP: Einar Pheasant, MD   Patient coming from/Resides with: Independent living facility  Admission status: Inpatient/palliative floor-patient presents with significant left MCA embolic stroke with irreversible neurological findings-desire expressed at time of admission by daughter based on patient's previous wishes to be comfort focus/palliative measures only.   Chief Complaint: Acute stroke  HPI: Lori Henson is a 82 y.o. female with medical history significant for atrial fibrillation with associated tachybradycardia syndrome status post pacemaker, recent issues with spontaneous thoracic compression fracture and ongoing pain, depression and anxiety, hypertension and prior gastritis, chronic recurrent hyponatremia, alopecia who was sent to the ER after developing strokelike symptoms around 8:00 this morning. Daughter is at bedside providing history since patient is unable to based on severity of stroke symptoms. The daughter reports that she has had assistance from a home health nurse for the past few weeks since patient's previous hospitalization. Upon checking the patient this morning they found her on her back flaccid right side and unable to talk. She also had predominant right-sided gaze. She was transported to the ER by EMS and during transport patient had 2 runs of nonsustained ventricular tachycardia.  Evaluation in the ER revealed clinically dense stroke with CTA revealing a right MCA region embolic stroke. Neurology has evaluated the patient and given the severity of the stroke she requested palliative care/comfort measures based on patient's previously expressed wishes/desires. Of note the patient's husband has recently died. Patient has been very depressed since her husband died. She is also had some issues with difficult to control pain in setting of acute T9-T10 compression fractures.  ED Course:    Vital Signs: BP (!) 186/150   Pulse (!) 48   Temp (!) 96.7 F (35.9 C) (Rectal)   Resp 12   SpO2 96%   Review of Systems:  ** unable to obtain from patient given severity of stroke; primary issue daughter discusses is patient's difficult to manage pain in relation to her thoracic compression fractures   Past Medical History:  Diagnosis Date  . Angular cheilitis   . Anxiety   . Atrial flutter ([place])   . Bradycardia 2007   s/p pacemaker  . Dysrhythmia   . GERD (gastroesophageal reflux disease)   . Hypertension   . Lichen planus   . Malignant melanoma ([place]) 2001   right leg, s/p radiation, recurrence 2008  . Osteoporosis    fosamax, compression fx s/p kyphoplasty  . Presence of permanent cardiac pacemaker     Past Surgical History:  Procedure Laterality Date  . BACK SURGERY    . bladder support/spark sling  2007   s/p anterior repair with pubovaginal sling and cystoscopy  . cataract surgery  1998   x2  . CHOLECYSTECTOMY  2009  . EYE SURGERY Bilateral    Cataract Extraction  . FRACTURE SURGERY Left    fractured left wrist with plate inserted  . HEMORROIDECTOMY    . HERNIA REPAIR  1980  . INSERT / REPLACE / REMOVE PACEMAKER    . KYPHOPLASTY  2007   L-5  . PACEMAKER INSERTION N/A 03/07/2015   Procedure: / pacemaker change out;  Surgeon: Isaias Cowman, MD;  Location: [facility] ORS;  Service: Cardiovascular;  Laterality: N/A;  . PACEMAKER PLACEMENT  2007  . surgery for melanoma    . TONSILLECTOMY    . TONSILLECTOMY AND ADENOIDECTOMY  1933  . TUBAL LIGATION      Social  History   Social History  . Marital status: Widowed    Spouse name: N/A  . Number of children: 4  . Years of education: N/A   Occupational History  . Not on file.   Social History Main Topics  . Smoking status: Never Smoker  . Smokeless tobacco: Never Used  . Alcohol use No  . Drug use: No  . Sexual activity: No   Other Topics Concern  . Not on file   Social History Narrative  . 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 Macrobid [Nitrofurantoin Macrocrystal] Itching  . Nitrofurantoin Itching and Other (See Comments)  . Ranitidine Hcl Diarrhea  . Zantac [Ranitidine Hcl] Diarrhea    Family History  Problem Relation Age of Onset  . Heart disease Mother   . Heart disease Father     died age 5  . Stroke Sister   . Kidney disease Brother   . Uterine cancer Maternal Aunt   . Ovarian cancer      cousin     Prior to Admission medications   Medication Sig Start Date End Date Taking? Authorizing Provider  aspirin EC 81 MG tablet Take 81 mg by mouth daily.    Historical Provider, MD  calcium carbonate (TUMS EX) 750 MG chewable tablet Chew 1 tablet by mouth 2 (two) times daily.    Historical Provider, MD  carvedilol (COREG) 12.5 MG tablet Take 12.5 mg by mouth 2 (two) times daily with a meal.  11/30/15   Historical Provider, MD  Casanthranol-Docusate Sodium 30-100 MG CAPS Take 1 capsule by mouth as needed.     Historical Provider, MD  Cholecalciferol ([employer] VITAMIN D3) 1000 UNITS capsule Take 1,000 Units by mouth daily.    Historical Provider, MD  clonazePAM (KLONOPIN) 0.5 MG tablet Take 0.5 tablets (0.25 mg total) by mouth at bedtime. Patient not taking: Reported on 12/10/2015 12/18/15   Rainey Pines, MD  lactulose (CHRONULAC) 10 GM/15ML solution Take 45 mLs (30 g total) by mouth 2 (two) times daily. Patient not taking: Reported on 12/31/2015 11/09/15   Bettey Costa, MD  methocarbamol (ROBAXIN) 500 MG tablet Take 500 mg by mouth 2 (two) times daily.    Historical Provider, MD  metoprolol (LOPRESSOR) 50 MG tablet Take 1 tablet (50 mg total) by mouth 2 (two) times daily. Patient not taking: Reported on 12/18/2015 11/09/15   Bettey Costa, MD  mirtazapine (REMERON) 30 MG tablet Take 1 tablet (30 mg total) by mouth at bedtime. Patient not taking: Reported on 12/15/2015 12/18/15    Rainey Pines, MD  Multiple Vitamins-Minerals (ICAPS) CAPS Take by mouth 2 (two) times daily.     Historical Provider, MD  sorbitol 70 % SOLN use if needed Patient not taking: Reported on 12/09/2015 07/11/15   Einar Pheasant, MD  venlafaxine XR (EFFEXOR-XR) 75 MG 24 hr capsule Take 1 capsule (75 mg total) by mouth daily with breakfast. Patient not taking: Reported on 01/01/2016 12/18/15   Rainey Pines, MD    Physical Exam: Vitals:   12/13/2015 0930 12/31/2015 1000 12/22/2015 1130 12/07/2015 1215  BP: (!) 195/108 (!) 191/151 (!) 171/149 (!) 186/150  Pulse: 107 106 109 (!) 48  Resp: 15 21 14 12   Temp:      TempSrc:      SpO2: 91% 97% 96% 96%      Constitutional: Mildly restless and nonverbal Eyes: PERRL-pupils pinpoint, lids  and conjunctivae normal ENMT: Mucous membranes are moist. Posterior pharynx clear of any exudate or lesions. Neck: normal, supple, no masses, no thyromegaly Respiratory: clear to auscultation bilaterally, no wheezing, no crackles. Normal respiratory effort. No accessory muscle use. Nasal cannula oxygen Cardiovascular: Regular rate and rhythm, no murmurs / rubs / gallops. No extremity edema. 2+ pedal pulses. No carotid bruits.  Abdomen: no tenderness, no masses palpated. No hepatosplenomegaly. Bowel sounds positive.  Musculoskeletal: no clubbing / cyanosis. No joint deformity upper and lower extremities. Good ROM, no contractures. Normal muscle tone.  Skin: no rashes, lesions, ulcers. No induration Neurologic: Flaccid right side, nonverbal, slight right gaze deviation, not following commands-alert at times Psychiatric: Unable to assess given patient's aphasia   Labs on Admission: I have personally reviewed following labs and imaging studies  CBC:  Recent Labs Lab 12/11/2015 0919  WBC 8.1  NEUTROABS 5.6  HGB 13.0  HCT 39.7  MCV 93.6  PLT 123XX123   Basic Metabolic Panel:  Recent Labs Lab 12/27/2015 0919  NA 134*  K 4.5  CL 101  CO2 26  GLUCOSE 116*  BUN 17    CREATININE 0.85  CALCIUM 8.9   GFR: Estimated Creatinine Clearance: 38.7 mL/min (by C-G formula based on SCr of 0.85 mg/dL). Liver Function Tests:  Recent Labs Lab 12/21/2015 0919  AST 26  ALT 19  ALKPHOS 140*  BILITOT 0.8  PROT 6.6  ALBUMIN 3.3*   No results for input(s): LIPASE, AMYLASE in the last 168 hours. No results for input(s): AMMONIA in the last 168 hours. Coagulation Profile:  Recent Labs Lab 12/16/2015 0919  INR 1.09   Cardiac Enzymes:  Recent Labs Lab 12/30/2015 0919  TROPONINI <0.03   BNP (last 3 results) No results for input(s): PROBNP in the last 8760 hours. HbA1C: No results for input(s): HGBA1C in the last 72 hours. CBG:  Recent Labs Lab 12/22/2015 0915  GLUCAP 113*   Lipid Profile: No results for input(s): CHOL, HDL, LDLCALC, TRIG, CHOLHDL, LDLDIRECT in the last 72 hours. Thyroid Function Tests: No results for input(s): TSH, T4TOTAL, FREET4, T3FREE, THYROIDAB in the last 72 hours. Anemia Panel: No results for input(s): VITAMINB12, FOLATE, FERRITIN, TIBC, IRON, RETICCTPCT in the last 72 hours. Urine analysis:    Component Value Date/Time   COLORURINE YELLOW 12/20/2015 0958   APPEARANCEUR CLEAR 12/11/2015 0958   APPEARANCEUR Hazy 02/02/2014 1608   LABSPEC 1.011 01/05/2016 0958   LABSPEC 1.012 02/02/2014 1608   PHURINE 7.5 12/08/2015 0958   GLUCOSEU NEGATIVE 01/01/2016 0958   GLUCOSEU Negative 02/02/2014 1608   HGBUR NEGATIVE 12/15/2015 0958   BILIRUBINUR NEGATIVE 12/20/2015 0958   BILIRUBINUR neg 09/19/2014 1117   BILIRUBINUR Negative 02/02/2014 1608   KETONESUR NEGATIVE 12/10/2015 0958   PROTEINUR NEGATIVE 12/15/2015 0958   UROBILINOGEN 0.2 09/19/2014 1117   NITRITE NEGATIVE 01/04/2016 0958   LEUKOCYTESUR NEGATIVE 12/17/2015 0958   LEUKOCYTESUR 3+ 02/02/2014 1608   Sepsis Labs: @LABRCNTIP (procalcitonin:4,lacticidven:4) )No results found for this or any previous visit (from the past 240 hour(s)).   Radiological Exams on  Admission: Ct Angio Head W Or Wo Contrast  Result Date: 12/25/2015 CLINICAL DATA:  Left-sided gaze.  Rule out stroke. EXAM: CT ANGIOGRAPHY HEAD TECHNIQUE: Multidetector CT imaging of the head was performed using the standard protocol during bolus administration of intravenous contrast. Multiplanar CT image reconstructions and MIPs were obtained to evaluate the vascular anatomy. CONTRAST:  50 cc Isovue 370 intravenous COMPARISON:  Head CT from earlier today. FINDINGS: CTA HEAD Anterior circulation: Symmetric carotid  arteries with tortuous cervical and cavernous segments. Small if any communicating arteries. Distal left M1 occlusion, beyond the anterior temporal branch. This has an acute appearance of embolus with meniscus. No other embolus is noted. Posterior circulation: Mild right vertebral artery dominance. Standard vertebrobasilar branching. No branch occlusion or flow limiting stenosis. Venous sinuses: Patent as permitted by contrast timing. Anatomic variants: None significant Delayed phase:No parenchymal enhancement. Infarct is now seen in the left insular cortex. Chronic small-vessel ischemic changes in cerebral white matter. These results were called by telephone at the time of interpretation on 12/11/2015 at 11:23 am to Dr. Carmin Muskrat , who verbally acknowledged these results. These results were called by telephone at the time of interpretation on 01/05/2016 at 11:26 am to [place] who verbally acknowledged these results. IMPRESSION: 1. Acute distal left M1 occlusion. Nonhemorrhagic infarct is now visible in the left insula. 2. Elsewhere negative CTA with no notable atheromatous changes. Electronically Signed   By: Monte Fantasia M.D.   On: 12/10/2015 11:30   Ct Head Wo Contrast  Result Date: 01/05/2016 CLINICAL DATA:  Left-sided gaze. EXAM: CT HEAD WITHOUT CONTRAST TECHNIQUE: Contiguous axial images were obtained from the base of the skull through the vertex without intravenous contrast.  COMPARISON:  05/27/2007 FINDINGS: Brain: No evidence of acute infarction, hemorrhage, extra-axial collection, ventriculomegaly, or mass effect. Generalized cerebral atrophy. Periventricular white matter low attenuation likely secondary to microangiopathy. Vascular: Cerebrovascular atherosclerotic calcifications are noted. Relative hyperdense left M2 segment of the MCA which is more dense compared with the prior CT of 05/27/2007 and may reflect thrombus. Skull: Negative for fracture or focal lesion. Sinuses/Orbits: There is chronic complete opacification of the right sphenoid sinus with cortical thickening consistent with longstanding chronic sinusitis. Visualized orbits are unremarkable. Other: None. IMPRESSION: 1. Relative hyperdense left M2 segment of the MCA which is more dense compared with the prior CT of 05/27/2007 and may reflect thrombus. If there is further clinical concern, recommend evaluation with a CTA of the brain. 2. Otherwise no acute intracranial pathology. Electronically Signed   By: Kathreen Devoid   On: 12/23/2015 09:47    EKG: (Independently reviewed) atrial fibrillation with new T-wave inversion in lead 1, significant ST segment depression in lead V3, new ST segment downsloping depression in leads V4 through V6  Assessment/Plan Principal Problem:   Acute ischemic left MCA stroke/End of life care -Patient presents with significant left dense stroke likely embolic in nature based on CTA with previously expressed desire to not be recessive dictated nor explore extraordinary life support means-this has been confirmed by daughter with focus now on palliative and comfort measures -Formal palliative evaluation pending -End-of-life order set initiated -Given known recent issues with thoracic compression fractures I have ordered a continuous morphine infusion at 1 mg per hour with orders to titrate up to a max dose of 10 mg per hour -Anticipate hospital death -Air mattress -Foley catheter if  frequently incontinent of urine (patient comfort only)  Active Problems:   Artificial cardiac pacemaker -Orders in place to turn off pacemaker -EKG suggestive of associated cardiac ischemic event         DVT prophylaxis: None/comfort measures Code Status: DO NOT RESUSCITATE  Family Communication: Daughter Vivi Martens at bedside (she is from [place]) [phone]  Disposition Plan: Anticipate hospital death Consults called: Palliative medicine    Regan Mcbryar L. ANP-BC [facility] Pager [phone]   If 7PM-7AM, please contact night-coverage [email or web address] Password [facility]  12/28/2015, 3:54 PM

## 2015-12-25 NOTE — Progress Notes (Signed)
Patient admited to room 5M22 at 1715. Easily arousable. Morphine drip at 69ml/hr. Transferred to [employer]. Keeping comfortable at this time.

## 2015-12-25 NOTE — ED Triage Notes (Signed)
Pt here from assisted  Living with [facility] , pt was lsn at 11pm last night , cbg 149 , pt  Has a gauze to the right and had 2 runs of v tach according to ems

## 2015-12-25 NOTE — ED Provider Notes (Signed)
[facility] DEPT Provider Note   CSN: [id number] Arrival date & time: 12/24/2015  0910     History   Chief Complaint Chief Complaint  Patient presents with  . Altered Mental Status    HPI Lori Henson is a 82 y.o. female.  HPI Patient presents via EMS providers after being found minimally responsive. Last seen normal time was at least 10 hours ago. EMS reports that the patient was found by her daughter this morning acting in an unusual manner. Per report the patient was in her usual state of health until this. Per report the patient lives in an assisted living apartment, by herself. Patient is reportedly awake, alert, appropriately interactive at baseline.  On my initial exam the patient does not follow commands, has her eyes open, but does not track visually, level V caveat secondary to altered mental status. Norville Haggard reports that in which the patient had episodes of V. tach, nonsustained.  Past Medical History:  Diagnosis Date  . Angular cheilitis   . Anxiety   . Atrial flutter ([place])   . Bradycardia 2007   s/p pacemaker  . Dysrhythmia   . GERD (gastroesophageal reflux disease)   . Hypertension   . Lichen planus   . Malignant melanoma ([place]) 2001   right leg, s/p radiation, recurrence 2008  . Osteoporosis    fosamax, compression fx s/p kyphoplasty  . Presence of permanent cardiac pacemaker     Patient Active Problem List   Diagnosis Date Noted  . Rectal bleeding 09/18/2015  . Fall 01/13/2015  . Cough 10/15/2014  . AV junctional bradycardia 07/07/2014  . Gastritis 06/12/2014  . Hair loss 06/12/2014  . Gastric catarrh 06/12/2014  . 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 Lumbar and sacral osteoarthritis 05/10/2014  . Dysphagia 04/09/2014  . Can't get food down 03/24/2014  . Abdominal pain 02/20/2014  . Wound of ankle 02/20/2014  . Open wound of knee, leg (except thigh), and ankle 02/20/2014  . Hair thinning 11/11/2013  .  Unsteady gait 11/11/2013  . Back pain 11/11/2013  . Abnormal gait 11/11/2013  . Alopecia 11/11/2013  . Atrial flutter ([place]) 09/05/2013  . Chronic pain 09/05/2013  . Artificial cardiac pacemaker 09/05/2013  . Edema of foot 09/05/2013  . Prolapse of urethra 09/05/2013  . Squamous cell skin cancer 08/23/2013  . Cancer of skin, squamous cell 08/23/2013  . Restless leg syndrome 02/18/2013  . Restless leg 02/18/2013  . Failure to thrive 01/31/2013  . Neoplasm of uncertain behavior [id number]  . Depression 12/05/2012  . Depression, major, single episode 12/05/2012  . Major depressive disorder with single episode ([place]) 12/05/2012  . Hyponatremia 06/07/2012  . Hypo-osmolality and hyponatremia 06/07/2012  . Lower extremity edema 05/11/2012  . Accumulation of fluid in tissues 05/11/2012  . Edema 05/11/2012  . Hypertension 02/07/2012  . Malignant melanoma ([place]) 02/07/2012  . Essential (primary) hypertension 02/07/2012  . Cutaneous malignant melanoma ([place]) 02/07/2012  . Malignant melanoma of skin ([place]) 02/07/2012  . Chronic constipation 02/06/2012  . CN (constipation) 02/06/2012    Past Surgical History:  Procedure Laterality Date  . BACK SURGERY    . bladder support/spark sling  2007   s/p anterior repair with pubovaginal sling and cystoscopy  . cataract surgery  1998   x2  . CHOLECYSTECTOMY  2009  . EYE SURGERY Bilateral    Cataract Extraction  . FRACTURE SURGERY Left    fractured left wrist with plate inserted  . HEMORROIDECTOMY    .  HERNIA REPAIR  1980  . INSERT / REPLACE / REMOVE PACEMAKER    . KYPHOPLASTY  2007   L-5  . PACEMAKER INSERTION N/A 03/07/2015   Procedure: / pacemaker change out;  Surgeon: Isaias Cowman, MD;  Location: [facility] ORS;  Service: Cardiovascular;  Laterality: N/A;  . PACEMAKER PLACEMENT  2007  . surgery for melanoma    . TONSILLECTOMY    . TONSILLECTOMY AND ADENOIDECTOMY  1933  . TUBAL LIGATION      OB History    No data available        Home Medications    Prior to Admission medications   Medication Sig Start Date End Date Taking? Authorizing Provider  aspirin EC 81 MG tablet Take 81 mg by mouth daily.    Historical Provider, MD  calcium carbonate (TUMS EX) 750 MG chewable tablet Chew 1 tablet by mouth 2 (two) times daily.    Historical Provider, MD  carvedilol (COREG) 12.5 MG tablet  11/30/15   Historical Provider, MD  Casanthranol-Docusate Sodium 30-100 MG CAPS Take 1 capsule by mouth as needed.     Historical Provider, MD  Cholecalciferol ([employer] VITAMIN D3) 1000 UNITS capsule Take 1,000 Units by mouth daily.    Historical Provider, MD  clonazePAM (KLONOPIN) 0.5 MG tablet Take 0.5 tablets (0.25 mg total) by mouth at bedtime. 12/18/15   Rainey Pines, MD  lactulose (CHRONULAC) 10 GM/15ML solution Take 45 mLs (30 g total) by mouth 2 (two) times daily. 11/09/15   Bettey Costa, MD  metoprolol (LOPRESSOR) 50 MG tablet Take 1 tablet (50 mg total) by mouth 2 (two) times daily. 11/09/15   Bettey Costa, MD  mirtazapine (REMERON) 30 MG tablet Take 1 tablet (30 mg total) by mouth at bedtime. 12/18/15   Rainey Pines, MD  Multiple Vitamins-Minerals (ICAPS) CAPS Take by mouth 2 (two) times daily.     Historical Provider, MD  sorbitol 70 % SOLN use if needed 07/11/15   Einar Pheasant, MD  venlafaxine XR (EFFEXOR-XR) 75 MG 24 hr capsule Take 1 capsule (75 mg total) by mouth daily with breakfast. 12/18/15   Rainey Pines, MD    Family History Family History  Problem Relation Age of Onset  . Heart disease Mother   . Heart disease Father     died age 30  . Stroke Sister   . Kidney disease Brother   . Uterine cancer Maternal Aunt   . Ovarian cancer      cousin    Social History Social History  Substance Use Topics  . Smoking status: Never Smoker  . Smokeless tobacco: Never Used  . Alcohol use No     Allergies   Macrobid [nitrofurantoin macrocrystal]; Nitrofurantoin; Ranitidine hcl; and Zantac [ranitidine hcl]   Review of  Systems Review of Systems  Unable to perform ROS: Mental status change     Physical Exam Updated Vital Signs BP (!) 195/108   Pulse 107   Temp (!) 96.7 F (35.9 C) (Rectal)   Resp 15   SpO2 91%   Physical Exam  Constitutional: She appears listless. No distress.  Ill-appearing elderly female with right-sided hemi-neglect not interacting  HENT:  Head: Normocephalic and atraumatic.  Eyes: Conjunctivae and EOM are normal.  Cardiovascular: Normal rate and regular rhythm.   Pulmonary/Chest: Effort normal and breath sounds normal. No stridor. No respiratory distress.  Abdominal: She exhibits no distension.  Musculoskeletal: She exhibits no edema.  Neurological: She appears listless. A cranial nerve deficit is present.  Patient does  not follow commands, does not speak spontaneously, does not track visually, does not cross midline with focused exam visually, does not move her right side spontaneously, moves the left side minimally.  GCS 6   Skin: Skin is warm and dry.  Psychiatric: Cognition and memory are impaired.  Nursing note and vitals reviewed.    ED Treatments / Results  Labs (all labs ordered are listed, but only abnormal results are displayed) Labs Reviewed  COMPREHENSIVE METABOLIC PANEL - Abnormal; Notable for the following:       Result Value   Sodium 134 (*)    Glucose, Bld 116 (*)    Albumin 3.3 (*)    Alkaline Phosphatase 140 (*)    GFR calc non Af Amer 57 (*)    All other components within normal limits  CBC WITH DIFFERENTIAL/PLATELET - Abnormal; Notable for the following:    RDW 16.0 (*)    Monocytes Absolute 1.1 (*)    All other components within normal limits  CBG MONITORING, ED - Abnormal; Notable for the following:    Glucose-Capillary 113 (*)    All other components within normal limits  APTT  ETHANOL  TROPONIN I  PROTIME-INR  URINALYSIS, ROUTINE W REFLEX MICROSCOPIC (NOT AT [facility])  URINE RAPID DRUG SCREEN, HOSP PERFORMED  Labs obtained during  downtime, reviewed on paper.  EKG  EKG Interpretation  Date/Time:  Tuesday December 25 2015 09:13:37 EDT Ventricular Rate:  119 PR Interval:    QRS Duration: 81 QT Interval:  306 QTC Calculation: 431 R Axis:   64 Text Interpretation:  Sinus tachycardia with irregular rate Anteroseptal infarct, age indeterminate Repol abnrm, severe global ischemia (LM/MVD) Abnormal ekg Confirmed by Carmin Muskrat  MD [phone]) on 12/07/2015 9:33:54 AM       Radiology Ct Angio Head W Or Wo Contrast  Result Date: 12/16/2015 CLINICAL DATA:  Left-sided gaze.  Rule out stroke. EXAM: CT ANGIOGRAPHY HEAD TECHNIQUE: Multidetector CT imaging of the head was performed using the standard protocol during bolus administration of intravenous contrast. Multiplanar CT image reconstructions and MIPs were obtained to evaluate the vascular anatomy. CONTRAST:  50 cc Isovue 370 intravenous COMPARISON:  Head CT from earlier today. FINDINGS: CTA HEAD Anterior circulation: Symmetric carotid arteries with tortuous cervical and cavernous segments. Small if any communicating arteries. Distal left M1 occlusion, beyond the anterior temporal branch. This has an acute appearance of embolus with meniscus. No other embolus is noted. Posterior circulation: Mild right vertebral artery dominance. Standard vertebrobasilar branching. No branch occlusion or flow limiting stenosis. Venous sinuses: Patent as permitted by contrast timing. Anatomic variants: None significant Delayed phase:No parenchymal enhancement. Infarct is now seen in the left insular cortex. Chronic small-vessel ischemic changes in cerebral white matter. These results were called by telephone at the time of interpretation on 12/20/2015 at 11:23 am to Dr. Carmin Muskrat , who verbally acknowledged these results. These results were called by telephone at the time of interpretation on 12/22/2015 at 11:26 am to [place] who verbally acknowledged these results. IMPRESSION: 1. Acute distal  left M1 occlusion. Nonhemorrhagic infarct is now visible in the left insula. 2. Elsewhere negative CTA with no notable atheromatous changes. Electronically Signed   By: Monte Fantasia M.D.   On: 12/15/2015 11:30   Ct Head Wo Contrast  Result Date: 12/25/2015 CLINICAL DATA:  Left-sided gaze. EXAM: CT HEAD WITHOUT CONTRAST TECHNIQUE: Contiguous axial images were obtained from the base of the skull through the vertex without intravenous contrast. COMPARISON:  05/27/2007 FINDINGS:  Brain: No evidence of acute infarction, hemorrhage, extra-axial collection, ventriculomegaly, or mass effect. Generalized cerebral atrophy. Periventricular white matter low attenuation likely secondary to microangiopathy. Vascular: Cerebrovascular atherosclerotic calcifications are noted. Relative hyperdense left M2 segment of the MCA which is more dense compared with the prior CT of 05/27/2007 and may reflect thrombus. Skull: Negative for fracture or focal lesion. Sinuses/Orbits: There is chronic complete opacification of the right sphenoid sinus with cortical thickening consistent with longstanding chronic sinusitis. Visualized orbits are unremarkable. Other: None. IMPRESSION: 1. Relative hyperdense left M2 segment of the MCA which is more dense compared with the prior CT of 05/27/2007 and may reflect thrombus. If there is further clinical concern, recommend evaluation with a CTA of the brain. 2. Otherwise no acute intracranial pathology. Electronically Signed   By: Kathreen Devoid   On: 12/09/2015 09:47    Procedures Procedures (including critical care time)  Medications Ordered in ED Medications  sodium chloride 0.9 % bolus 500 mL (500 mLs Intravenous New Bag/Given 12/16/2015 0941)    Followed by  0.9 %  sodium chloride infusion (not administered)     Initial Impression / Assessment and Plan / ED Course  I have reviewed the triage vital signs and the nursing notes.  Pertinent labs & imaging results that were available  during my care of the patient were reviewed by me and considered in my medical decision making (see chart for details).  Clinical Course    On repeat exam the patient brings in similar appearance, some discomfort, and the daughter now states that the patient has history of extensive back pathology, including multiple compression fractures. We reviewed the patient's evaluation thus far, including evidence for stroke. I also discussed patient's case with our neurology colleagues, who will assist with evaluation.  Neurology states that the patient has substantial deficits, not amenable to emergent intervention, and given the patient's previously acknowledged DO NOT RESUSCITATE status, palliative therapy is appropriate.  On repeat exam the patient remains in similar condition. I discussed the patient's CT, CT angiography with our radiologist, again demonstrating acute infarct.   Final Clinical Impressions(s) / ED Diagnoses  Elderly female presents with acute change in mental status compared to yesterday. Here the patient is awake, but not following commands reliably, has obvious neurologic deficits, including hemi-neglect, inability to follow commands. Given his daughters endorsement of the patient typically having appropriate interactivity, strength, no sustained deficits, there is immediate suspicion for stroke, this was demonstrated on CT, CT angiography. Patient's case discussed with radiology, neurology, and given the acute deficits, palliative care to assist with care measures, during hospitalization. Patient required admission for further evaluation, given her new neurologic deficiencies, inability to speak, walk, eat.  CRITICAL CARE Performed by: Carmin Muskrat Total critical care time: 40 minutes Critical care time was exclusive of separately billable procedures and treating other patients. Critical care was necessary to treat or prevent imminent or life-threatening  deterioration. Critical care was time spent personally by me on the following activities: development of treatment plan with patient and/or surrogate as well as nursing, discussions with consultants, evaluation of patient's response to treatment, examination of patient, obtaining history from patient or surrogate, ordering and performing treatments and interventions, ordering and review of laboratory studies, ordering and review of radiographic studies, pulse oximetry and re-evaluation of patient's condition.    Carmin Muskrat, MD 12/07/2015 [phone]

## 2015-12-25 NOTE — ED Notes (Signed)
Pt's daughter at bedside talking with Dr. Vanita Panda.

## 2015-12-25 NOTE — ED Notes (Signed)
Pt is resting comfortable on her left side, as family reports this is her desired side for comfort. She has compression fractures in her back that make side lying more comfortable. Pt's right side is flaccid and has left eye deviation. Pt is DNR, comfort care. Is on morphine drip. Pt's daughter at bedside, has 2 other son's who live out of town. Her daughter is from [place] and is in town visiting. Pt's husband recently passed away and she was living at an assisted living in [place]. 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

## 2015-12-25 NOTE — ED Notes (Signed)
Dr. Vanita Panda made aware of patient's BP 190/150. No intervention at this time. Awaiting CT.

## 2015-12-26 DIAGNOSIS — Z515 Encounter for palliative care: Secondary | ICD-10-CM

## 2015-12-26 DIAGNOSIS — I63512 Cerebral infarction due to unspecified occlusion or stenosis of left middle cerebral artery: Secondary | ICD-10-CM

## 2015-12-26 DIAGNOSIS — Z7189 Other specified counseling: Secondary | ICD-10-CM

## 2016-01-06 NOTE — Progress Notes (Signed)
Writer notified that patient passed, [place] donor service called, post mortem checklist filled out, MD notified for the death certificate and bed control called.

## 2016-01-06 NOTE — Progress Notes (Signed)
Daughter at bedside notified this nurse that pt wasn't breathing. Pt assessed, no pulse. Camera operator and Md notified. Daughter provided information for funeral home. Pt transferred with death certificate signed by Md.

## 2016-01-06 NOTE — Discharge Summary (Signed)
Death Summary  Lori Henson [id number] DOB: February 11, 1920 DOA: 2015/12/30  PCP: Einar Pheasant, MD  Admit date: 12/30/2015 Date of Death: 2015/12/31 at 10:25AM  Final Diagnoses:  Principal Problem:   Acute ischemic left MCA stroke [facility]) Active Problems:   Artificial cardiac pacemaker   Stroke ([place])   End of life care   Cerebral infarction due to occlusion of left middle cerebral artery [facility])   Hypertensive emergency   Symptomatic bradycardia   Palliative care encounter   Encounter for hospice care discussion   History of present illness: Lori Henson is a 82 y.o. female with a Past Medical History of anxiety, atrial flutter, symptomatically bradycardia status post pacemaker placement, GERD, HTN, malignant melanoma status post excision and radiation with recurrence in 2008, osteoporosis who presents with massive ischemic stroke.   Hospital Course:  Acute stroke  Patient was hospitalized. Patient was seen by neurology. Multiple discussions had between family, patient and ED care team and the decision was made to make her DO NOT RESUSCITATE and pursue comfort measures. Morphine drip started. Patient was seen by palliative medicine team. Patient subsequently passed away on Dec 31, 2015 at 10:25 AM.  Patient had a pacemaker, which was turned off.   Her other medical issues include atrial flutter, GERD, hypertension, and history of malignant melanoma.  Cause of death: Acute stroke   The results of significant diagnostics from this hospitalization (including imaging, microbiology, ancillary and laboratory) are listed below for reference.    Significant Diagnostic Studies: Ct Angio Head W Or Wo Contrast  Result Date: Dec 30, 2015 CLINICAL DATA:  Left-sided gaze.  Rule out stroke. EXAM: CT ANGIOGRAPHY HEAD TECHNIQUE: Multidetector CT imaging of the head was performed using the standard protocol during bolus administration of intravenous contrast. Multiplanar CT image reconstructions and  MIPs were obtained to evaluate the vascular anatomy. CONTRAST:  50 cc Isovue 370 intravenous COMPARISON:  Head CT from earlier today. FINDINGS: CTA HEAD Anterior circulation: Symmetric carotid arteries with tortuous cervical and cavernous segments. Small if any communicating arteries. Distal left M1 occlusion, beyond the anterior temporal branch. This has an acute appearance of embolus with meniscus. No other embolus is noted. Posterior circulation: Mild right vertebral artery dominance. Standard vertebrobasilar branching. No branch occlusion or flow limiting stenosis. Venous sinuses: Patent as permitted by contrast timing. Anatomic variants: None significant Delayed phase:No parenchymal enhancement. Infarct is now seen in the left insular cortex. Chronic small-vessel ischemic changes in cerebral white matter. These results were called by telephone at the time of interpretation on 12/30/15 at 11:23 am to Dr. Carmin Muskrat , who verbally acknowledged these results. These results were called by telephone at the time of interpretation on 30-Dec-2015 at 11:26 am to [place] who verbally acknowledged these results. IMPRESSION: 1. Acute distal left M1 occlusion. Nonhemorrhagic infarct is now visible in the left insula. 2. Elsewhere negative CTA with no notable atheromatous changes. Electronically Signed   By: Monte Fantasia M.D.   On: 12/30/2015 11:30   Dg Thoracic Spine 2 View  Result Date: 12/21/2015 CLINICAL DATA:  82 year old with persistent upper and low back pain. Personal history of multiple spine compression fractures. EXAM: THORACIC SPINE 2 VIEWS COMPARISON:  Chest x-rays 03/05/2015, 10/21/2014 and earlier. No prior thoracic spine imaging. FINDINGS: Twelve rib-bearing thoracic vertebrae. Since the November, 2016 examination, new compression fractures involving the T9 and T10 vertebrae, that at T9 approximately 80% and at T10 approximately 100%. Associated kyphous deformity at the T9-T10 level. Severe  osseous demineralization. Thoracic levoscoliosis. IMPRESSION:  Since the November, 2016 chest x-ray, new severe compression fractures at T9 and T10. Electronically Signed   By: Evangeline Dakin M.D.   On: 12/21/2015 17:17   Dg Lumbar Spine 2-3 Views  Result Date: 12/21/2015 CLINICAL DATA:  82 year old with persistent upper and low back pain. Personal history of multiple spine compression fractures. EXAM: LUMBAR SPINE - 2-3 VIEW COMPARISON:  Bone window images from CT abdomen and pelvis 11/04/2015, 02/02/2014. Lumbar spine CT 01/14/2013. FINDINGS: Prior L5 augmentation. Severe compression fracture at L1 on the order of 80-90%, unchanged since the most recent prior CT. Moderate compression fractures of L2 on the order of 50% and L3 on the order of 60-70%, also unchanged. No new lumbar compression fractures. Severe osseous demineralization. Lumbar dextroscoliosis. IMPRESSION: No acute osseous abnormalities. Stable fractures of L1, L2 and L3 since the bone window images from CT abdomen and pelvis 11/04/2015. Prior L5 augmentation. Electronically Signed   By: Evangeline Dakin M.D.   On: 12/21/2015 17:22   Ct Head Wo Contrast  Result Date: 12/30/2015 CLINICAL DATA:  Left-sided gaze. EXAM: CT HEAD WITHOUT CONTRAST TECHNIQUE: Contiguous axial images were obtained from the base of the skull through the vertex without intravenous contrast. COMPARISON:  05/27/2007 FINDINGS: Brain: No evidence of acute infarction, hemorrhage, extra-axial collection, ventriculomegaly, or mass effect. Generalized cerebral atrophy. Periventricular white matter low attenuation likely secondary to microangiopathy. Vascular: Cerebrovascular atherosclerotic calcifications are noted. Relative hyperdense left M2 segment of the MCA which is more dense compared with the prior CT of 05/27/2007 and may reflect thrombus. Skull: Negative for fracture or focal lesion. Sinuses/Orbits: There is chronic complete opacification of the right sphenoid sinus  with cortical thickening consistent with longstanding chronic sinusitis. Visualized orbits are unremarkable. Other: None. IMPRESSION: 1. Relative hyperdense left M2 segment of the MCA which is more dense compared with the prior CT of 05/27/2007 and may reflect thrombus. If there is further clinical concern, recommend evaluation with a CTA of the brain. 2. Otherwise no acute intracranial pathology. Electronically Signed   By: Kathreen Devoid   On: 12/11/2015 09:47    Microbiology: Recent Results (from the past 240 hour(s))  MRSA PCR Screening     Status: None   Collection Time: 12/28/2015  8:34 PM  Result Value Ref Range Status   MRSA by PCR NEGATIVE NEGATIVE Final    Comment:        The GeneXpert MRSA Assay (FDA approved for NASAL specimens only), is one component of a comprehensive MRSA colonization surveillance program. It is not intended to diagnose MRSA infection nor to guide or monitor treatment for MRSA infections.      Labs: Basic Metabolic Panel:  Recent Labs Lab 12/09/2015 0919  NA 134*  K 4.5  CL 101  CO2 26  GLUCOSE 116*  BUN 17  CREATININE 0.85  CALCIUM 8.9   Liver Function Tests:  Recent Labs Lab 12/16/2015 0919  AST 26  ALT 19  ALKPHOS 140*  BILITOT 0.8  PROT 6.6  ALBUMIN 3.3*   CBC:  Recent Labs Lab 12/12/2015 0919  WBC 8.1  NEUTROABS 5.6  HGB 13.0  HCT 39.7  MCV 93.6  PLT 275   Cardiac Enzymes:  Recent Labs Lab 12/30/2015 0919  TROPONINI <0.03   CBG:  Recent Labs Lab 01/01/2016 0915  GLUCAP 113*   Anemia work up No results for input(s): VITAMINB12, FOLATE, FERRITIN, TIBC, IRON, RETICCTPCT in the last 72 hours. Urinalysis    Component Value Date/Time   COLORURINE YELLOW 12/25/2015 [id number]  APPEARANCEUR CLEAR 12/22/2015 0958   APPEARANCEUR Hazy 02/02/2014 1608   LABSPEC 1.011 12/14/2015 0958   LABSPEC 1.012 02/02/2014 1608   PHURINE 7.5 01/03/2016 0958   GLUCOSEU NEGATIVE 12/21/2015 0958   GLUCOSEU Negative 02/02/2014 1608   HGBUR  NEGATIVE 01/03/2016 0958   BILIRUBINUR NEGATIVE 12/08/2015 0958   BILIRUBINUR neg 09/19/2014 1117   BILIRUBINUR Negative 02/02/2014 1608   KETONESUR NEGATIVE 12/12/2015 0958   PROTEINUR NEGATIVE 12/13/2015 0958   UROBILINOGEN 0.2 09/19/2014 1117   NITRITE NEGATIVE 12/24/2015 0958   LEUKOCYTESUR NEGATIVE 01/03/2016 0958   LEUKOCYTESUR 3+ 02/02/2014 1608    [facility] January 11, 2016, 3:18 PM

## 2016-01-06 NOTE — Progress Notes (Signed)
Nutrition Brief Note  Chart reviewed due to low Braden score. Pt now transitioning to comfort care.  No nutrition interventions warranted at this time.  Please consult as needed.   Kimberly Harris, RD, LDN, CNSC Pager [phone] After Hours Pager [phone]    

## 2016-01-06 NOTE — Consult Note (Signed)
Consultation Note Date: 01/21/16   Patient Name: Lori Henson  DOB: 1920/02/02  MRN: [id number]  Age / Sex: 82 y.o., female  PCP: Einar Pheasant, MD Referring Physician: Bonnielee Haff, MD  Reason for Consultation: Hospice Evaluation  HPI/Patient Profile: 82 y.o. female  with past medical history of atrial fibrillation, tachybradycardia syndrome s/p pacemaker, depression, anxiety, hypertension, malignant melanoma in 2008, and recent thoracic compression fracture admitted on 12/28/2015 with strokelike symptoms. Per daughter, a home health nurse found the patient on her back flaccid and unable to talk with a right sided gaze. Transported to the ED via EMS. CT/CTA showed massive left ischemic MCA. Per daughter at bedside, patient's husband has recently died and patient has been very depression. Patient has expressed previous wishes to family that she would not want to be resuscitated or on life support, therefore daughter would like to focus on comfort measures only. Palliative Medicine team consulted.   Clinical Assessment and Goals of Care: Upon arrival to room, patient appears comfortable on Morphine at 1mg /hr. She is unresponsive. Breathing is regular with no signs of distress. Lower extremities are cool and she is starting to mottle. Per RN, daughter has gone home to rest. Two sons live out of town.   Spoke with daughter, [name] via telephone and provided an update. She tells me that the patient's husband died in 2022-09-01. He was 33 years old. 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 [name] tells me she has struggled with chronic pain from osteoporosis and recent compression fractures. The patient had told her daughter she was ready to go and wishes should would just fall asleep. Also that this was not a good way to live life, in pain. [name] tells me that the family is aware that she is at the end of her life and they  wish for her to be comfortable.   NEXT OF KIN-daughter, [name]   SUMMARY OF RECOMMENDATIONS    DNR/DNI  Continue Morphine 1mg /hr. Titrate if needed.   Anticipate hospital death. Will contact family and discuss hospice tomorrow (9/21) if appropriate.   PMT will continue to follow and offer recommendations  Code Status/Advance Care Planning:  DNR   Symptom Management:   Robinul prn secretions  Haldol prn agitation  Ativan prn anxiety  Continue morphine gtt. Titrate as needed for signs of pain, distress, or dyspnea.   Palliative Prophylaxis:   Aspiration, Delirium Protocol, Frequent Pain Assessment, Oral Care and Turn Reposition  Additional Recommendations (Limitations, Scope, Preferences):  Full Comfort Care  Psycho-social/Spiritual:   Desire for further Chaplaincy support:no  Additional Recommendations: Caregiving  Support/Resources and Education on Hospice  Prognosis:   Hours - Days  Discharge Planning: Anticipated Hospital Death      Primary Diagnoses: Present on Admission: . Stroke ([place]) . Acute ischemic left MCA stroke ([place]) . Artificial cardiac pacemaker   I have reviewed the medical record, interviewed the patient and family, and examined the patient. The following aspects are pertinent.  Past Medical History:  Diagnosis Date  . Angular cheilitis   .  Anxiety   . Atrial flutter ([place])   . Bradycardia 2007   s/p pacemaker  . Dysrhythmia   . GERD (gastroesophageal reflux disease)   . Hypertension   . Lichen planus   . Malignant melanoma ([place]) 2001   right leg, s/p radiation, recurrence 2008  . Osteoporosis    fosamax, compression fx s/p kyphoplasty  . Presence of permanent cardiac pacemaker    Social History   Social History  . Marital status: Widowed    Spouse name: N/A  . Number of children: 4  . Years of education: N/A   Social History Main Topics  . Smoking status: Never Smoker  . Smokeless tobacco: Never Used  . Alcohol use  No  . Drug use: No  . Sexual activity: No   Other Topics Concern  . None   Social History Narrative  . None   Family History  Problem Relation Age of Onset  . Heart disease Mother   . Heart disease Father     died age 63  . Stroke Sister   . Kidney disease Brother   . Uterine cancer Maternal Aunt   . Ovarian cancer      cousin   Scheduled Meds:  Continuous Infusions: . morphine 1 mg/hr (12/13/2015 1617)   PRN Meds:.acetaminophen **OR** acetaminophen, antiseptic oral rinse, glycopyrrolate **OR** glycopyrrolate **OR** glycopyrrolate, haloperidol **OR** haloperidol **OR** haloperidol lactate, LORazepam **OR** LORazepam **OR** LORazepam, ondansetron **OR** ondansetron (ZOFRAN) IV, polyvinyl alcohol Medications Prior to Admission:  Prior to Admission medications   Medication Sig Start Date End Date Taking? Authorizing Provider  aspirin EC 81 MG tablet Take 81 mg by mouth daily.    Historical Provider, MD  calcium carbonate (TUMS EX) 750 MG chewable tablet Chew 1 tablet by mouth 2 (two) times daily.    Historical Provider, MD  carvedilol (COREG) 12.5 MG tablet Take 12.5 mg by mouth 2 (two) times daily with a meal.  11/30/15   Historical Provider, MD  Casanthranol-Docusate Sodium 30-100 MG CAPS Take 1 capsule by mouth as needed.     Historical Provider, MD  Cholecalciferol ([employer] VITAMIN D3) 1000 UNITS capsule Take 1,000 Units by mouth daily.    Historical Provider, MD  clonazePAM (KLONOPIN) 0.5 MG tablet Take 0.5 tablets (0.25 mg total) by mouth at bedtime. Patient not taking: Reported on 01/02/2016 12/18/15   Rainey Pines, MD  lactulose (CHRONULAC) 10 GM/15ML solution Take 45 mLs (30 g total) by mouth 2 (two) times daily. Patient not taking: Reported on 01/05/2016 11/09/15   Bettey Costa, MD  methocarbamol (ROBAXIN) 500 MG tablet Take 500 mg by mouth 2 (two) times daily.    Historical Provider, MD  metoprolol (LOPRESSOR) 50 MG tablet Take 1 tablet (50 mg total) by mouth 2 (two) times  daily. Patient not taking: Reported on 01/04/2016 11/09/15   Bettey Costa, MD  mirtazapine (REMERON) 30 MG tablet Take 1 tablet (30 mg total) by mouth at bedtime. Patient not taking: Reported on 12/28/2015 12/18/15   Rainey Pines, MD  Multiple Vitamins-Minerals (ICAPS) CAPS Take by mouth 2 (two) times daily.     Historical Provider, MD  sorbitol 70 % SOLN use if needed Patient not taking: Reported on 12/27/2015 07/11/15   Einar Pheasant, MD  venlafaxine XR (EFFEXOR-XR) 75 MG 24 hr capsule Take 1 capsule (75 mg total) by mouth daily with breakfast. Patient not taking: Reported on 12/30/2015 12/18/15   Rainey Pines, MD   Allergies  Allergen Reactions  . Macrobid [Nitrofurantoin Macrocrystal] Itching  .  Nitrofurantoin Itching and Other (See Comments)  . Ranitidine Hcl Diarrhea  . Zantac [Ranitidine Hcl] Diarrhea   Review of Systems  Unable to perform ROS: Patient unresponsive    Physical Exam  Constitutional: She appears lethargic.  Eyes: Right pupil is not reactive. Left pupil is not reactive.  pinpoint  Cardiovascular: Regular rhythm and normal heart sounds.   Pulmonary/Chest: No respiratory distress. She has rhonchi.  Regular rate and rhythm    Abdominal: Soft. Bowel sounds are normal. There is no tenderness.  Neurological: She appears lethargic.  Skin:  BUE warm/dry. BLE cool, starting to mottle  Nursing note and vitals reviewed.   Vital Signs: BP (!) 91/44 (BP Location: Left Arm)   Pulse 60   Temp 97.2 F (36.2 C) (Axillary)   Resp 18   SpO2 (!) 77%  Pain Assessment: Faces     SpO2: SpO2: (!) 77 % O2 Device:SpO2: (!) 77 % O2 Flow Rate: .O2 Flow Rate (L/min): 2 L/min  IO: Intake/output summary:   Intake/Output Summary (Last 24 hours) at 2015-12-27 [id number] Last data filed at Dec 27, 2015 [id number]  Gross per 24 hour  Intake                0 ml  Output             1400 ml  Net            -1400 ml    LBM:   Baseline Weight:   Most recent weight:       Palliative Assessment/Data: PPS  10%   Flowsheet Rows   Flowsheet Row Most Recent Value  Intake Tab  Referral Department  Hospitalist  Unit at Time of Referral  ER  Palliative Care Primary Diagnosis  Nephrology  Date Notified  12/08/2015  Palliative Care Type  New Palliative care  Reason for referral  End of Life Care Assistance, [facility]  Date of Admission  12/28/2015  Date first seen by Palliative Care  27-Dec-2015  # of days Palliative referral response time  1 Day(s)  # of days IP prior to Palliative referral  0  Clinical Assessment  Palliative Performance Scale Score  10%  Psychosocial & Spiritual Assessment  Palliative Care Outcomes  Patient/Family meeting held?  No      Time In: 0745 Time Out: 0855 Time Total: 43min Greater than 50%  of this time was spent counseling and coordinating care related to the above assessment and plan.  Signed by:  Ihor Dow, FNP-C Palliative Medicine Team  Phone: [phone] Fax: [phone]   Addendum Discussed in detail  Continue comfort measures Anticipate hospital death Agree with above note  Loistine Chance MD  [facility] health palliative medicine team [phone] pager  Please contact Palliative Medicine Team phone at [phone] for questions and concerns.  For individual provider: See Shea Evans

## 2016-01-06 DEATH — deceased

## 2016-02-13 ENCOUNTER — Ambulatory Visit: Payer: Medicare Other | Admitting: Psychiatry

## 2016-02-15 ENCOUNTER — Ambulatory Visit: Payer: Medicare Other | Admitting: Internal Medicine

## 2016-06-12 IMAGING — CR DG CHEST 1V PORT
1 series · 1 of 1 positions shown · non-contrast
Comparison: 02/02/2014

CLINICAL DATA: Bilateral lower lobe pneumonia

EXAM:
PORTABLE CHEST - 1 VIEW

[ap]
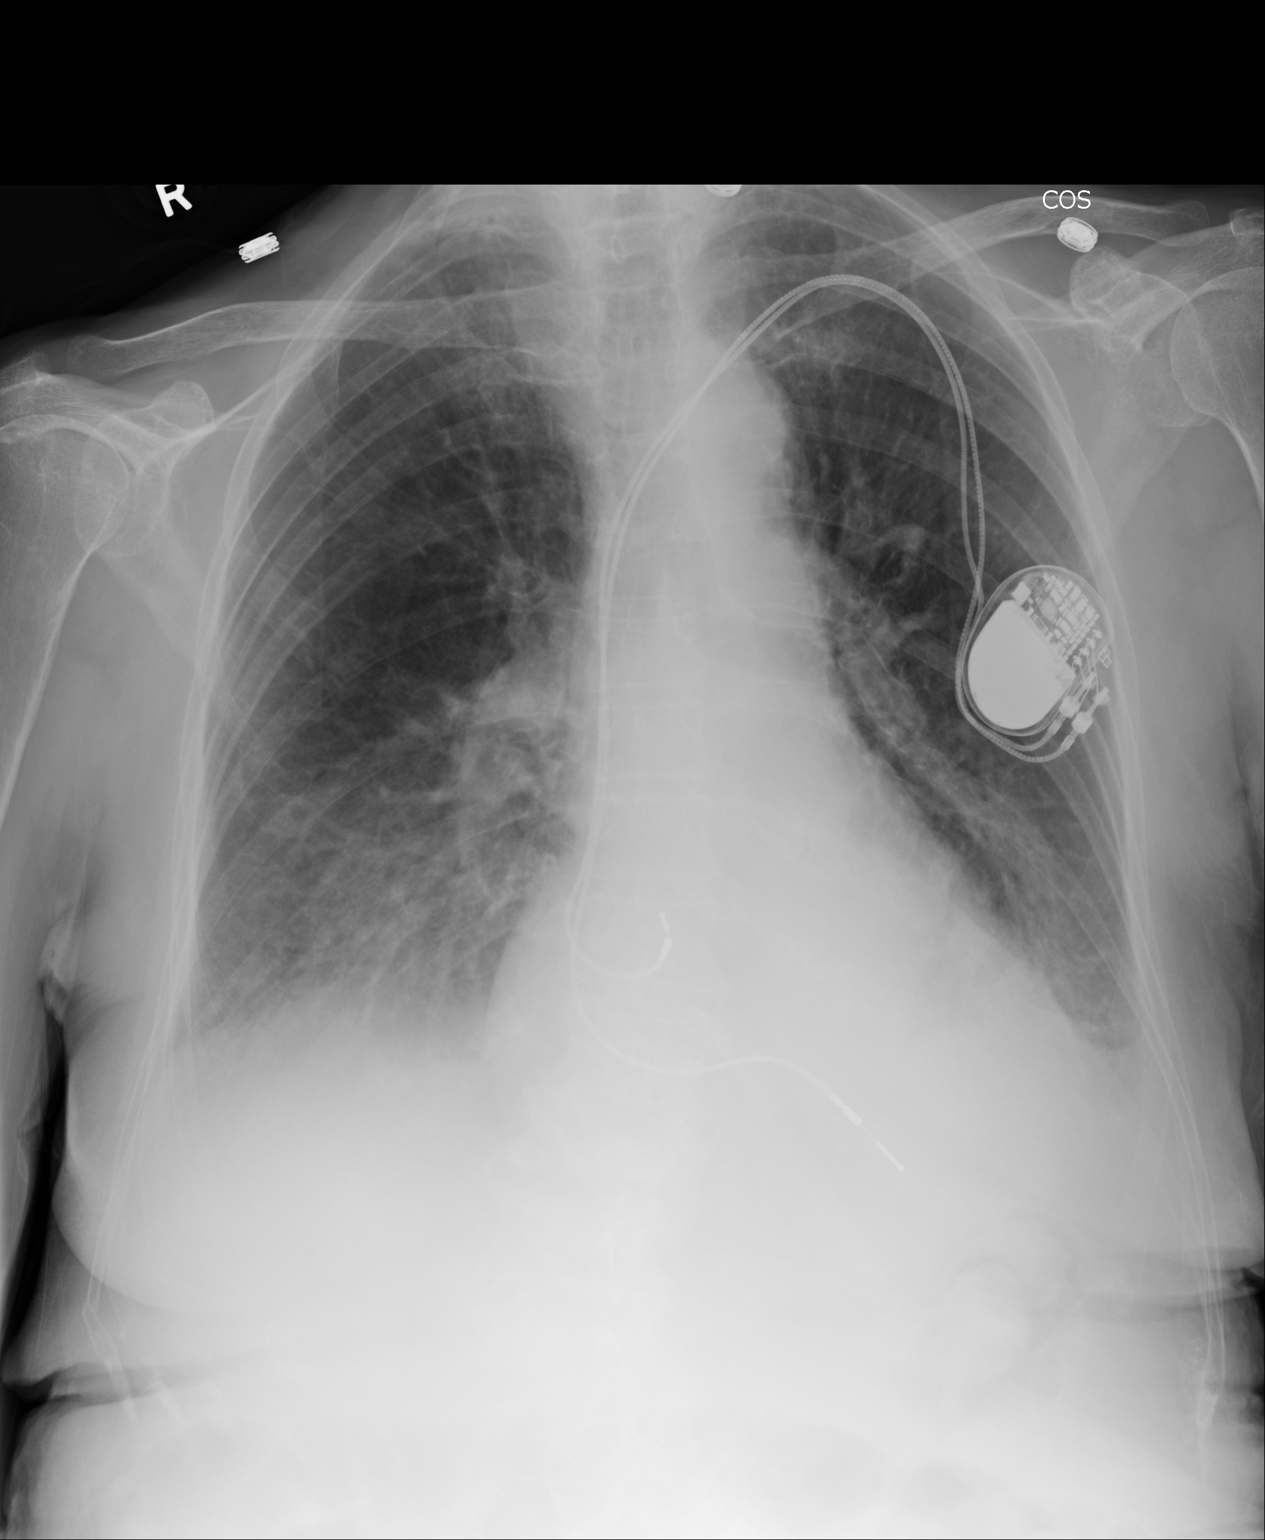

[1 of 1 positions shown; findings below may reference images not displayed]

FINDINGS: Left chest wall pacer device is noted with lead in the right atrial
appendage and right ventricle. The heart size is mildly enlarged.
Increase in pleural effusions and interstitial edema. There is
persistent bilateral lower lobe opacities.
IMPRESSION: 1. Increase in pleural effusions and edema.
2. No change in bilateral lower lobe pneumonia.
